# Patient Record
Sex: Male | Born: 1960
Health system: Southern US, Community
[De-identification: ages and names within clinical notes are randomized; demographics above are authoritative.]

## PROBLEM LIST (undated history)

## (undated) DIAGNOSIS — I10 Essential (primary) hypertension: Secondary | ICD-10-CM

## (undated) DIAGNOSIS — M7500 Adhesive capsulitis of unspecified shoulder: Secondary | ICD-10-CM

## (undated) DIAGNOSIS — E7800 Pure hypercholesterolemia, unspecified: Secondary | ICD-10-CM

## (undated) HISTORY — DX: Essential (primary) hypertension: I10

## (undated) HISTORY — PX: KNEE SURGERY: SHX244

## (undated) HISTORY — DX: Adhesive capsulitis of unspecified shoulder: M75.00

## (undated) HISTORY — DX: Pure hypercholesterolemia, unspecified: E78.00

## (undated) HISTORY — PX: APPENDECTOMY: SHX54

---

## 2004-12-27 ENCOUNTER — Ambulatory Visit: Payer: Self-pay | Admitting: Orthopedic Surgery

## 2005-07-08 ENCOUNTER — Ambulatory Visit: Payer: Self-pay | Admitting: Orthopedic Surgery

## 2010-10-09 ENCOUNTER — Encounter (INDEPENDENT_AMBULATORY_CARE_PROVIDER_SITE_OTHER): Payer: BC Managed Care – PPO | Admitting: Vascular Surgery

## 2010-10-09 ENCOUNTER — Encounter (INDEPENDENT_AMBULATORY_CARE_PROVIDER_SITE_OTHER): Payer: BC Managed Care – PPO

## 2010-10-09 DIAGNOSIS — R609 Edema, unspecified: Secondary | ICD-10-CM

## 2010-10-09 DIAGNOSIS — I70219 Atherosclerosis of native arteries of extremities with intermittent claudication, unspecified extremity: Secondary | ICD-10-CM

## 2010-10-10 NOTE — Consult Note (Signed)
NEW PATIENT CONSULTATION  Derk, Doubek Donovyn DOB:  1961-08-04                                       10/09/2010 YNWGN#:[id number]  Patient is a 50 year old male referred for evaluation of possible venous disease in both lower extremities, left worse than right.  This patient states that his left leg began swelling more about a year ago with no particular episode of trauma or other circumstances preceding this.  He has no history of DVT, superficial thrombophlebitis, varicose veins, but did have an ulceration develop in the lateral aspect of his leg about 10 months ago, treated by topical treatment.  I am unclear about exactly what the treatment consisted of, but shortly after that, the patient began developing thickening of the skin and a progressive edema, which has continued over the last several months.  He has not had previous vascular evaluation, has no history of DVT, superficial thrombophlebitis, pulmonary emboli, or other medical problems.  CHRONIC MEDICAL PROBLEMS: 1. Obesity. 2. Hypertension. 3. Hyperlipidemia. 4. Type 1 diabetes mellitus. 5. Arthritis, left knee. 6. Negative for coronary artery disease or stroke.  SOCIAL HISTORY:  He is married and has 1 child.  Does not use tobacco or alcohol.  FAMILY HISTORY:  Positive for diabetes in his mother.  Negative for coronary artery disease and stroke.  REVIEW OF SYSTEMS:  Positive for arthritis in his right knee.  Negative for chest pain, dyspnea on exertion, PND, orthopnea.  No chronic bronchitis, productive cough, wheezing.  All other systems in his complete review of systems are negative.  PHYSICAL EXAMINATION:  Blood pressure 164/94, heart rate 82, respirations 24.  General:  He is an obese, middle-aged male who is no apparent distress, alert and oriented x3.  HEENT:  Normal for age.  EOMs intact.  Lungs:  Clear to auscultation.  No rhonchi or wheezing. Cardiovascular:  Regular rhythm.  No murmurs.   Carotid pulses are 3+. No audible bruits.  Abdomen:  Soft, nontender with no masses. Musculoskeletal:  Free of major deformities.  Neurologic:  Normal.  Skin exam reveals a very unusual, thickened, hypertrophic rash in the left leg beginning in the distal thigh laterally and extending circumferentially down to just proximal to the ankle.  No stasis ulcers are noted.  The right leg has edema and thickening but to a much less degree than the left.  The hypertrophic changes in the skin are not present on the right side but are severely present on the left, and the skin is very firm.  He has intact femoral, popliteal, and dorsalis pedis pulses are 3+ bilaterally.  Circumference of the left leg is 6 cm greater than the circumference of the right leg below the knee.  Today I ordered a venous duplex exam which reveals no evidence of DVT bilaterally and no evidence of any incompetency of the great saphenous veins bilaterally.  I do not know the etiology of this skin change, which is severe on the left side, but he has no evidence of venous insufficiency or arterial insufficiency.  I think he should be evaluated very thoroughly by a dermatologist, as this appears that it could be some type of contact dermatitis which has severe skin changes, although I have never seen skin changes quite like this.  I have recommended dermatologic evaluation.    Quita Skye Hart Rochester, M.D. Electronically Signed  JDL/MEDQ  D:  10/09/2010  T:  10/10/2010  Job:  [id number]  cc:   Ernestina Penna, M.D. Paulene Floor, NP

## 2010-10-16 NOTE — Procedures (Unsigned)
DUPLEX DEEP VENOUS EXAM - LOWER EXTREMITY  INDICATION:  Vascular changes to lower extremities with edema.  HISTORY:  Edema:  Yes. Trauma/Surgery:  No. Pain:  No. PE:  No. Previous DVT:  No. Anticoagulants:  No. Other:  DUPLEX EXAM:               CFV   SFV   PopV  PTV    GSV               R  L  R  L  R  L  R   L  R  L Thrombosis    o  o  o  o  o  o         o  o Spontaneous   +  +  +  +  +  +         +  + Phasic        +  +  +  +  +  +         +  + Augmentation  +  +  +  +  +  +         +  + Compressible  +  +  +  +  +  +         +  + Competent     +  +  +  +  +  +         +  +  Legend:  + - yes  o - no  p - partial  D - decreased   IMPRESSION: 1. No evidence of lower extremity deep venous thrombosis bilaterally. 2. Unable to image bilateral distal thighs and calves due to edema,     lichenification and patient body habitus. 3. No evidence of greater saphenous vein incompetency bilaterally.        _____________________________ Quita Skye Hart [place], M.D.  EM/MEDQ  D:  10/09/2010  T:  10/09/2010  Job:  [id number]

## 2011-05-20 ENCOUNTER — Telehealth: Payer: Self-pay

## 2011-05-20 NOTE — Telephone Encounter (Signed)
LMOM for pt to call. (Needs OV first )

## 2011-05-21 NOTE — Telephone Encounter (Signed)
LMOM to call. Mailing a letter for pt to call and get appt for OV first.

## 2011-05-28 ENCOUNTER — Ambulatory Visit: Payer: BC Managed Care – PPO | Admitting: Urgent Care

## 2011-05-30 ENCOUNTER — Encounter: Payer: Self-pay | Admitting: Gastroenterology

## 2011-05-30 ENCOUNTER — Ambulatory Visit (INDEPENDENT_AMBULATORY_CARE_PROVIDER_SITE_OTHER): Payer: BC Managed Care – PPO | Admitting: Gastroenterology

## 2011-05-30 VITALS — BP 147/87 | HR 85 | Temp 97.9°F | Ht 73.0 in | Wt >= 6400 oz

## 2011-05-30 DIAGNOSIS — Z1211 Encounter for screening for malignant neoplasm of colon: Secondary | ICD-10-CM

## 2011-05-30 NOTE — Progress Notes (Signed)
Cc to PCP 

## 2011-05-30 NOTE — Patient Instructions (Addendum)
We have set you up for a colonoscopy with Dr. Jena Gauss.  Further recommendations to follow when this is completed.   Take a 1/2 dose of Lantus the evening before the procedure. Do not take Metformin the morning of procedure.

## 2011-05-30 NOTE — Progress Notes (Signed)
Referring Provider: Monica Becton, MD Primary Care Physician:  Monica Becton, MD, MD Primary Gastroenterologist:  Dr. Jena Gauss  Chief Complaint  Patient presents with  . Colonoscopy    HPI:   Jon Moore presents today for an initial screening colonoscopy. He is quite nervous about this procedure, but he has had several surgical procedures in the past. His BMI is approximately 55. He denies abdominal pain. No change in bowel habits. No melena or hematochezia. No nausea or vomiting. No prior FH of colon cancer. No upper GI symptoms.    Past Medical History  Diagnosis Date  . Hypertension   . Diabetes mellitus   . Hypercholesterolemia     Past Surgical History  Procedure Date  . Appendectomy   . Knee surgery     right    Current Outpatient Prescriptions  Medication Sig Dispense Refill  . amLODipine-olmesartan (AZOR) 10-40 MG per tablet Take 1 tablet by mouth daily.        . carvedilol (COREG) 25 MG tablet Take 25 mg by mouth 2 (two) times daily with a meal.        . cloNIDine (CATAPRES) 0.1 MG tablet Take 0.1 mg by mouth 2 (two) times daily.        . furosemide (LASIX) 80 MG tablet Take 80 mg by mouth 2 (two) times daily.        . insulin glargine (LANTUS) 100 UNIT/ML injection Inject 80 Units into the skin at bedtime.        . metFORMIN (GLUCOPHAGE) 1000 MG tablet Take 1,000 mg by mouth 2 (two) times daily with a meal.        . rosuvastatin (CRESTOR) 5 MG tablet Take 5 mg by mouth daily.          Allergies as of 05/30/2011  . (No Known Allergies)    Family History  Problem Relation Age of Onset  . Colon cancer Neg Hx     History   Social History  . Marital Status: Married    Spouse Name: N/A    Number of Children: 1  . Years of Education: N/A   Occupational History  . [employer]    Social History Main Topics  . Smoking status: Never Smoker   . Smokeless tobacco: Not on file  . Alcohol Use: No  . Drug Use: No  .  Sexually Active: Not on file   Other Topics Concern  . Not on file   Social History Narrative  . No narrative on file    Review of Systems: Gen: Denies any fever, chills, loss of appetite, fatigue, weight loss. CV: Denies chest pain, heart palpitations, syncope, peripheral edema. Resp: Denies shortness of breath with rest, cough, wheezing GI: Denies dysphagia or odynophagia. Denies hematemesis, fecal incontinence, or jaundice.  GU : Denies urinary burning, urinary frequency, urinary incontinence.  MS: Denies joint pain, muscle weakness, cramps, limited movement Derm: Denies rash, itching, dry skin Psych: Denies depression, anxiety, confusion or memory loss  Heme: Denies bruising, bleeding, and enlarged lymph nodes.  Physical Exam: BP 147/87  Pulse 85  Temp(Src) 97.9 F (36.6 C) (Temporal)  Ht 6\' 1"  (1.854 m)  Wt 414 lb 3.2 oz (187.88 kg)  BMI 54.65 kg/m2 General:   Alert and oriented. Well-developed, well-nourished, pleasant and cooperative. Obese.  Head:  Normocephalic and atraumatic. Eyes:  Conjunctiva pink, sclera clear, no icterus.   Ears:  Normal auditory acuity. Nose:  No deformity,  discharge,  or lesions. Mouth:  No deformity or lesions, mucosa pink and moist.  Neck:  Supple, without mass or thyromegaly. Lungs:  Clear to auscultation bilaterally, without wheezing, rales, or rhonchi.  Heart:  S1, S2 present without murmurs noted.  Abdomen:  Largely obese, large AP diameter. Difficult to appreciate HSM due to body habitus. +BS, soft, non-tender and non-distended. No palpable hernias noted. Rectal:  Deferred  Msk:  Symmetrical without gross deformities. Normal posture. Extremities:  Without clubbing or edema. Neurologic:  Alert and  oriented x4;  grossly normal neurologically. Skin:  Intact, warm and dry without significant lesions or rashes Cervical Nodes:  No significant cervical adenopathy. Psych:  Alert and cooperative. Normal mood and affect.

## 2011-05-30 NOTE — Assessment & Plan Note (Signed)
50 year old pleasant male with need for initial screening colonoscopy. He is nervous about the procedure, specifically anesthesia. He has no lower GI symptoms at all. No FH of colon cancer. Due to his large body habitus and a BMI of nearly 55, I feel it is best he is done in the OR with the assistance of Propofol. It is likely he would need vigilant airway monitoring due to body habitus, and I discussed this with him. He stated understanding.  Proceed with TCS with Dr. Jena Gauss with the assistance of Propofol (BMI 55)  in near future: the risks, benefits, and alternatives have been discussed with the patient in detail. The patient states understanding and desires to proceed. 1/2 dose Lantus evening prior No Glucophage morning of procedure

## 2011-08-20 DIAGNOSIS — M7500 Adhesive capsulitis of unspecified shoulder: Secondary | ICD-10-CM

## 2011-08-20 HISTORY — DX: Adhesive capsulitis of unspecified shoulder: M75.00

## 2011-11-20 DIAGNOSIS — J9600 Acute respiratory failure, unspecified whether with hypoxia or hypercapnia: Secondary | ICD-10-CM | POA: Insufficient documentation

## 2011-11-20 DIAGNOSIS — Z794 Long term (current) use of insulin: Secondary | ICD-10-CM | POA: Insufficient documentation

## 2011-11-20 DIAGNOSIS — I878 Other specified disorders of veins: Secondary | ICD-10-CM | POA: Insufficient documentation

## 2011-11-20 DIAGNOSIS — E119 Type 2 diabetes mellitus without complications: Secondary | ICD-10-CM | POA: Insufficient documentation

## 2011-11-20 DIAGNOSIS — I10 Essential (primary) hypertension: Secondary | ICD-10-CM | POA: Insufficient documentation

## 2011-11-20 DIAGNOSIS — N179 Acute kidney failure, unspecified: Secondary | ICD-10-CM | POA: Insufficient documentation

## 2011-11-20 DIAGNOSIS — E1169 Type 2 diabetes mellitus with other specified complication: Secondary | ICD-10-CM | POA: Insufficient documentation

## 2011-11-20 DIAGNOSIS — L02419 Cutaneous abscess of limb, unspecified: Secondary | ICD-10-CM | POA: Insufficient documentation

## 2011-11-20 DIAGNOSIS — I152 Hypertension secondary to endocrine disorders: Secondary | ICD-10-CM | POA: Insufficient documentation

## 2011-11-20 DIAGNOSIS — E1122 Type 2 diabetes mellitus with diabetic chronic kidney disease: Secondary | ICD-10-CM | POA: Insufficient documentation

## 2011-11-21 DIAGNOSIS — E46 Unspecified protein-calorie malnutrition: Secondary | ICD-10-CM | POA: Insufficient documentation

## 2011-11-24 DIAGNOSIS — R609 Edema, unspecified: Secondary | ICD-10-CM | POA: Insufficient documentation

## 2011-11-28 DIAGNOSIS — A419 Sepsis, unspecified organism: Secondary | ICD-10-CM | POA: Insufficient documentation

## 2012-07-21 ENCOUNTER — Ambulatory Visit: Payer: BC Managed Care – PPO | Attending: Orthopedic Surgery | Admitting: Physical Therapy

## 2012-07-21 DIAGNOSIS — IMO0001 Reserved for inherently not codable concepts without codable children: Secondary | ICD-10-CM | POA: Insufficient documentation

## 2012-07-21 DIAGNOSIS — M25519 Pain in unspecified shoulder: Secondary | ICD-10-CM | POA: Insufficient documentation

## 2012-07-21 DIAGNOSIS — M25619 Stiffness of unspecified shoulder, not elsewhere classified: Secondary | ICD-10-CM | POA: Insufficient documentation

## 2012-07-21 DIAGNOSIS — R5381 Other malaise: Secondary | ICD-10-CM | POA: Insufficient documentation

## 2012-07-23 ENCOUNTER — Ambulatory Visit: Payer: BC Managed Care – PPO | Admitting: Physical Therapy

## 2012-07-27 ENCOUNTER — Ambulatory Visit: Payer: BC Managed Care – PPO | Admitting: *Deleted

## 2012-07-28 ENCOUNTER — Ambulatory Visit: Payer: BC Managed Care – PPO | Admitting: Physical Therapy

## 2012-07-30 ENCOUNTER — Ambulatory Visit: Payer: BC Managed Care – PPO | Admitting: Physical Therapy

## 2012-08-04 ENCOUNTER — Ambulatory Visit: Payer: BC Managed Care – PPO | Admitting: Physical Therapy

## 2012-08-06 ENCOUNTER — Ambulatory Visit: Payer: BC Managed Care – PPO | Admitting: Physical Therapy

## 2012-08-10 ENCOUNTER — Ambulatory Visit: Payer: BC Managed Care – PPO | Admitting: *Deleted

## 2012-08-13 ENCOUNTER — Ambulatory Visit: Payer: BC Managed Care – PPO | Admitting: Physical Therapy

## 2012-08-25 ENCOUNTER — Ambulatory Visit: Payer: BC Managed Care – PPO | Attending: Orthopedic Surgery | Admitting: Physical Therapy

## 2012-08-25 DIAGNOSIS — R5381 Other malaise: Secondary | ICD-10-CM | POA: Insufficient documentation

## 2012-08-25 DIAGNOSIS — M25519 Pain in unspecified shoulder: Secondary | ICD-10-CM | POA: Insufficient documentation

## 2012-08-25 DIAGNOSIS — M25619 Stiffness of unspecified shoulder, not elsewhere classified: Secondary | ICD-10-CM | POA: Insufficient documentation

## 2012-08-25 DIAGNOSIS — IMO0001 Reserved for inherently not codable concepts without codable children: Secondary | ICD-10-CM | POA: Insufficient documentation

## 2012-08-27 ENCOUNTER — Ambulatory Visit: Payer: BC Managed Care – PPO | Admitting: Physical Therapy

## 2012-09-01 ENCOUNTER — Ambulatory Visit: Payer: BC Managed Care – PPO | Admitting: Physical Therapy

## 2012-09-03 ENCOUNTER — Ambulatory Visit: Payer: BC Managed Care – PPO | Admitting: Physical Therapy

## 2012-09-08 ENCOUNTER — Ambulatory Visit: Payer: BC Managed Care – PPO | Admitting: Physical Therapy

## 2012-09-10 ENCOUNTER — Ambulatory Visit: Payer: BC Managed Care – PPO | Admitting: Physical Therapy

## 2012-09-15 ENCOUNTER — Ambulatory Visit: Payer: BC Managed Care – PPO | Admitting: Physical Therapy

## 2012-09-17 ENCOUNTER — Ambulatory Visit: Payer: BC Managed Care – PPO | Admitting: Physical Therapy

## 2013-01-02 ENCOUNTER — Other Ambulatory Visit: Payer: Self-pay | Admitting: Physician Assistant

## 2013-01-13 ENCOUNTER — Ambulatory Visit (INDEPENDENT_AMBULATORY_CARE_PROVIDER_SITE_OTHER): Payer: BC Managed Care – PPO | Admitting: Physician Assistant

## 2013-01-13 ENCOUNTER — Encounter: Payer: Self-pay | Admitting: Physician Assistant

## 2013-01-13 VITALS — BP 158/84 | HR 74 | Temp 99.2°F | Ht 74.0 in | Wt >= 6400 oz

## 2013-01-13 DIAGNOSIS — E119 Type 2 diabetes mellitus without complications: Secondary | ICD-10-CM

## 2013-01-13 DIAGNOSIS — R600 Localized edema: Secondary | ICD-10-CM

## 2013-01-13 DIAGNOSIS — I10 Essential (primary) hypertension: Secondary | ICD-10-CM

## 2013-01-13 DIAGNOSIS — M25519 Pain in unspecified shoulder: Secondary | ICD-10-CM

## 2013-01-13 DIAGNOSIS — R609 Edema, unspecified: Secondary | ICD-10-CM

## 2013-01-13 DIAGNOSIS — M25511 Pain in right shoulder: Secondary | ICD-10-CM

## 2013-01-13 NOTE — Progress Notes (Signed)
Subjective:     Patient ID: Jon Moore, male   DOB: 14-Jun-1961, 52 y.o.   MRN: [id number]  HPI Pt here for review of frozen R shoulder Pt had extensive lower ext cellulitis and almost had BKA to the R leg During a prolonged hosp stay pt had frozen R shoulder Since d/c he has seen PT and completed course with them He has cont with ROM and strengthening exercises at home and is doing well. States he almost has full function back Pt has returned to work He continues with lower ext  edema but is wearing his TED hose as instructed  Review of Systems  All other systems reviewed and are negative.      Objective:   Physical Exam    Pt with almost FROM of the R shoulder No TTP of the entire joint FROM elbow/wrist Good grip strength Pulses/sensory good R lower ext with cont edema but pt wearing TED hose  Assessment:     1. Pain in joint, shoulder region, right   2. Lower extremity edema        Plan:     Cont with home therapy Cont with TED hose Keep regular f/u regarding medical conditions

## 2013-01-13 NOTE — Patient Instructions (Signed)

## 2013-02-08 ENCOUNTER — Other Ambulatory Visit: Payer: Self-pay | Admitting: *Deleted

## 2013-02-08 MED ORDER — ROSUVASTATIN CALCIUM 5 MG PO TABS: 5.0000 mg | ORAL_TABLET | Freq: Every day | ORAL | Status: DC

## 2013-03-30 ENCOUNTER — Ambulatory Visit: Payer: BC Managed Care – PPO | Admitting: Family Medicine

## 2013-04-13 ENCOUNTER — Telehealth: Payer: Self-pay | Admitting: Family Medicine

## 2013-04-13 ENCOUNTER — Ambulatory Visit (INDEPENDENT_AMBULATORY_CARE_PROVIDER_SITE_OTHER): Payer: BC Managed Care – PPO | Admitting: Family Medicine

## 2013-04-13 ENCOUNTER — Other Ambulatory Visit: Payer: Self-pay | Admitting: Family Medicine

## 2013-04-13 ENCOUNTER — Encounter: Payer: Self-pay | Admitting: Family Medicine

## 2013-04-13 VITALS — BP 179/99 | HR 76 | Temp 98.1°F | Ht 74.0 in | Wt >= 6400 oz

## 2013-04-13 DIAGNOSIS — E785 Hyperlipidemia, unspecified: Secondary | ICD-10-CM

## 2013-04-13 DIAGNOSIS — E119 Type 2 diabetes mellitus without complications: Secondary | ICD-10-CM

## 2013-04-13 DIAGNOSIS — I10 Essential (primary) hypertension: Secondary | ICD-10-CM

## 2013-04-13 DIAGNOSIS — R609 Edema, unspecified: Secondary | ICD-10-CM

## 2013-04-13 LAB — POCT CBC
Granulocyte percent: 61.2 %G (ref 37–80)
HCT, POC: 41.4 % — AB (ref 43.5–53.7)
Hemoglobin: 14 g/dL — AB (ref 14.1–18.1)
Lymph, poc: 2.5 (ref 0.6–3.4)
MCH, POC: 28.5 pg (ref 27–31.2)
MCHC: 33.7 g/dL (ref 31.8–35.4)
MCV: 78.5 fL — AB (ref 80–97)
MPV: 9 fL (ref 0–99.8)
POC Granulocyte: 4.7 (ref 2–6.9)
POC LYMPH PERCENT: 33.4 %L (ref 10–50)
Platelet Count, POC: 220 10*3/uL (ref 142–424)
RBC: 5.3 M/uL (ref 4.69–6.13)
RDW, POC: 14.9 %
WBC: 7.6 10*3/uL (ref 4.6–10.2)

## 2013-04-13 LAB — POCT UA - MICROALBUMIN: Microalbumin Ur, POC: NEGATIVE mg/L

## 2013-04-13 MED ORDER — CLONIDINE HCL 0.1 MG PO TABS: 0.1000 mg | ORAL_TABLET | Freq: Two times a day (BID) | ORAL | Status: DC

## 2013-04-13 MED ORDER — ROSUVASTATIN CALCIUM 5 MG PO TABS: 5.0000 mg | ORAL_TABLET | Freq: Every day | ORAL | Status: DC

## 2013-04-13 MED ORDER — INSULIN GLARGINE 100 UNITS/ML SOLOSTAR PEN: 70.0000 [IU] | PEN_INJECTOR | Freq: Every day | SUBCUTANEOUS | Status: DC

## 2013-04-13 MED ORDER — AMLODIPINE-OLMESARTAN 10-40 MG PO TABS: 1.0000 | ORAL_TABLET | Freq: Every day | ORAL | Status: DC

## 2013-04-13 MED ORDER — FUROSEMIDE 80 MG PO TABS: 80.0000 mg | ORAL_TABLET | Freq: Two times a day (BID) | ORAL | Status: DC

## 2013-04-13 MED ORDER — INSULIN GLARGINE 100 UNIT/ML ~~LOC~~ SOLN: 70.0000 [IU] | Freq: Every day | SUBCUTANEOUS | Status: DC

## 2013-04-13 MED ORDER — CARVEDILOL 25 MG PO TABS: 25.0000 mg | ORAL_TABLET | Freq: Two times a day (BID) | ORAL | Status: DC

## 2013-04-13 MED ORDER — METFORMIN HCL 1000 MG PO TABS: 1000.0000 mg | ORAL_TABLET | Freq: Two times a day (BID) | ORAL | Status: DC

## 2013-04-13 NOTE — Telephone Encounter (Signed)
Can we please fix he needs the pens called in

## 2013-04-13 NOTE — Patient Instructions (Signed)

## 2013-04-13 NOTE — Telephone Encounter (Signed)
solostar pens sent to [facility]

## 2013-04-13 NOTE — Progress Notes (Signed)
  Subjective:    Patient ID: Carrick Rijos, male    DOB: 1960-10-06, 52 y.o.   MRN: [id number]  HPI This 52 y.o. male presents for evaluation of diabetes, hypertension, edema, and hyperlipidemia. He has problems with obesity.  He was hospitalized last year due to cellulitis of the left lower Extremity, aspiration pneumonia, and sepsis.  He had respiratory failure and was on the  Ventilator 5 days.  He has been doing a lot better and his skin grafts on the right lower extremity Have healed.  He has been out of bp medicine for 2 days.  He states he has gained 45 pounds this year. He is speaking to a nutritionist through his insurance company with [employer].  He Denies any acute medical problems.   Review of Systems C/o edema bilateral lower extremities.   No chest pain, SOB, HA, dizziness, vision change, N/V, diarrhea, constipation, dysuria, urinary urgency or frequency, myalgias, arthralgias or rash.  Objective:   Physical Exam Vital signs noted  Well developed well nourished obese AA male.  HEENT - Head atraumatic Normocephalic                Eyes - PERRLA, Conjuctiva - clear Sclera- Clear EOMI                Ears - EAC's Wnl TM's Wnl Gross Hearing WNL                Nose - Nares patent                 Throat - oropharanx wnl Respiratory - Lungs CTA bilateral Cardiac - RRR S1 and S2 without murmur GI - Abdomen obese, soft, Nontender and bowel sounds active x 4 Extremities - Right lower extremity with healed skin grafts, anasarca edema bilateral lower  Extremities. Neuro - Grossly intact.        Assessment & Plan:  Diabetes - Plan: metFORMIN (GLUCOPHAGE) 1000 MG tablet, insulin glargine (LANTUS) 100 UNIT/ML injection, POCT CBC, CMP14+EGFR, Lipid panel, Thyroid Panel With TSH, POCT UA - Microalbumin  Essential hypertension, benign - Plan: cloNIDine (CATAPRES) 0.1 MG tablet, carvedilol (COREG) 25 MG tablet, amLODipine-olmesartan (AZOR) 10-40 MG per  tablet  Edema - Plan: furosemide (LASIX) 80 MG tablet, 40mg  bid, continue compression stockings.  Other and unspecified hyperlipidemia - Plan: rosuvastatin (CRESTOR) 5 MG tablet  Follow up in 4 months.

## 2013-04-14 LAB — CMP14+EGFR
ALT: 20 IU/L (ref 0–44)
AST: 18 IU/L (ref 0–40)
Albumin/Globulin Ratio: 1.3 (ref 1.1–2.5)
Albumin: 4.4 g/dL (ref 3.5–5.5)
Alkaline Phosphatase: 126 IU/L — ABNORMAL HIGH (ref 39–117)
BUN/Creatinine Ratio: 12 (ref 9–20)
BUN: 16 mg/dL (ref 6–24)
CO2: 31 mmol/L — ABNORMAL HIGH (ref 18–29)
Calcium: 9.8 mg/dL (ref 8.7–10.2)
Chloride: 100 mmol/L (ref 97–108)
Creatinine, Ser: 1.38 mg/dL — ABNORMAL HIGH (ref 0.76–1.27)
GFR calc Af Amer: 67 mL/min/{1.73_m2} (ref >59)
GFR calc non Af Amer: 58 mL/min/{1.73_m2} — ABNORMAL LOW (ref >59)
Globulin, Total: 3.5 g/dL (ref 1.5–4.5)
Glucose: 97 mg/dL (ref 65–99)
Potassium: 4.9 mmol/L (ref 3.5–5.2)
Sodium: 144 mmol/L (ref 134–144)
Total Bilirubin: 0.4 mg/dL (ref 0.0–1.2)
Total Protein: 7.9 g/dL (ref 6.0–8.5)

## 2013-04-14 LAB — THYROID PANEL WITH TSH
Free Thyroxine Index: 2.1 (ref 1.2–4.9)
T3 Uptake Ratio: 37 % (ref 24–39)
T4, Total: 5.8 ug/dL (ref 4.5–12.0)
TSH: 1.65 u[IU]/mL (ref 0.450–4.500)

## 2013-04-14 LAB — LIPID PANEL
Chol/HDL Ratio: 4 ratio units (ref 0.0–5.0)
Cholesterol, Total: 135 mg/dL (ref 100–199)
HDL: 34 mg/dL — ABNORMAL LOW (ref >39)
LDL Calculated: 70 mg/dL (ref 0–99)
Triglycerides: 153 mg/dL — ABNORMAL HIGH (ref 0–149)
VLDL Cholesterol Cal: 31 mg/dL (ref 5–40)

## 2013-04-15 ENCOUNTER — Telehealth: Payer: Self-pay | Admitting: Family Medicine

## 2013-04-16 NOTE — Telephone Encounter (Signed)
Taken care of in another encounter 

## 2013-04-20 ENCOUNTER — Telehealth: Payer: Self-pay | Admitting: Family Medicine

## 2013-04-20 ENCOUNTER — Encounter: Payer: Self-pay | Admitting: Family Medicine

## 2013-04-20 NOTE — Telephone Encounter (Signed)
Did you order an a1c on him?

## 2013-04-22 ENCOUNTER — Telehealth: Payer: Self-pay | Admitting: Family Medicine

## 2013-04-22 LAB — POCT GLYCOSYLATED HEMOGLOBIN (HGB A1C): Hemoglobin A1C: 7.7

## 2013-04-22 NOTE — Telephone Encounter (Signed)
hgbaic results are 7.7 and need to get down to 7.0 so would recommend he follow up with Tammy Eckerd  Pharm D to do diabetes visit.

## 2013-04-22 NOTE — Addendum Note (Signed)
Addended by: Prescott Gum on: 04/22/2013 09:00 AM   Modules accepted: Orders

## 2013-04-22 NOTE — Telephone Encounter (Signed)
Patient aware.

## 2013-04-29 NOTE — Telephone Encounter (Signed)
Pt needs rx for stockings.

## 2013-05-04 ENCOUNTER — Telehealth: Payer: Self-pay | Admitting: *Deleted

## 2013-05-04 NOTE — Telephone Encounter (Signed)
Jon Moore says you told him to take Carvedilol bid but only called in 30 for his monthly supply.  If he needs bid, can you send correct script to pharmacy for #60?

## 2013-05-05 ENCOUNTER — Other Ambulatory Visit: Payer: Self-pay | Admitting: Family Medicine

## 2013-05-05 DIAGNOSIS — I10 Essential (primary) hypertension: Secondary | ICD-10-CM

## 2013-05-05 MED ORDER — CARVEDILOL 25 MG PO TABS: 25.0000 mg | ORAL_TABLET | Freq: Two times a day (BID) | ORAL | Status: DC

## 2013-05-05 NOTE — Telephone Encounter (Signed)
rx fixed and sent to pharmacy

## 2013-05-26 ENCOUNTER — Telehealth: Payer: Self-pay | Admitting: Family Medicine

## 2013-05-26 NOTE — Telephone Encounter (Signed)
Pt has already seen ortho for this Informed pt that he could schedule since he's already established

## 2013-06-24 ENCOUNTER — Other Ambulatory Visit: Payer: Self-pay

## 2013-07-20 ENCOUNTER — Encounter: Payer: Self-pay | Admitting: Family Medicine

## 2013-07-20 ENCOUNTER — Ambulatory Visit (INDEPENDENT_AMBULATORY_CARE_PROVIDER_SITE_OTHER): Payer: BC Managed Care – PPO | Admitting: Family Medicine

## 2013-07-20 VITALS — BP 140/82 | HR 72 | Temp 96.8°F | Ht 74.0 in | Wt >= 6400 oz

## 2013-07-20 DIAGNOSIS — E119 Type 2 diabetes mellitus without complications: Secondary | ICD-10-CM

## 2013-07-20 DIAGNOSIS — I10 Essential (primary) hypertension: Secondary | ICD-10-CM

## 2013-07-20 LAB — POCT GLYCOSYLATED HEMOGLOBIN (HGB A1C): Hemoglobin A1C: 7.2

## 2013-07-20 NOTE — Patient Instructions (Signed)

## 2013-07-20 NOTE — Progress Notes (Signed)
   Subjective:    Patient ID: Jon Moore, male    DOB: [birth year], 52 y.o.   MRN: [id number]  HPI  This 52 y.o. male presents for evaluation of diabetes, hypertension, and hyperlipidemia. He has lost 30 pounds since last visit.  He has been controlling his diet.  He has been Seeing orthopedics for DJD of the knees.  His left is worse than his right and he Has had to cut back on work.  He is trying to avoid getting knee replacement as long as He can.  Review of Systems C/o left knee arthralgias.   No chest pain, SOB, HA, dizziness, vision change, N/V, diarrhea, constipation, dysuria, urinary urgency or frequency or rash.  Objective:   Physical Exam  Vital signs noted  Well developed well nourished obese male.  HEENT - Head atraumatic Normocephalic                Eyes - PERRLA, Conjuctiva - clear Sclera- Clear EOMI                Ears - EAC's Wnl TM's Wnl Gross Hearing WNL                Nose - Nares patent                 Throat - oropharanx wnl Respiratory - Lungs CTA bilateral Cardiac - RRR S1 and S2 without murmur GI - Abdomen soft Nontender and bowel sounds active x 4 Extremities - No edema. Neuro - Grossly intact.  Results for orders placed in visit on 07/20/13  POCT GLYCOSYLATED HEMOGLOBIN (HGB A1C)      Result Value Range   Hemoglobin A1C 7.2        Assessment & Plan:  HTN (hypertension) - Plan: CMP14+EGFR, Lipid panel  Diabetes - Plan: POCT glycosylated hemoglobin (Hb A1C), continue to follow diabetic diet And congrats on losing weight.  Hgbaic is slightly lower and this is an improvement.  Deatra Canter FNP

## 2013-07-21 ENCOUNTER — Telehealth: Payer: Self-pay | Admitting: *Deleted

## 2013-07-21 LAB — CMP14+EGFR
ALT: 20 IU/L (ref 0–44)
AST: 20 IU/L (ref 0–40)
Albumin/Globulin Ratio: 1.2 (ref 1.1–2.5)
Albumin: 4.2 g/dL (ref 3.5–5.5)
Alkaline Phosphatase: 120 IU/L — ABNORMAL HIGH (ref 39–117)
BUN/Creatinine Ratio: 15 (ref 9–20)
BUN: 18 mg/dL (ref 6–24)
CO2: 29 mmol/L (ref 18–29)
Calcium: 9.7 mg/dL (ref 8.7–10.2)
Chloride: 100 mmol/L (ref 97–108)
Creatinine, Ser: 1.24 mg/dL (ref 0.76–1.27)
GFR calc Af Amer: 76 mL/min/{1.73_m2} (ref >59)
GFR calc non Af Amer: 66 mL/min/{1.73_m2} (ref >59)
Globulin, Total: 3.6 g/dL (ref 1.5–4.5)
Glucose: 88 mg/dL (ref 65–99)
Potassium: 4.4 mmol/L (ref 3.5–5.2)
Sodium: 144 mmol/L (ref 134–144)
Total Bilirubin: 0.5 mg/dL (ref 0.0–1.2)
Total Protein: 7.8 g/dL (ref 6.0–8.5)

## 2013-07-21 LAB — LIPID PANEL
Chol/HDL Ratio: 4.4 ratio units (ref 0.0–5.0)
Cholesterol, Total: 127 mg/dL (ref 100–199)
HDL: 29 mg/dL — ABNORMAL LOW (ref >39)
LDL Calculated: 66 mg/dL (ref 0–99)
Triglycerides: 159 mg/dL — ABNORMAL HIGH (ref 0–149)
VLDL Cholesterol Cal: 32 mg/dL (ref 5–40)

## 2013-07-21 NOTE — Telephone Encounter (Signed)
Message copied by Baltazar Apo on Wed Jul 21, 2013 11:01 AM ------      Message from: Deatra Canter      Created: Wed Jul 21, 2013 10:13 AM       Lipids look good, hgbaic is better at 7.2% but no below 7 so continue diet and exercise and weight lossp ------

## 2013-07-21 NOTE — Telephone Encounter (Signed)
Lm to call back

## 2013-08-09 ENCOUNTER — Telehealth: Payer: Self-pay | Admitting: *Deleted

## 2013-08-09 NOTE — Telephone Encounter (Signed)
Pt notified and verbalized understanding.

## 2013-08-09 NOTE — Telephone Encounter (Signed)
Message copied by Baltazar Apo on Mon Aug 09, 2013  2:00 PM ------      Message from: Deatra Canter      Created: Wed Jul 21, 2013 10:13 AM       Lipids look good, hgbaic is better at 7.2% but no below 7 so continue diet and exercise and weight lossp ------

## 2013-08-20 ENCOUNTER — Encounter: Payer: Self-pay | Admitting: Family Medicine

## 2013-08-24 ENCOUNTER — Encounter: Payer: Self-pay | Admitting: Family Medicine

## 2013-09-22 ENCOUNTER — Ambulatory Visit: Payer: BC Managed Care – PPO | Admitting: Family Medicine

## 2013-09-28 ENCOUNTER — Ambulatory Visit: Payer: BC Managed Care – PPO | Admitting: Family Medicine

## 2013-10-19 ENCOUNTER — Ambulatory Visit (INDEPENDENT_AMBULATORY_CARE_PROVIDER_SITE_OTHER): Payer: BC Managed Care – PPO | Admitting: Family Medicine

## 2013-10-19 ENCOUNTER — Ambulatory Visit: Payer: BC Managed Care – PPO | Admitting: Family Medicine

## 2013-10-19 ENCOUNTER — Encounter: Payer: Self-pay | Admitting: Family Medicine

## 2013-10-19 VITALS — BP 152/80 | HR 85 | Temp 97.7°F | Ht 74.0 in | Wt >= 6400 oz

## 2013-10-19 DIAGNOSIS — E785 Hyperlipidemia, unspecified: Secondary | ICD-10-CM

## 2013-10-19 DIAGNOSIS — E119 Type 2 diabetes mellitus without complications: Secondary | ICD-10-CM

## 2013-10-19 DIAGNOSIS — M129 Arthropathy, unspecified: Secondary | ICD-10-CM

## 2013-10-19 DIAGNOSIS — Z0000 Encounter for general adult medical examination without abnormal findings: Secondary | ICD-10-CM

## 2013-10-19 DIAGNOSIS — M1990 Unspecified osteoarthritis, unspecified site: Secondary | ICD-10-CM

## 2013-10-19 LAB — POCT CBC
Granulocyte percent: 68.7 %G (ref 37–80)
HCT, POC: 44.7 % (ref 43.5–53.7)
Hemoglobin: 14.5 g/dL (ref 14.1–18.1)
Lymph, poc: 2.2 (ref 0.6–3.4)
MCH, POC: 25.9 pg — AB (ref 27–31.2)
MCHC: 32.5 g/dL (ref 31.8–35.4)
MCV: 79.8 fL — AB (ref 80–97)
MPV: 9.6 fL (ref 0–99.8)
POC Granulocyte: 5.3 (ref 2–6.9)
POC LYMPH PERCENT: 29.2 %L (ref 10–50)
Platelet Count, POC: 212 10*3/uL (ref 142–424)
RBC: 5.6 M/uL (ref 4.69–6.13)
RDW, POC: 15.5 %
WBC: 7.7 10*3/uL (ref 4.6–10.2)

## 2013-10-19 LAB — POCT GLYCOSYLATED HEMOGLOBIN (HGB A1C): Hemoglobin A1C: 8.1

## 2013-10-19 LAB — POCT UA - MICROALBUMIN: Microalbumin Ur, POC: NEGATIVE mg/L

## 2013-10-19 MED ORDER — MELOXICAM 15 MG PO TABS: 15.0000 mg | ORAL_TABLET | Freq: Every day | ORAL | Status: DC

## 2013-10-19 NOTE — Progress Notes (Signed)
   Subjective:    Patient ID: Diante Barley, male    DOB: 10-17-1960, 53 y.o.   MRN: [id number]  HPI This 53 y.o. male presents for evaluation of follow up on hypertension, diabetes, and obesity. He has been having some arthritis sx's.   Review of Systems C/o arthritis No chest pain, SOB, HA, dizziness, vision change, N/V, diarrhea, constipation, dysuria, urinary urgency or frequency, myalgias or rash.     Objective:   Physical Exam   Vital signs noted  Well developed well nourished male.  HEENT - Head atraumatic Normocephalic                Eyes - PERRLA, Conjuctiva - clear Sclera- Clear EOMI                Ears - EAC's Wnl TM's Wnl Gross Hearing WNL                Nose - Nares patent                 Throat - oropharanx wnl Respiratory - Lungs CTA bilateral Cardiac - RRR S1 and S2 without murmur GI - Abdomen soft Nontender and bowel sounds active x 4 Extremities - No edema. Neuro - Grossly intact.     Assessment & Plan:  Routine general medical examination at a health care facility - Plan: POCT CBC, POCT glycosylated hemoglobin (Hb A1C), CMP14+EGFR, PSA, total and free, Thyroid Panel With TSH, Vit D  25 hydroxy (rtn osteoporosis monitoring), POCT UA - Microalbumin, Lipid panel  Diabetes - Plan: POCT CBC, POCT glycosylated hemoglobin (Hb A1C), CMP14+EGFR  Other and unspecified hyperlipidemia - Plan: Lipid panel  Arthritis - Plan: meloxicam (MOBIC) 15 MG tablet  Lysbeth Penner FNP

## 2013-10-20 ENCOUNTER — Other Ambulatory Visit: Payer: Self-pay | Admitting: Family Medicine

## 2013-10-20 LAB — CMP14+EGFR
ALT: 20 IU/L (ref 0–44)
AST: 18 IU/L (ref 0–40)
Albumin/Globulin Ratio: 1.2 (ref 1.1–2.5)
Albumin: 4.1 g/dL (ref 3.5–5.5)
Alkaline Phosphatase: 144 IU/L — ABNORMAL HIGH (ref 39–117)
BUN/Creatinine Ratio: 18 (ref 9–20)
BUN: 24 mg/dL (ref 6–24)
CO2: 28 mmol/L (ref 18–29)
Calcium: 9.6 mg/dL (ref 8.7–10.2)
Chloride: 96 mmol/L — ABNORMAL LOW (ref 97–108)
Creatinine, Ser: 1.34 mg/dL — ABNORMAL HIGH (ref 0.76–1.27)
GFR calc Af Amer: 69 mL/min/{1.73_m2} (ref >59)
GFR calc non Af Amer: 60 mL/min/{1.73_m2} (ref >59)
Globulin, Total: 3.5 g/dL (ref 1.5–4.5)
Glucose: 187 mg/dL — ABNORMAL HIGH (ref 65–99)
Potassium: 4.5 mmol/L (ref 3.5–5.2)
Sodium: 139 mmol/L (ref 134–144)
Total Bilirubin: 0.4 mg/dL (ref 0.0–1.2)
Total Protein: 7.6 g/dL (ref 6.0–8.5)

## 2013-10-20 LAB — LIPID PANEL
Chol/HDL Ratio: 4.3 ratio units (ref 0.0–5.0)
Cholesterol, Total: 136 mg/dL (ref 100–199)
HDL: 32 mg/dL — ABNORMAL LOW (ref >39)
LDL Calculated: 70 mg/dL (ref 0–99)
Triglycerides: 171 mg/dL — ABNORMAL HIGH (ref 0–149)
VLDL Cholesterol Cal: 34 mg/dL (ref 5–40)

## 2013-10-20 LAB — THYROID PANEL WITH TSH
Free Thyroxine Index: 1.9 (ref 1.2–4.9)
T3 Uptake Ratio: 36 % (ref 24–39)
T4, Total: 5.3 ug/dL (ref 4.5–12.0)
TSH: 1.78 u[IU]/mL (ref 0.450–4.500)

## 2013-10-20 LAB — PSA, TOTAL AND FREE
PSA, Free Pct: 45 %
PSA, Free: 0.09 ng/mL
PSA: 0.2 ng/mL (ref 0.0–4.0)

## 2013-10-20 LAB — VITAMIN D 25 HYDROXY (VIT D DEFICIENCY, FRACTURES): Vit D, 25-Hydroxy: 17.1 ng/mL — ABNORMAL LOW (ref 30.0–100.0)

## 2014-01-06 ENCOUNTER — Other Ambulatory Visit: Payer: Self-pay | Admitting: Family Medicine

## 2014-01-06 ENCOUNTER — Telehealth: Payer: Self-pay | Admitting: Family Medicine

## 2014-01-06 NOTE — Telephone Encounter (Signed)
I don't rx him tylenol #3 he probably gets this from ortho. He will need to follow up for narcotic pain meds

## 2014-01-07 NOTE — Telephone Encounter (Signed)
Pt will call ortho. Dr. For tylenol #3 refill request.  rs

## 2014-01-11 ENCOUNTER — Telehealth: Payer: Self-pay | Admitting: Family Medicine

## 2014-01-12 NOTE — Telephone Encounter (Signed)
appt scheduled

## 2014-01-13 ENCOUNTER — Ambulatory Visit (INDEPENDENT_AMBULATORY_CARE_PROVIDER_SITE_OTHER): Payer: BC Managed Care – PPO | Admitting: Family Medicine

## 2014-01-13 ENCOUNTER — Encounter: Payer: Self-pay | Admitting: Family Medicine

## 2014-01-13 VITALS — BP 115/61 | HR 78 | Temp 98.5°F | Ht 74.0 in | Wt >= 6400 oz

## 2014-01-13 DIAGNOSIS — M549 Dorsalgia, unspecified: Secondary | ICD-10-CM

## 2014-01-13 MED ORDER — AMOXICILLIN 875 MG PO TABS: 875.0000 mg | ORAL_TABLET | Freq: Two times a day (BID) | ORAL | Status: DC

## 2014-01-13 MED ORDER — ACETAMINOPHEN-CODEINE #4 300-60 MG PO TABS: 1.0000 | ORAL_TABLET | ORAL | Status: DC | PRN

## 2014-01-13 NOTE — Progress Notes (Signed)
   Subjective:    Patient ID: Jon Moore, male    DOB: 06/13/1961, 53 y.o.   MRN: [id number]  HPI  C/o back pain in Lumbar region and severe pain.  This is from old injury he states and he uses Tylenol #3 in past which has helped.  He gets occasional pain going down his leg.  Review of Systems    No chest pain, SOB, HA, dizziness, vision change, N/V, diarrhea, constipation, dysuria, urinary urgency or frequency, myalgias, arthralgias or rash.  Objective:   Physical Exam Vital signs noted  Well developed well nourished obese male.  HEENT - Head atraumatic Normocephalic                Eyes - PERRLA, Conjuctiva - clear Sclera- Clear EOMI                Ears - EAC's Wnl TM's Wnl Gross Hearing WNL                Throat - oropharanx wnl Respiratory - Lungs CTA bilateral Cardiac - RRR S1 and S2 without murmur MS - TTP lumbar spine      Assessment & Plan:  Back pain - Plan: acetaminophen-codeine (TYLENOL #4) 300-60 MG per tablet Follow up prn  Deatra Canter FNP

## 2014-01-17 NOTE — Telephone Encounter (Signed)
Spoke with pt to inform Jon Moore handed the Rx for Tylenol #3 to pt at visit Pt didn't realize this was his RX He now verbalizes understanding

## 2014-02-01 ENCOUNTER — Ambulatory Visit: Payer: BC Managed Care – PPO | Admitting: Family Medicine

## 2014-02-05 ENCOUNTER — Encounter: Payer: Self-pay | Admitting: Family Medicine

## 2014-02-10 ENCOUNTER — Ambulatory Visit (INDEPENDENT_AMBULATORY_CARE_PROVIDER_SITE_OTHER): Payer: BC Managed Care – PPO | Admitting: Family Medicine

## 2014-02-10 ENCOUNTER — Encounter: Payer: Self-pay | Admitting: Family Medicine

## 2014-02-10 VITALS — BP 143/81 | HR 72 | Temp 97.6°F | Ht 74.0 in | Wt >= 6400 oz

## 2014-02-10 DIAGNOSIS — E119 Type 2 diabetes mellitus without complications: Secondary | ICD-10-CM

## 2014-02-10 DIAGNOSIS — J3089 Other allergic rhinitis: Secondary | ICD-10-CM

## 2014-02-10 DIAGNOSIS — E559 Vitamin D deficiency, unspecified: Secondary | ICD-10-CM

## 2014-02-10 DIAGNOSIS — E1165 Type 2 diabetes mellitus with hyperglycemia: Secondary | ICD-10-CM

## 2014-02-10 DIAGNOSIS — J302 Other seasonal allergic rhinitis: Secondary | ICD-10-CM

## 2014-02-10 DIAGNOSIS — R739 Hyperglycemia, unspecified: Secondary | ICD-10-CM

## 2014-02-10 DIAGNOSIS — J069 Acute upper respiratory infection, unspecified: Secondary | ICD-10-CM

## 2014-02-10 LAB — POCT UA - MICROALBUMIN: Microalbumin Ur, POC: 20 mg/L

## 2014-02-10 LAB — POCT CBC
Granulocyte percent: 64.7 %G (ref 37–80)
HCT, POC: 43 % — AB (ref 43.5–53.7)
Hemoglobin: 13.7 g/dL — AB (ref 14.1–18.1)
Lymph, poc: 2.5 (ref 0.6–3.4)
MCH, POC: 25.9 pg — AB (ref 27–31.2)
MCHC: 32 g/dL (ref 31.8–35.4)
MCV: 81 fL (ref 80–97)
MPV: 8.1 fL (ref 0–99.8)
POC Granulocyte: 5.6 (ref 2–6.9)
POC LYMPH PERCENT: 29.1 %L (ref 10–50)
Platelet Count, POC: 255 10*3/uL (ref 142–424)
RBC: 5.3 M/uL (ref 4.69–6.13)
RDW, POC: 13.8 %
WBC: 8.7 10*3/uL (ref 4.6–10.2)

## 2014-02-10 LAB — POCT GLYCOSYLATED HEMOGLOBIN (HGB A1C): Hemoglobin A1C: 7.3

## 2014-02-10 MED ORDER — CANAGLIFLOZIN 300 MG PO TABS: 300.0000 mg | ORAL_TABLET | ORAL | Status: DC

## 2014-02-10 MED ORDER — MOMETASONE FUROATE 50 MCG/ACT NA SUSP: 2.0000 | Freq: Every day | NASAL | Status: DC

## 2014-02-10 MED ORDER — AZITHROMYCIN 250 MG PO TABS: ORAL_TABLET | ORAL | Status: DC

## 2014-02-10 NOTE — Progress Notes (Signed)
   Subjective:    Patient ID: Jon Moore, male    DOB: 05/24/1961, 52 y.o.   MRN: [id number]  HPI This 52 y.o. male presents for evaluation of diabetes, hypertension, and c/o uri sx's. He has been having some congestion for over a week.  His hgbaic was 8.1% last visit.  He has joined the [employer] and has a personal trainer.  He wears oxygen at night due to desaturations from [facility].  He was rx'd this from [facility] after being hospitalized for cellulitis of the right Lower extremity.  He could not tolerate cpap.   He states he sleeps better and is not snoring as  Bad.  He denies sleepiness during driving.   Review of Systems C/o uri sx's   No chest pain, SOB, HA, dizziness, vision change, N/V, diarrhea, constipation, dysuria, urinary urgency or frequency, myalgias, arthralgias or rash.  Objective:   Physical Exam  Vital signs noted  Well developed well nourished male.  HEENT - Head atraumatic Normocephalic                Eyes - PERRLA, Conjuctiva - clear Sclera- Clear EOMI                Ears - EAC's Wnl TM's Wnl Gross Hearing WNL                Nose - Nares patent                 Throat - oropharanx wnl Respiratory - Lungs CTA bilateral Cardiac - RRR S1 and S2 without murmur GI - Abdomen soft Nontender and bowel sounds active x 4 Extremities - No edema. Neuro - Grossly intact.      Assessment & Plan:  Type 2 diabetes mellitus with hyperglycemia - Plan: POCT CBC, CMP14+EGFR, POCT glycosylated hemoglobin (Hb A1C), Canagliflozin (INVOKANA) 300 MG TABS, POCT UA - Microalbumin, TSH, Microalbumin, urine  Other seasonal allergic rhinitis - Plan: mometasone (NASONEX) 50 MCG/ACT nasal spray  URI (upper respiratory infection) - Plan: azithromycin (ZITHROMAX) 250 MG tablet  Unspecified vitamin D deficiency - Plan: Vit D  25 hydroxy (rtn osteoporosis monitoring)  Hyperglycemia - Plan: Lipid panel  Follow up in 3 months  William J Oxford FNP 

## 2014-02-11 ENCOUNTER — Other Ambulatory Visit: Payer: Self-pay | Admitting: Family Medicine

## 2014-02-11 LAB — LIPID PANEL
Chol/HDL Ratio: 4.2 ratio units (ref 0.0–5.0)
Cholesterol, Total: 117 mg/dL (ref 100–199)
HDL: 28 mg/dL — ABNORMAL LOW (ref >39)
LDL Calculated: 61 mg/dL (ref 0–99)
Triglycerides: 138 mg/dL (ref 0–149)
VLDL Cholesterol Cal: 28 mg/dL (ref 5–40)

## 2014-02-11 LAB — CMP14+EGFR
ALT: 24 IU/L (ref 0–44)
AST: 22 IU/L (ref 0–40)
Albumin/Globulin Ratio: 1 — ABNORMAL LOW (ref 1.1–2.5)
Albumin: 4 g/dL (ref 3.5–5.5)
Alkaline Phosphatase: 114 IU/L (ref 39–117)
BUN/Creatinine Ratio: 11 (ref 9–20)
BUN: 16 mg/dL (ref 6–24)
CO2: 30 mmol/L — ABNORMAL HIGH (ref 18–29)
Calcium: 9.5 mg/dL (ref 8.7–10.2)
Chloride: 97 mmol/L (ref 97–108)
Creatinine, Ser: 1.42 mg/dL — ABNORMAL HIGH (ref 0.76–1.27)
GFR calc Af Amer: 65 mL/min/{1.73_m2} (ref >59)
GFR calc non Af Amer: 56 mL/min/{1.73_m2} — ABNORMAL LOW (ref >59)
Globulin, Total: 3.9 g/dL (ref 1.5–4.5)
Glucose: 69 mg/dL (ref 65–99)
Potassium: 4.2 mmol/L (ref 3.5–5.2)
Sodium: 140 mmol/L (ref 134–144)
Total Bilirubin: 0.4 mg/dL (ref 0.0–1.2)
Total Protein: 7.9 g/dL (ref 6.0–8.5)

## 2014-02-11 LAB — MICROALBUMIN, URINE: Microalbumin, Urine: 57.3 ug/mL — ABNORMAL HIGH (ref 0.0–17.0)

## 2014-02-11 LAB — VITAMIN D 25 HYDROXY (VIT D DEFICIENCY, FRACTURES): Vit D, 25-Hydroxy: 18.1 ng/mL — ABNORMAL LOW (ref 30.0–100.0)

## 2014-02-11 LAB — TSH: TSH: 1.53 u[IU]/mL (ref 0.450–4.500)

## 2014-02-11 MED ORDER — VITAMIN D (ERGOCALCIFEROL) 1.25 MG (50000 UNIT) PO CAPS: 50000.0000 [IU] | ORAL_CAPSULE | ORAL | Status: DC

## 2014-02-11 MED ORDER — LISINOPRIL 5 MG PO TABS: 5.0000 mg | ORAL_TABLET | Freq: Every day | ORAL | Status: DC

## 2014-02-14 ENCOUNTER — Telehealth: Payer: Self-pay | Admitting: Family Medicine

## 2014-02-14 NOTE — Telephone Encounter (Signed)
Pt aware of lab results 

## 2014-02-14 NOTE — Telephone Encounter (Signed)
Message copied by Azalee CourseFULP, ASHLEY on Mon Feb 14, 2014 12:26 PM ------      Message from: Deatra CanterXFORD, WILLIAM J      Created: Fri Feb 11, 2014  2:05 PM       UA for microalbumin is elevated and recommend improving diabetic control and check again at next visit. ------

## 2014-02-23 ENCOUNTER — Telehealth: Payer: Self-pay | Admitting: Family Medicine

## 2014-02-23 NOTE — Telephone Encounter (Signed)
Patient aware.

## 2014-04-08 ENCOUNTER — Other Ambulatory Visit: Payer: Self-pay | Admitting: Family Medicine

## 2014-04-17 ENCOUNTER — Other Ambulatory Visit: Payer: Self-pay | Admitting: Family Medicine

## 2014-04-19 ENCOUNTER — Ambulatory Visit (INDEPENDENT_AMBULATORY_CARE_PROVIDER_SITE_OTHER): Payer: BC Managed Care – PPO | Admitting: Family Medicine

## 2014-04-19 ENCOUNTER — Encounter: Payer: Self-pay | Admitting: Family Medicine

## 2014-04-19 VITALS — BP 121/69 | HR 65 | Temp 97.7°F | Ht 74.0 in | Wt 382.0 lb

## 2014-04-19 DIAGNOSIS — J302 Other seasonal allergic rhinitis: Secondary | ICD-10-CM

## 2014-04-19 DIAGNOSIS — M129 Arthropathy, unspecified: Secondary | ICD-10-CM

## 2014-04-19 DIAGNOSIS — E119 Type 2 diabetes mellitus without complications: Secondary | ICD-10-CM

## 2014-04-19 DIAGNOSIS — J3089 Other allergic rhinitis: Secondary | ICD-10-CM

## 2014-04-19 DIAGNOSIS — E785 Hyperlipidemia, unspecified: Secondary | ICD-10-CM

## 2014-04-19 DIAGNOSIS — E118 Type 2 diabetes mellitus with unspecified complications: Principal | ICD-10-CM

## 2014-04-19 DIAGNOSIS — E1165 Type 2 diabetes mellitus with hyperglycemia: Secondary | ICD-10-CM

## 2014-04-19 DIAGNOSIS — M1990 Unspecified osteoarthritis, unspecified site: Secondary | ICD-10-CM

## 2014-04-19 DIAGNOSIS — I10 Essential (primary) hypertension: Secondary | ICD-10-CM

## 2014-04-19 DIAGNOSIS — IMO0002 Reserved for concepts with insufficient information to code with codable children: Secondary | ICD-10-CM

## 2014-04-19 DIAGNOSIS — Z23 Encounter for immunization: Secondary | ICD-10-CM

## 2014-04-19 LAB — POCT CBC
Granulocyte percent: 52.9 %G (ref 37–80)
HCT, POC: 44 % (ref 43.5–53.7)
Hemoglobin: 14.5 g/dL (ref 14.1–18.1)
Lymph, poc: 2.9 (ref 0.6–3.4)
MCH, POC: 26.7 pg — AB (ref 27–31.2)
MCHC: 33.1 g/dL (ref 31.8–35.4)
MCV: 80.6 fL (ref 80–97)
MPV: 8.7 fL (ref 0–99.8)
POC Granulocyte: 3.6 (ref 2–6.9)
POC LYMPH PERCENT: 42.9 %L (ref 10–50)
Platelet Count, POC: 229 10*3/uL (ref 142–424)
RBC: 5.5 M/uL (ref 4.69–6.13)
RDW, POC: 14.7 %
WBC: 6.8 10*3/uL (ref 4.6–10.2)

## 2014-04-19 LAB — POCT GLYCOSYLATED HEMOGLOBIN (HGB A1C): Hemoglobin A1C: 6.3

## 2014-04-19 MED ORDER — ROSUVASTATIN CALCIUM 5 MG PO TABS: 5.0000 mg | ORAL_TABLET | Freq: Every day | ORAL | Status: DC

## 2014-04-19 MED ORDER — MELOXICAM 15 MG PO TABS: 15.0000 mg | ORAL_TABLET | Freq: Every day | ORAL | Status: DC

## 2014-04-19 MED ORDER — CLONIDINE HCL 0.1 MG PO TABS: 0.1000 mg | ORAL_TABLET | Freq: Two times a day (BID) | ORAL | Status: DC

## 2014-04-19 MED ORDER — METFORMIN HCL 1000 MG PO TABS: 1000.0000 mg | ORAL_TABLET | Freq: Two times a day (BID) | ORAL | Status: DC

## 2014-04-19 MED ORDER — AMLODIPINE-OLMESARTAN 10-40 MG PO TABS: 1.0000 | ORAL_TABLET | Freq: Every day | ORAL | Status: DC

## 2014-04-19 MED ORDER — INSULIN GLARGINE 100 UNITS/ML SOLOSTAR PEN: 70.0000 [IU] | PEN_INJECTOR | Freq: Every day | SUBCUTANEOUS | Status: DC

## 2014-04-19 MED ORDER — FUROSEMIDE 80 MG PO TABS: 80.0000 mg | ORAL_TABLET | Freq: Every day | ORAL | Status: DC

## 2014-04-19 MED ORDER — CANAGLIFLOZIN 300 MG PO TABS: 300.0000 mg | ORAL_TABLET | ORAL | Status: DC

## 2014-04-19 MED ORDER — CARVEDILOL 25 MG PO TABS: 25.0000 mg | ORAL_TABLET | Freq: Two times a day (BID) | ORAL | Status: DC

## 2014-04-19 MED ORDER — MOMETASONE FUROATE 50 MCG/ACT NA SUSP: 2.0000 | Freq: Every day | NASAL | Status: DC

## 2014-04-19 NOTE — Progress Notes (Signed)
   Subjective:    Patient ID: Jon Moore, male    DOB: 02-16-1961, 53 y.o.   MRN: [id number]  HPI This 53 y.o. male presents for evaluation of routine visit.  He has hx of diabetes, hypertension, and  hyperlipidemia.   Review of Systems    No chest pain, SOB, HA, dizziness, vision change, N/V, diarrhea, constipation, dysuria, urinary urgency or frequency, myalgias, arthralgias or rash.  Objective:   Physical Exam  Vital signs noted  Well developed well nourished male.  HEENT - Head atraumatic Normocephalic                Eyes - PERRLA, Conjuctiva - clear Sclera- Clear EOMI                Ears - EAC's Wnl TM's Wnl Gross Hearing WNL                Nose - Nares patent                 Throat - oropharanx wnl Respiratory - Lungs CTA bilateral Cardiac - RRR S1 and S2 without murmur GI - Abdomen soft Nontender and bowel sounds active x 4 Extremities - No edema. Neuro - Grossly intact.      Assessment & Plan:  Type II or unspecified type diabetes mellitus with unspecified complication, uncontrolled - Plan: POCT CBC, CMP14+EGFR, POCT glycosylated hemoglobin (Hb A1C), Lipid panel  Type 2 diabetes mellitus with hyperglycemia - Plan: Canagliflozin (INVOKANA) 300 MG TABS  Essential hypertension, benign - Plan: carvedilol (COREG) 25 MG tablet, cloNIDine (CATAPRES) 0.1 MG tablet  Arthritis - Plan: meloxicam (MOBIC) 15 MG tablet  Type 2 diabetes mellitus without complication - Plan: metFORMIN (GLUCOPHAGE) 1000 MG tablet  Other seasonal allergic rhinitis - Plan: mometasone (NASONEX) 50 MCG/ACT nasal spray  Other and unspecified hyperlipidemia - Plan: rosuvastatin (CRESTOR) 5 MG tablet  Lysbeth Penner FNP

## 2014-04-20 LAB — CMP14+EGFR
ALT: 14 IU/L (ref 0–44)
AST: 19 IU/L (ref 0–40)
Albumin/Globulin Ratio: 1.2 (ref 1.1–2.5)
Albumin: 4.1 g/dL (ref 3.5–5.5)
Alkaline Phosphatase: 96 IU/L (ref 39–117)
BUN/Creatinine Ratio: 14 (ref 9–20)
BUN: 20 mg/dL (ref 6–24)
CO2: 27 mmol/L (ref 18–29)
Calcium: 9.5 mg/dL (ref 8.7–10.2)
Chloride: 100 mmol/L (ref 97–108)
Creatinine, Ser: 1.41 mg/dL — ABNORMAL HIGH (ref 0.76–1.27)
GFR calc Af Amer: 65 mL/min/{1.73_m2} (ref >59)
GFR calc non Af Amer: 56 mL/min/{1.73_m2} — ABNORMAL LOW (ref >59)
Globulin, Total: 3.4 g/dL (ref 1.5–4.5)
Glucose: 55 mg/dL — ABNORMAL LOW (ref 65–99)
Potassium: 4.1 mmol/L (ref 3.5–5.2)
Sodium: 141 mmol/L (ref 134–144)
Total Bilirubin: 0.5 mg/dL (ref 0.0–1.2)
Total Protein: 7.5 g/dL (ref 6.0–8.5)

## 2014-04-20 LAB — LIPID PANEL
Chol/HDL Ratio: 3.8 ratio units (ref 0.0–5.0)
Cholesterol, Total: 114 mg/dL (ref 100–199)
HDL: 30 mg/dL — ABNORMAL LOW (ref >39)
LDL Calculated: 61 mg/dL (ref 0–99)
Triglycerides: 115 mg/dL (ref 0–149)
VLDL Cholesterol Cal: 23 mg/dL (ref 5–40)

## 2014-04-21 ENCOUNTER — Telehealth: Payer: Self-pay | Admitting: Family Medicine

## 2014-04-22 ENCOUNTER — Telehealth: Payer: Self-pay | Admitting: Family Medicine

## 2014-04-22 NOTE — Telephone Encounter (Signed)
Results have not been reviewed yet. Will call once they are.

## 2014-04-24 NOTE — Telephone Encounter (Signed)
Duplicate message. 

## 2014-04-24 NOTE — Telephone Encounter (Signed)
Left detailed message with lab results

## 2014-05-16 ENCOUNTER — Telehealth: Payer: Self-pay | Admitting: Family Medicine

## 2014-05-18 ENCOUNTER — Other Ambulatory Visit: Payer: Self-pay | Admitting: Family Medicine

## 2014-06-21 ENCOUNTER — Ambulatory Visit: Payer: BC Managed Care – PPO | Admitting: Family Medicine

## 2014-07-19 ENCOUNTER — Ambulatory Visit (INDEPENDENT_AMBULATORY_CARE_PROVIDER_SITE_OTHER): Payer: BC Managed Care – PPO | Admitting: Family Medicine

## 2014-07-19 VITALS — BP 139/70 | HR 64 | Temp 97.9°F | Ht 74.0 in | Wt 364.0 lb

## 2014-07-19 DIAGNOSIS — I10 Essential (primary) hypertension: Secondary | ICD-10-CM

## 2014-07-19 DIAGNOSIS — E785 Hyperlipidemia, unspecified: Secondary | ICD-10-CM

## 2014-07-19 DIAGNOSIS — Z23 Encounter for immunization: Secondary | ICD-10-CM

## 2014-07-19 DIAGNOSIS — E119 Type 2 diabetes mellitus without complications: Secondary | ICD-10-CM

## 2014-07-19 LAB — POCT CBC
Granulocyte percent: 60.8 %G (ref 37–80)
HCT, POC: 46.2 % (ref 43.5–53.7)
Hemoglobin: 14.5 g/dL (ref 14.1–18.1)
Lymph, poc: 3 (ref 0.6–3.4)
MCH, POC: 25.5 pg — AB (ref 27–31.2)
MCHC: 31.5 g/dL — AB (ref 31.8–35.4)
MCV: 81.2 fL (ref 80–97)
MPV: 8.3 fL (ref 0–99.8)
POC Granulocyte: 5 (ref 2–6.9)
POC LYMPH PERCENT: 36.3 %L (ref 10–50)
Platelet Count, POC: 297 10*3/uL (ref 142–424)
RBC: 5.7 M/uL (ref 4.69–6.13)
RDW, POC: 14.7 %
WBC: 8.3 10*3/uL (ref 4.6–10.2)

## 2014-07-19 LAB — POCT GLYCOSYLATED HEMOGLOBIN (HGB A1C): Hemoglobin A1C: 6

## 2014-07-19 NOTE — Progress Notes (Signed)
   Subjective:    Patient ID: Jon Moore, male    DOB: [birth year], 53 y.o.   MRN: [id number]  HPI Patient is here for routine diabetes appointment. He has no acute complaints.    Review of Systems  Constitutional: Negative for fever.  HENT: Negative for ear pain.   Eyes: Negative for discharge.  Respiratory: Negative for cough.   Cardiovascular: Negative for chest pain.  Gastrointestinal: Negative for abdominal distention.  Endocrine: Negative for polyuria.  Genitourinary: Negative for difficulty urinating.  Musculoskeletal: Negative for gait problem and neck pain.  Skin: Negative for color change and rash.  Neurological: Negative for speech difficulty and headaches.  Psychiatric/Behavioral: Negative for agitation.       Objective:    BP 139/70 mmHg  Pulse 64  Temp(Src) 97.9 F (36.6 C) (Oral)  Ht $R'6\' 2"'EY$  (1.88 m)  Wt 364 lb (165.109 kg)  BMI 46.71 kg/m2 Physical Exam  Constitutional: He is oriented to person, place, and time. He appears well-developed and well-nourished.  HENT:  Head: Normocephalic and atraumatic.  Mouth/Throat: Oropharynx is clear and moist.  Eyes: Pupils are equal, round, and reactive to light.  Neck: Normal range of motion. Neck supple.  Cardiovascular: Normal rate and regular rhythm.   No murmur heard. Pulmonary/Chest: Effort normal and breath sounds normal.  Abdominal: Soft. Bowel sounds are normal. There is no tenderness.  Neurological: He is alert and oriented to person, place, and time.  Skin: Skin is warm and dry.  Psychiatric: He has a normal mood and affect.          Assessment & Plan:     ICD-9-CM ICD-10-CM   1. Diabetes mellitus type 2, controlled, without complications 624.46 X50.7 POCT glycosylated hemoglobin (Hb A1C)     CMP14+EGFR  2. Essential hypertension, benign 401.1 I10 POCT CBC     CMP14+EGFR  3. Hyperlipemia 272.4 E78.5 Lipid panel     Return in about 3 months (around 10/18/2014).  Lysbeth Penner FNP

## 2014-07-20 LAB — CMP14+EGFR
ALT: 19 IU/L (ref 0–44)
AST: 20 IU/L (ref 0–40)
Albumin/Globulin Ratio: 1.2 (ref 1.1–2.5)
Albumin: 4.2 g/dL (ref 3.5–5.5)
Alkaline Phosphatase: 105 IU/L (ref 39–117)
BUN/Creatinine Ratio: 14 (ref 9–20)
BUN: 24 mg/dL (ref 6–24)
CO2: 27 mmol/L (ref 18–29)
Calcium: 9.6 mg/dL (ref 8.7–10.2)
Chloride: 98 mmol/L (ref 97–108)
Creatinine, Ser: 1.74 mg/dL — ABNORMAL HIGH (ref 0.76–1.27)
GFR calc Af Amer: 50 mL/min/{1.73_m2} — ABNORMAL LOW (ref >59)
GFR calc non Af Amer: 43 mL/min/{1.73_m2} — ABNORMAL LOW (ref >59)
Globulin, Total: 3.4 g/dL (ref 1.5–4.5)
Glucose: 65 mg/dL (ref 65–99)
Potassium: 4.3 mmol/L (ref 3.5–5.2)
Sodium: 140 mmol/L (ref 134–144)
Total Bilirubin: 0.5 mg/dL (ref 0.0–1.2)
Total Protein: 7.6 g/dL (ref 6.0–8.5)

## 2014-07-20 LAB — LIPID PANEL
Chol/HDL Ratio: 4.3 ratio units (ref 0.0–5.0)
Cholesterol, Total: 125 mg/dL (ref 100–199)
HDL: 29 mg/dL — ABNORMAL LOW (ref >39)
LDL Calculated: 65 mg/dL (ref 0–99)
Triglycerides: 153 mg/dL — ABNORMAL HIGH (ref 0–149)
VLDL Cholesterol Cal: 31 mg/dL (ref 5–40)

## 2014-07-21 ENCOUNTER — Ambulatory Visit: Payer: BC Managed Care – PPO | Admitting: Family Medicine

## 2014-07-25 ENCOUNTER — Encounter: Payer: Self-pay | Admitting: *Deleted

## 2014-07-25 NOTE — Progress Notes (Signed)
Telephone call to patient to discuss his lab results from 07/19/2014. Patient was concerned because his Creatinine was elevated. I discussed in great detail the reasons his Creatinine could be elevated and informed patient that we would want to continue to monitor his creatinine because of his diagnosis of diabetes. Patient verbalizes understanding and had no further questions concerning his lab results.

## 2014-07-26 ENCOUNTER — Ambulatory Visit (INDEPENDENT_AMBULATORY_CARE_PROVIDER_SITE_OTHER): Payer: BC Managed Care – PPO | Admitting: *Deleted

## 2014-07-26 DIAGNOSIS — Z23 Encounter for immunization: Secondary | ICD-10-CM

## 2014-08-05 ENCOUNTER — Encounter: Payer: Self-pay | Admitting: Family Medicine

## 2014-08-16 ENCOUNTER — Other Ambulatory Visit: Payer: Self-pay | Admitting: Family Medicine

## 2014-08-16 NOTE — Telephone Encounter (Signed)
Aware, script was corrected on 07-20-14.

## 2014-09-26 ENCOUNTER — Telehealth: Payer: Self-pay | Admitting: Family Medicine

## 2014-09-27 MED ORDER — INSULIN PEN NEEDLE 29G X 12.7MM MISC: Status: DC

## 2014-09-27 NOTE — Telephone Encounter (Signed)
done

## 2014-10-25 NOTE — Telephone Encounter (Signed)
Issue was addressed with patient.

## 2014-11-10 ENCOUNTER — Encounter: Payer: Self-pay | Admitting: Family Medicine

## 2014-11-10 ENCOUNTER — Ambulatory Visit (INDEPENDENT_AMBULATORY_CARE_PROVIDER_SITE_OTHER): Payer: BLUE CROSS/BLUE SHIELD | Admitting: Family Medicine

## 2014-11-10 VITALS — BP 136/81 | HR 66 | Temp 97.2°F | Ht 74.0 in | Wt 383.0 lb

## 2014-11-10 DIAGNOSIS — I10 Essential (primary) hypertension: Secondary | ICD-10-CM | POA: Diagnosis not present

## 2014-11-10 DIAGNOSIS — E119 Type 2 diabetes mellitus without complications: Secondary | ICD-10-CM

## 2014-11-10 LAB — POCT GLYCOSYLATED HEMOGLOBIN (HGB A1C): Hemoglobin A1C: 6.5

## 2014-11-10 NOTE — Progress Notes (Signed)
   Subjective:    Patient ID: Jon Moore, male    DOB: [birth year], 54 y.o.   MRN: [id number]  HPI 54 year old gentleman here to follow-up diabetes and hypertension. His last A1c about 4 months ago was 6.0 but he has since stopped his Invokana because he read about some unfavorable side effects. Affordability may also be an issue. He has also gained about 20 pounds since his last visit over the past 4 months. We talked about importance of maintenance of weight and avoidance of carbohydrates.  Patient Active Problem List   Diagnosis Date Noted  . HTN (hypertension) 01/13/2013  . Diabetes 01/13/2013  . Screening for colon cancer 05/30/2011   Outpatient Encounter Prescriptions as of 11/10/2014  Medication Sig  . amLODipine-olmesartan (AZOR) 10-40 MG per tablet Take 1 tablet by mouth daily.  . Canagliflozin (INVOKANA) 300 MG TABS Take 1 tablet (300 mg total) by mouth 1 day or 1 dose.  . carvedilol (COREG) 25 MG tablet Take 1 tablet (25 mg total) by mouth 2 (two) times daily with a meal.  . cloNIDine (CATAPRES) 0.1 MG tablet Take 1 tablet (0.1 mg total) by mouth 2 (two) times daily.  . furosemide (LASIX) 80 MG tablet Take 1 tablet (80 mg total) by mouth daily.  . insulin glargine (LANTUS) 100 unit/mL SOPN Inject 0.7 mLs (70 Units total) into the skin daily at 10 pm.  . Insulin Pen Needle 29G X 12.7MM MISC Use to inject Lantus once qd  . meloxicam (MOBIC) 15 MG tablet Take 1 tablet (15 mg total) by mouth daily.  . metFORMIN (GLUCOPHAGE) 1000 MG tablet Take 1 tablet (1,000 mg total) by mouth 2 (two) times daily with a meal.  . mometasone (NASONEX) 50 MCG/ACT nasal spray Place 2 sprays into the nose daily.  . rosuvastatin (CRESTOR) 5 MG tablet Take 1 tablet (5 mg total) by mouth daily.      Review of Systems  Constitutional: Positive for unexpected weight change.  HENT: Negative.   Respiratory: Negative.   Cardiovascular: Negative.   Genitourinary: Negative.   Neurological: Negative.     Psychiatric/Behavioral: Negative.        Objective:   Physical Exam  Constitutional: He is oriented to person, place, and time.  Patient is obese  Cardiovascular: Normal rate and regular rhythm.   Pulmonary/Chest: Effort normal and breath sounds normal.  Neurological: He is alert and oriented to person, place, and time. He has normal reflexes.    BP 136/81 mmHg  Pulse 66  Temp(Src) 97.2 F (36.2 C) (Oral)  Ht $R'6\' 2"'si$  (1.88 m)  Wt 383 lb (173.728 kg)  BMI 49.15 kg/m2        Assessment & Plan:  1. Type 2 diabetes mellitus without complication Since he stopped Invokana will monitor to see if diabetes can be controlled. I note that he is on 80 mg of Lasix monitor renal function and especially potassium closely - POCT glycosylated hemoglobin (Hb A1C) - BMP8+EGFR  2. Essential hypertension   Blood pressure is good today continue with amlodipine and olmesartan as well as clonidine and carvedilol  Wardell Honour MD

## 2014-11-11 LAB — BMP8+EGFR
BUN/Creatinine Ratio: 18 (ref 9–20)
BUN: 26 mg/dL — ABNORMAL HIGH (ref 6–24)
CO2: 29 mmol/L (ref 18–29)
Calcium: 9.3 mg/dL (ref 8.7–10.2)
Chloride: 100 mmol/L (ref 97–108)
Creatinine, Ser: 1.42 mg/dL — ABNORMAL HIGH (ref 0.76–1.27)
GFR calc non Af Amer: 56 mL/min/{1.73_m2} — ABNORMAL LOW (ref >59)
GFR, EST AFRICAN AMERICAN: 64 mL/min/{1.73_m2} (ref >59)
Glucose: 73 mg/dL (ref 65–99)
Potassium: 4.4 mmol/L (ref 3.5–5.2)
SODIUM: 141 mmol/L (ref 134–144)

## 2015-02-22 ENCOUNTER — Encounter: Payer: Self-pay | Admitting: *Deleted

## 2015-03-14 ENCOUNTER — Other Ambulatory Visit: Payer: Self-pay | Admitting: *Deleted

## 2015-03-14 MED ORDER — FUROSEMIDE 80 MG PO TABS: 80.0000 mg | ORAL_TABLET | Freq: Every day | ORAL | Status: DC

## 2015-03-21 ENCOUNTER — Telehealth: Payer: Self-pay | Admitting: Family Medicine

## 2015-03-21 ENCOUNTER — Telehealth: Payer: Self-pay

## 2015-03-21 NOTE — Telephone Encounter (Signed)
Insurance prior authorized Azor [id number]

## 2015-03-21 NOTE — Telephone Encounter (Signed)
Pt notified of insurance authorization

## 2015-03-22 ENCOUNTER — Ambulatory Visit: Payer: BLUE CROSS/BLUE SHIELD | Admitting: Family Medicine

## 2015-03-29 ENCOUNTER — Ambulatory Visit: Payer: BLUE CROSS/BLUE SHIELD | Admitting: Family Medicine

## 2015-04-12 ENCOUNTER — Encounter: Payer: Self-pay | Admitting: Family Medicine

## 2015-04-12 ENCOUNTER — Ambulatory Visit (INDEPENDENT_AMBULATORY_CARE_PROVIDER_SITE_OTHER): Payer: BLUE CROSS/BLUE SHIELD | Admitting: Family Medicine

## 2015-04-12 ENCOUNTER — Telehealth: Payer: Self-pay | Admitting: Family Medicine

## 2015-04-12 VITALS — BP 133/78 | HR 71 | Temp 97.7°F | Ht 74.0 in | Wt 388.0 lb

## 2015-04-12 DIAGNOSIS — I10 Essential (primary) hypertension: Secondary | ICD-10-CM

## 2015-04-12 DIAGNOSIS — E119 Type 2 diabetes mellitus without complications: Secondary | ICD-10-CM

## 2015-04-12 LAB — POCT GLYCOSYLATED HEMOGLOBIN (HGB A1C): Hemoglobin A1C: 7.1

## 2015-04-12 MED ORDER — FUROSEMIDE 80 MG PO TABS: 80.0000 mg | ORAL_TABLET | Freq: Two times a day (BID) | ORAL | Status: DC

## 2015-04-12 NOTE — Progress Notes (Signed)
   Subjective:    Patient ID: Jon Moore, male    DOB: 1961/02/16, 54 y.o.   MRN: [id number]  HPI 54 year old gentleman here to follow-up diabetes and hypertension. We had stopped Invokana at his last visit due to money but now he feels like he is able to get it if A1c has a increased from last check. He knows he needs to watch his diet and lose weight but it's hard for him to do. He is no longer exercising at the gym either. He denies chest pain. He denies any foot or leg symptoms. He does have some dependent edema may also be related to amlodipine use.  Patient Active Problem List   Diagnosis Date Noted  . HTN (hypertension) 01/13/2013  . Diabetes 01/13/2013  . Screening for colon cancer 05/30/2011   Outpatient Encounter Prescriptions as of 04/12/2015  Medication Sig  . amLODipine-olmesartan (AZOR) 10-40 MG per tablet Take 1 tablet by mouth daily.  . Canagliflozin (INVOKANA) 300 MG TABS Take 1 tablet (300 mg total) by mouth 1 day or 1 dose.  . carvedilol (COREG) 25 MG tablet Take 1 tablet (25 mg total) by mouth 2 (two) times daily with a meal.  . cloNIDine (CATAPRES) 0.1 MG tablet Take 1 tablet (0.1 mg total) by mouth 2 (two) times daily.  . furosemide (LASIX) 80 MG tablet Take 1 tablet (80 mg total) by mouth daily.  . insulin glargine (LANTUS) 100 unit/mL SOPN Inject 0.7 mLs (70 Units total) into the skin daily at 10 pm.  . Insulin Pen Needle 29G X 12.7MM MISC Use to inject Lantus once qd  . meloxicam (MOBIC) 15 MG tablet Take 1 tablet (15 mg total) by mouth daily.  . metFORMIN (GLUCOPHAGE) 1000 MG tablet Take 1 tablet (1,000 mg total) by mouth 2 (two) times daily with a meal.  . rosuvastatin (CRESTOR) 5 MG tablet Take 1 tablet (5 mg total) by mouth daily.  . [DISCONTINUED] mometasone (NASONEX) 50 MCG/ACT nasal spray Place 2 sprays into the nose daily.   No facility-administered encounter medications on file as of 04/12/2015.      Review of Systems  Constitutional: Negative.     Respiratory: Positive for wheezing.   Endocrine: Negative.   Genitourinary: Negative.   Neurological: Negative.   Psychiatric/Behavioral: Negative.        Objective:   Physical Exam  Constitutional: He is oriented to person, place, and time.  Obese  Cardiovascular: Normal rate and regular rhythm.   Pulmonary/Chest: Effort normal and breath sounds normal.  Neurological: He is alert and oriented to person, place, and time.          Assessment & Plan:  1. Diabetes mellitus type 2, controlled, without complications Continues with Lantus and metformin. He reduce his Lantus recently is fasting sugars were a little low. If A1c is still below 7 we'll continue with Lantus and metformin area and if it is up above 7 or will add Invega, back  2. Essential hypertension, benign Blood pressure well controlled. There is been some pushback from insurance on the combination drug Azor but he has now acquired  Frederica Kuster MD

## 2015-04-12 NOTE — Telephone Encounter (Signed)
done

## 2015-04-25 ENCOUNTER — Other Ambulatory Visit: Payer: Self-pay | Admitting: Family Medicine

## 2015-04-25 NOTE — Telephone Encounter (Signed)
Last seen 04/12/15 Dr Hyacinth Meeker  Last lipid 07/19/14

## 2015-04-27 ENCOUNTER — Telehealth: Payer: Self-pay | Admitting: Family Medicine

## 2015-04-28 MED ORDER — ROSUVASTATIN CALCIUM 5 MG PO TABS: 5.0000 mg | ORAL_TABLET | Freq: Every day | ORAL | Status: DC

## 2015-04-28 NOTE — Telephone Encounter (Signed)
Please okay generic Crestor or give him a card so he can get 3 months for $3

## 2015-04-28 NOTE — Telephone Encounter (Signed)
Please address

## 2015-05-06 ENCOUNTER — Other Ambulatory Visit: Payer: Self-pay | Admitting: Family Medicine

## 2015-05-14 ENCOUNTER — Other Ambulatory Visit: Payer: Self-pay | Admitting: Family Medicine

## 2015-05-22 ENCOUNTER — Other Ambulatory Visit: Payer: Self-pay | Admitting: Family Medicine

## 2015-06-29 ENCOUNTER — Other Ambulatory Visit: Payer: Self-pay | Admitting: Family Medicine

## 2015-07-08 ENCOUNTER — Ambulatory Visit (INDEPENDENT_AMBULATORY_CARE_PROVIDER_SITE_OTHER): Payer: BLUE CROSS/BLUE SHIELD | Admitting: Family Medicine

## 2015-07-08 VITALS — BP 154/87 | HR 97 | Temp 98.4°F | Ht 74.0 in | Wt 398.4 lb

## 2015-07-08 DIAGNOSIS — M10022 Idiopathic gout, left elbow: Secondary | ICD-10-CM | POA: Diagnosis not present

## 2015-07-08 DIAGNOSIS — M25522 Pain in left elbow: Secondary | ICD-10-CM

## 2015-07-08 DIAGNOSIS — M109 Gout, unspecified: Secondary | ICD-10-CM

## 2015-07-08 DIAGNOSIS — M25529 Pain in unspecified elbow: Secondary | ICD-10-CM | POA: Insufficient documentation

## 2015-07-08 MED ORDER — INDOMETHACIN 50 MG PO CAPS: 50.0000 mg | ORAL_CAPSULE | Freq: Three times a day (TID) | ORAL | Status: DC | PRN

## 2015-07-08 NOTE — Progress Notes (Signed)
   HPI  Patient presents today here for left elbow pain evaluation.  She explains that for the last 2-3 days she's had slowly developing and worsening left elbow stiffness and pain. Difficulty sleeping last night. Patient states he's had this pain once before in the same elbow, he also has a history of gout which manifested in his toe. He denies any injury to elbow. He has been working out lately but has not been able to work This week due to dizziness at work. He denies fever, chills, sweats, nausea, vomiting, or feeling ill.  PMH: Smoking status noted ROS: Per HPI  Objective: BP 154/87 mmHg  Pulse 97  Temp(Src) 98.4 F (36.9 C) (Oral)  Ht 6\' 2"  (1.88 m)  Wt 398 lb 6.4 oz (180.713 kg)  BMI 51.13 kg/m2 Gen: NAD, alert, cooperative with exam HEENT: NCAT CV: RRR, good S1/S2, no murmur - distant heart sounds Resp: CTABL, no wheezes, non-labored Abd: SNTND, BS present, no guarding or organomegaly Ext:  Left elbow warm and swollen, no erythema, limited range of motion with only 20-30 of flexion and extension Neuro: Alert and oriented, No gross deficits  Assessment and plan:  # Left elbow pain, gout Recurrent left elbow pain with a history of gout, previously treated with NSAIDs effectively. Will prescribe indomethacin scheduled 4-5 days then when necessary. Recommended very low threshold for emergency medical care due to the seriousness of the alternative which is septic arthritis, the patient denies any systemic symptoms of illness and agrees to go to the emergency room should he develop fever, nausea, or malaise. This would be for evaluation of septic arthritis. Return for failure to improve or worsening symptoms.    Meds ordered this encounter  Medications  . indomethacin (INDOCIN) 50 MG capsule    Sig: Take 1 capsule (50 mg total) by mouth 3 (three) times daily with meals as needed for moderate pain.    Dispense:  30 capsule    Refill:  2    Murtis SinkSam Shalie Schremp, MD [facility] 07/08/2015, 9:12 AM

## 2015-07-08 NOTE — Patient Instructions (Signed)
Great to meet you!  Gout Gout is when your joints become red, sore, and swell (inflamed). This is caused by the buildup of uric acid crystals in the joints. Uric acid is a chemical that is normally in the blood. If the level of uric acid gets too high in the blood, these crystals form in your joints and tissues. Over time, these crystals can form into masses near the joints and tissues. These masses can destroy bone and cause the bone to look misshapen (deformed). HOME CARE   Do not take aspirin for pain.  Only take medicine as told by your doctor.  Rest the joint as much as you can. When in bed, keep sheets and blankets off painful areas.  Keep the sore joints raised (elevated).  Put warm or cold packs on painful joints. Use of warm or cold packs depends on which works best for you.  Use crutches if the painful joint is in your leg.  Drink enough fluids to keep your pee (urine) clear or pale yellow. Limit alcohol, sugary drinks, and drinks with fructose in them.  Follow your diet instructions. Pay careful attention to how much protein you eat. Include fruits, vegetables, whole grains, and fat-free or low-fat milk products in your daily diet. Talk to your doctor or dietitian about the use of coffee, vitamin C, and cherries. These may help lower uric acid levels.  Keep a healthy body weight. GET HELP RIGHT AWAY IF:   You have watery poop (diarrhea), throw up (vomit), or have any side effects from medicines.  You do not feel better in 24 hours, or you are getting worse.  Your joint becomes suddenly more tender, and you have chills or a fever. MAKE SURE YOU:   Understand these instructions.  Will watch your condition.  Will get help right away if you are not doing well or get worse.   This information is not intended to replace advice given to you by your health care provider. Make sure you discuss any questions you have with your health care provider.   Document Released:  05/14/2008 Document Revised: 08/26/2014 Document Reviewed: 03/18/2012 Elsevier Interactive Patient Education [employer]2016 Elsevier Inc.

## 2015-07-10 ENCOUNTER — Other Ambulatory Visit: Payer: Self-pay | Admitting: Family Medicine

## 2015-07-18 ENCOUNTER — Telehealth: Payer: Self-pay | Admitting: Family Medicine

## 2015-07-19 MED ORDER — FUROSEMIDE 80 MG PO TABS: 80.0000 mg | ORAL_TABLET | Freq: Two times a day (BID) | ORAL | Status: DC

## 2015-07-19 NOTE — Telephone Encounter (Signed)
done

## 2015-08-14 LAB — HM DIABETES EYE EXAM

## 2015-08-17 ENCOUNTER — Other Ambulatory Visit: Payer: Self-pay | Admitting: Family Medicine

## 2015-08-18 ENCOUNTER — Encounter: Payer: Self-pay | Admitting: *Deleted

## 2015-09-11 ENCOUNTER — Ambulatory Visit (INDEPENDENT_AMBULATORY_CARE_PROVIDER_SITE_OTHER): Payer: BLUE CROSS/BLUE SHIELD | Admitting: Family Medicine

## 2015-09-11 ENCOUNTER — Encounter: Payer: Self-pay | Admitting: Family Medicine

## 2015-09-11 VITALS — BP 155/74 | HR 77 | Temp 98.0°F | Ht 74.0 in | Wt 399.8 lb

## 2015-09-11 DIAGNOSIS — I10 Essential (primary) hypertension: Secondary | ICD-10-CM | POA: Diagnosis not present

## 2015-09-11 DIAGNOSIS — E119 Type 2 diabetes mellitus without complications: Secondary | ICD-10-CM | POA: Diagnosis not present

## 2015-09-11 DIAGNOSIS — M25522 Pain in left elbow: Secondary | ICD-10-CM

## 2015-09-11 DIAGNOSIS — Z0000 Encounter for general adult medical examination without abnormal findings: Secondary | ICD-10-CM | POA: Insufficient documentation

## 2015-09-11 LAB — POCT GLYCOSYLATED HEMOGLOBIN (HGB A1C): HEMOGLOBIN A1C: 8.3

## 2015-09-11 NOTE — Patient Instructions (Signed)
Great to met you!  You're control has slipped slightly, but you are doing all of th eright things!   Keep the log like we talked about   Try to add another mile to your biking, or another day to your routine  Plan to see Tammy in 1 month, and me in 3 months     Diet Recommendations for Diabetes   Starchy (carb) foods include: Bread, rice, pasta, potatoes, corn, crackers, bagels, muffins, all baked goods.   Protein foods include: Meat, fish, poultry, eggs, dairy foods, and beans such as pinto and kidney beans (beans also provide carbohydrate).   1. Eat at least 3 meals and 1-2 snacks per day. Never go more than 4-5 hours while awake without eating.  2. Limit starchy foods to TWO per meal and ONE per snack. ONE portion of a starchy  food is equal to the following:   - ONE slice of bread (or its equivalent, such as half of a hamburger bun).   - 1/2 cup of a "scoopable" starchy food such as potatoes or rice.   - 1 OUNCE (28 grams) of starchy snack foods such as crackers or pretzels (look on label).   - 15 grams of carbohydrate as shown on food label.  3. Both lunch and dinner should include a protein food, a carb food, and vegetables.   - Obtain twice as many veg's as protein or carbohydrate foods for both lunch and dinner.   - Try to keep frozen veg's on hand for a quick vegetable serving.     - Fresh or frozen veg's are best.  4. Breakfast should always include protein.

## 2015-09-11 NOTE — Progress Notes (Signed)
   HPI  Patient presents today here today for follow-up of diabetes, hypertension, renal vein 3.  Diabetes Good medication compliance, 62 units of Lantus daily Also on Invokana, and metformin. No hypoglycemia Average fasting blood sugar 100-120. Average postprandial at night is 150-180.  Exercising 3 days weekly, about 30 minutes of cardio and weightlifting. Watching diet as well.  Hypertension No chest pain, dyspnea, palpitations, leg edema that is new. Has had right greater than left lower extremity leg edema since he had severe cellulitis in 2013. Wears compression stockings Good medication compliance  Elbow pain Resolved, he took meloxicam for a short time but has quit.   PMH: Smoking status noted ROS: Per HPI  Objective: BP 155/74 mmHg  Pulse 77  Temp(Src) 98 F (36.7 C) (Oral)  Ht '6\' 2"'$  (1.88 m)  Wt 399 lb 12.8 oz (181.348 kg)  BMI 51.31 kg/m2 Gen: NAD, alert, cooperative with exam HEENT: NCAT, EOMI, PERRL CV: RRR, good S1/S2, no murmur Resp: CTABL, no wheezes, non-labored Abd: SNTND, BS present, no guarding or organomegaly Ext: Right greater than left pitting edema, 2+ on the right, 1+ in the left, compression stockings in place Neuro: Alert and oriented, No gross deficits  Assessment and plan:  # IVs A1c with loss of control with A1c changing from 7.1-8.3 Continue current medications as he has started aggressive lifestyle changes. Recheck in 3 months. One month check with clinical pharmacist. Consider weekly G LP  # Hypertension Slightly elevated today Good medical compliance No red flags With lifestyle changes will hold off on more aggressive treatment for 3 more months. Consider clonidine patch rather than clonidine by mouth  # Elbow pain Result Discussed limiting NSAIDs  HCM HCV added Optho up todate Foot exam up to date   Orders Placed This Encounter  Procedures  . Microalbumin / creatinine urine ratio  . CMP14+EGFR  . Lipid Panel   . CBC  . Hepatitis C Antibody  . POCT glycosylated hemoglobin (Hb A1C)     Laroy Apple, MD [facility] 09/11/2015, 8:33 AM

## 2015-09-12 LAB — CBC
Hematocrit: 41.8 % (ref 37.5–51.0)
Hemoglobin: 14.4 g/dL (ref 12.6–17.7)
MCH: 27.1 pg (ref 26.6–33.0)
MCHC: 34.4 g/dL (ref 31.5–35.7)
MCV: 79 fL (ref 79–97)
PLATELETS: 238 10*3/uL (ref 150–379)
RBC: 5.32 x10E6/uL (ref 4.14–5.80)
RDW: 14.8 % (ref 12.3–15.4)
WBC: 7.2 10*3/uL (ref 3.4–10.8)

## 2015-09-12 LAB — LIPID PANEL
CHOL/HDL RATIO: 4 ratio (ref 0.0–5.0)
Cholesterol, Total: 108 mg/dL (ref 100–199)
HDL: 27 mg/dL — ABNORMAL LOW (ref >39)
LDL CALC: 55 mg/dL (ref 0–99)
Triglycerides: 130 mg/dL (ref 0–149)
VLDL CHOLESTEROL CAL: 26 mg/dL (ref 5–40)

## 2015-09-12 LAB — CMP14+EGFR
ALBUMIN: 3.9 g/dL (ref 3.5–5.5)
ALT: 19 IU/L (ref 0–44)
AST: 19 IU/L (ref 0–40)
Albumin/Globulin Ratio: 1 — ABNORMAL LOW (ref 1.1–2.5)
Alkaline Phosphatase: 129 IU/L — ABNORMAL HIGH (ref 39–117)
BILIRUBIN TOTAL: 0.4 mg/dL (ref 0.0–1.2)
BUN / CREAT RATIO: 14 (ref 9–20)
BUN: 24 mg/dL (ref 6–24)
CO2: 28 mmol/L (ref 18–29)
CREATININE: 1.76 mg/dL — AB (ref 0.76–1.27)
Calcium: 9.4 mg/dL (ref 8.7–10.2)
Chloride: 101 mmol/L (ref 96–106)
GFR calc non Af Amer: 43 mL/min/{1.73_m2} — ABNORMAL LOW (ref >59)
GFR, EST AFRICAN AMERICAN: 49 mL/min/{1.73_m2} — AB (ref >59)
GLOBULIN, TOTAL: 4.1 g/dL (ref 1.5–4.5)
GLUCOSE: 129 mg/dL — AB (ref 65–99)
Potassium: 4.4 mmol/L (ref 3.5–5.2)
Sodium: 143 mmol/L (ref 134–144)
TOTAL PROTEIN: 8 g/dL (ref 6.0–8.5)

## 2015-09-12 LAB — MICROALBUMIN / CREATININE URINE RATIO
Creatinine, Urine: 140.8 mg/dL
MICROALB/CREAT RATIO: 18.8 mg/g{creat} (ref 0.0–30.0)
Microalbumin, Urine: 26.4 ug/mL

## 2015-09-12 LAB — HEPATITIS C ANTIBODY: HEP C VIRUS AB: 0.1 {s_co_ratio} (ref 0.0–0.9)

## 2015-10-13 ENCOUNTER — Ambulatory Visit (INDEPENDENT_AMBULATORY_CARE_PROVIDER_SITE_OTHER): Payer: BLUE CROSS/BLUE SHIELD | Admitting: Pharmacist

## 2015-10-13 ENCOUNTER — Encounter: Payer: Self-pay | Admitting: Pharmacist

## 2015-10-13 VITALS — BP 140/86 | HR 80 | Ht 74.0 in | Wt >= 6400 oz

## 2015-10-13 DIAGNOSIS — E669 Obesity, unspecified: Secondary | ICD-10-CM | POA: Diagnosis not present

## 2015-10-13 DIAGNOSIS — I10 Essential (primary) hypertension: Secondary | ICD-10-CM

## 2015-10-13 DIAGNOSIS — E119 Type 2 diabetes mellitus without complications: Secondary | ICD-10-CM

## 2015-10-13 NOTE — Patient Instructions (Signed)
Diabetes and Standards of Medical Care   Diabetes is complicated. You may find that your diabetes team includes a dietitian, nurse, diabetes educator, eye doctor, and more. To help everyone know what is going on and to help you get the care you deserve, the following schedule of care was developed to help keep you on track. Below are the tests, exams, vaccines, medicines, education, and plans you will need.  Blood Glucose Goals Prior to meals = 80 - 130 Within 2 hours of the start of a meal = less than 180  HbA1c test (goal is less than 7.0% - your last value was 8.3%) This test shows how well you have controlled your glucose over the past 2 to 3 months. It is used to see if your diabetes management plan needs to be adjusted.   It is performed at least 2 times a year if you are meeting treatment goals.  It is performed 4 times a year if therapy has changed or if you are not meeting treatment goals.  Blood pressure test  This test is performed at every routine medical visit. The goal is less than 140/90 mmHg for most people, but 130/80 mmHg in some cases. Ask your health care provider about your goal.  Dental exam  Follow up with the dentist regularly.  Eye exam  If you are diagnosed with type 1 diabetes as a child, get an exam upon reaching the age of 10 years or older and have had diabetes for 3 to 5 years. Yearly eye exams are recommended after that initial eye exam.  If you are diagnosed with type 1 diabetes as an adult, get an exam within 5 years of diagnosis and then yearly.  If you are diagnosed with type 2 diabetes, get an exam as soon as possible after the diagnosis and then yearly.  Foot care exam  Visual foot exams are performed at every routine medical visit. The exams check for cuts, injuries, or other problems with the feet.  A comprehensive foot exam should be done yearly. This includes visual inspection as well as assessing foot pulses and testing for loss of  sensation.  Check your feet nightly for cuts, injuries, or other problems with your feet. Tell your health care provider if anything is not healing.  Kidney function test (urine microalbumin)  This test is performed once a year.  Type 1 diabetes: The first test is performed 5 years after diagnosis.  Type 2 diabetes: The first test is performed at the time of diagnosis.  A serum creatinine and estimated glomerular filtration rate (eGFR) test is done once a year to assess the level of chronic kidney disease (CKD), if present.  Lipid profile (cholesterol, HDL, LDL, triglycerides)  Performed every 5 years for most people.  The goal for LDL is less than 100 mg/dL. If you are at high risk, the goal is less than 70 mg/dL.  The goal for HDL is 40 mg/dL to 50 mg/dL for men and 50 mg/dL to 60 mg/dL for women. An HDL cholesterol of 60 mg/dL or higher gives some protection against heart disease.  The goal for triglycerides is less than 150 mg/dL.  Influenza vaccine, pneumococcal vaccine, and hepatitis B vaccine  The influenza vaccine is recommended yearly.  The pneumococcal vaccine is generally given once in a lifetime. However, there are some instances when another vaccination is recommended. Check with your health care provider.  The hepatitis B vaccine is also recommended for adults with diabetes.    Diabetes self-management education  Education is recommended at diagnosis and ongoing as needed.  Treatment plan  Your treatment plan is reviewed at every medical visit.  Document Released: 06/02/2009 Document Revised: 04/07/2013 Document Reviewed: 01/05/2013 [employer] Patient Information 2014 [employer].   

## 2015-10-13 NOTE — Progress Notes (Signed)
Subjective:    Jon Moore is a 55 y.o. male who presents for an initial evaluation of Type 2 diabetes mellitus.  Current symptoms/problems include hyperglycemia and have been improving since his visit with Dr Ermalinda Memos 1 month ago when insulin dose was increased. Symptoms have been present for 4 months.   Known diabetic complications: none Cardiovascular risk factors: advanced age (older than 48 for men, 56 for women), diabetes mellitus, dyslipidemia, hypertension, male gender and obesity (BMI >= 30 kg/m2) Current diabetic medications include Invokana  1 tablet daily; metformin  bid and Lantus 70 units once daily.   Eye exam current (within one year): yes Weight trend: increasing steadily Prior visit with CDE: yes - I have seen patient in past but has been over 2 years ago Current diet: in general, an "unhealthy" diet Current exercise: recumbant bike for 3 miles about 2-3 days per week  Current monitoring regimen: checked BG daily Home blood sugar records: did not bring in glucometer today but reports am BG is 80 to 110 Any episodes of hypoglycemia? no  Is He on ACE inhibitor or angiotensin II receptor blocker?  Yes    Azor 10/40mg  which containt ARB - Benicar + amlodipine  The following portions of the patient's history were reviewed and updated as appropriate: allergies, current medications, past family history, past medical history, past social history, past surgical history and problem list.    Objective:    BP 140/86 mmHg  Pulse 80  Ht  (1.88 m)  Wt 403 lb 6.4 oz (182.981 kg)  BMI 51.77 kg/m2   A1c = 8.3% (09/11/2015) Urine microalbumin was WNL 09/11/2015  Lab Review GLUCOSE (mg/dL)  Date Value  40/98/1191 129*  11/10/2014 73  07/19/2014 65   CO2 (mmol/L)  Date Value  09/11/2015 28  11/10/2014 29  07/19/2014 27   BUN (mg/dL)  Date Value  47/82/9562 24  11/10/2014 26*  07/19/2014 24   CREATININE, SER (mg/dL)  Date Value  13/03/6577 1.76*   11/10/2014 1.42*  07/19/2014 1.74*    Assessment:    Diabetes Mellitus type II, under inadequate control.   Obesity with increased weight over last month.  HTN - BP was 140/86 today - lower than last visit but goal is less than 140/90  Plan:    1.  Rx changes: continue with current diabetes medications.  We did discuss GLP1 medications and if A1c is not at goal at next visit then consider GLP1/basal insulin combo  or adding weekly GLP1 2.  Education: Reviewed 'ABCs' of diabetes management (respective goals in parentheses):  A1C (<7), blood pressure (<130/80), and cholesterol (LDL <100). 3.  Identified several areas to change diet that will help with weight, BP and BG.  Discussed limiting sweets and other high CHO foods.  Patient is to decrease meal / portion sizes. We also discussed low calories / low CHO snacking options that he can have between meals when hungry. 4.  Patient is agreeable to increase exercise to 4 to 5 days  Per week as his physical health / knee pain allows. 5.  RTC in 2 months to see Dr Ermalinda Memos and 4 months to see me.   Henrene Pastor, PharmD, CPP, CDE

## 2015-10-19 ENCOUNTER — Other Ambulatory Visit: Payer: Self-pay | Admitting: Family Medicine

## 2015-11-10 ENCOUNTER — Ambulatory Visit: Payer: BLUE CROSS/BLUE SHIELD | Admitting: Family Medicine

## 2015-11-20 ENCOUNTER — Other Ambulatory Visit: Payer: Self-pay | Admitting: Family Medicine

## 2015-11-22 ENCOUNTER — Other Ambulatory Visit: Payer: Self-pay | Admitting: Family Medicine

## 2015-11-22 ENCOUNTER — Telehealth: Payer: Self-pay | Admitting: Family Medicine

## 2015-11-22 MED ORDER — INSULIN GLARGINE 100 UNIT/ML SOLOSTAR PEN: PEN_INJECTOR | SUBCUTANEOUS | Status: DC

## 2015-11-22 NOTE — Telephone Encounter (Signed)
Patient called stating that he was having to pay $70 for Lantus because rx was written for to much and insurance would not cover.  Rx changed and resent to pharmacy

## 2015-12-12 ENCOUNTER — Ambulatory Visit (INDEPENDENT_AMBULATORY_CARE_PROVIDER_SITE_OTHER): Payer: BLUE CROSS/BLUE SHIELD | Admitting: Family Medicine

## 2015-12-12 ENCOUNTER — Encounter: Payer: Self-pay | Admitting: Family Medicine

## 2015-12-12 VITALS — BP 140/79 | HR 78 | Temp 97.2°F | Ht 74.0 in | Wt >= 6400 oz

## 2015-12-12 DIAGNOSIS — E119 Type 2 diabetes mellitus without complications: Secondary | ICD-10-CM | POA: Diagnosis not present

## 2015-12-12 DIAGNOSIS — I10 Essential (primary) hypertension: Secondary | ICD-10-CM

## 2015-12-12 LAB — BAYER DCA HB A1C WAIVED: HB A1C (BAYER DCA - WAIVED): 7.7 % — ABNORMAL HIGH (ref <7.0)

## 2015-12-12 NOTE — Patient Instructions (Addendum)
Great to see you!  Lets plan on checking your A1C every 3 months, we will hold off on new meds as long as we make progress.    Keep up the good work!

## 2015-12-12 NOTE — Progress Notes (Signed)
   HPI  Patient presents today for follow-up diabetes, hypertension, obesity.  Diabetes Taking 70 units of Lantus daily, he has gone back to his old dose Has modified diet and exercise quite a bit. No hypoglycemia Average fasting is 80-110. Postprandials are less than formal checksl but 150s  Hypertension Good medication compliance No chest pain, dyspnea, palpitations, leg edema.  Obesity He enjoyed his visit with our clinical pharmacist, he states that the exercise and diet actually her work quite a bit, and last visit he's lost 15 pounds and regained some whenever he could not continue exercising.    PMH: Smoking status noted ROS: Per HPI  Objective: BP 140/79 mmHg  Pulse 78  Temp(Src) 97.2 F (36.2 C) (Oral)  Ht 6\' 2"  (1.88 m)  Wt 401 lb (181.892 kg)  BMI 51.46 kg/m2 Gen: NAD, alert, cooperative with exam HEENT: NCAT CV: RRR, good S1/S2, no murmur Resp: CTABL, no wheezes, non-labored  Ext: No edema, warm Neuro: Alert and oriented, No gross deficits  Diabetic Foot Exam - Simple   Simple Foot Form  Visual Inspection  No deformities, no ulcerations, no other skin breakdown bilaterally:  Yes  Sensation Testing  Intact to touch and monofilament testing bilaterally:  Yes  Pulse Check  Posterior Tibialis and Dorsalis pulse intact bilaterally:  Yes  Comments       Assessment and plan:  # Type 2 diabetes A1c pending No changes for now, if steadily improving will continue diet and exercise Consider weekly GLP as next step  # Hypertension Reasonably well-controlled No changes, continue amlodipine, olmesartan, carvedilol, clonidine, Lasix   # Obesity Discussed diet and exercise, he feels like he has the tools to make good progress, encouraged     Murtis SinkSam Elsie Sakuma, MD [facility] 12/12/2015, 8:14 AM

## 2015-12-21 ENCOUNTER — Encounter: Payer: Self-pay | Admitting: *Deleted

## 2015-12-29 ENCOUNTER — Telehealth: Payer: Self-pay | Admitting: Family Medicine

## 2015-12-29 ENCOUNTER — Other Ambulatory Visit: Payer: Self-pay

## 2015-12-29 DIAGNOSIS — I10 Essential (primary) hypertension: Secondary | ICD-10-CM

## 2015-12-29 DIAGNOSIS — E119 Type 2 diabetes mellitus without complications: Secondary | ICD-10-CM

## 2015-12-29 MED ORDER — FUROSEMIDE 80 MG PO TABS: 80.0000 mg | ORAL_TABLET | Freq: Two times a day (BID) | ORAL | Status: DC

## 2015-12-29 NOTE — Addendum Note (Signed)
Addended by: Caryl BisBOWMAN, Javeon Macmurray M on: 12/29/2015 10:06 AM   Modules accepted: Orders

## 2015-12-29 NOTE — Telephone Encounter (Signed)
Patient needs refill on med. Patient told pharmacy that directions were wrong. Looked back at note and didn't see any changes. Please advise

## 2015-12-29 NOTE — Telephone Encounter (Signed)
Patient aware that medication ha been sent to pharmacy.  Also informed patient that Dr. Ermalinda MemosBradshaw recommends that he comes in to have labs drawn to check renal function.   Orders placed. Patient verbalized understanding.

## 2015-12-29 NOTE — Telephone Encounter (Signed)
Patient informed that Lasix is prescribed as 80 mg twice daily, patient voices understanding

## 2016-01-01 ENCOUNTER — Telehealth: Payer: Self-pay | Admitting: Family Medicine

## 2016-01-02 NOTE — Telephone Encounter (Signed)
Patient aware to take lasix twice dail

## 2016-01-22 ENCOUNTER — Other Ambulatory Visit: Payer: Self-pay | Admitting: Family Medicine

## 2016-02-08 ENCOUNTER — Ambulatory Visit: Payer: Self-pay | Admitting: Pharmacist

## 2016-02-09 ENCOUNTER — Ambulatory Visit: Payer: Self-pay | Admitting: Pharmacist

## 2016-02-22 ENCOUNTER — Other Ambulatory Visit: Payer: Self-pay | Admitting: *Deleted

## 2016-02-22 MED ORDER — INSULIN PEN NEEDLE 29G X 12.7MM MISC: Status: DC

## 2016-02-29 ENCOUNTER — Encounter: Payer: Self-pay | Admitting: Family Medicine

## 2016-02-29 ENCOUNTER — Ambulatory Visit (INDEPENDENT_AMBULATORY_CARE_PROVIDER_SITE_OTHER): Payer: BLUE CROSS/BLUE SHIELD | Admitting: Family Medicine

## 2016-02-29 VITALS — BP 146/85 | HR 91 | Temp 97.2°F | Ht 74.0 in | Wt >= 6400 oz

## 2016-02-29 DIAGNOSIS — I878 Other specified disorders of veins: Secondary | ICD-10-CM

## 2016-02-29 DIAGNOSIS — J00 Acute nasopharyngitis [common cold]: Secondary | ICD-10-CM | POA: Diagnosis not present

## 2016-02-29 NOTE — Progress Notes (Signed)
   HPI  Patient presents today here with weakening of the right lower extremity.  Patient's explains that he went to the gym last night and did not wear his compression stockings afterward. This morning he started having some weeping from his right lower extremity. He wrapped it up with an ABD pad and gauze and were his compression stockings all day.  Denies any fever, chills, sweats, or specific lesions. He responded very well to an [employer] boot, however he would like to avoid this if possible.  He's had congestion for about 2 weeks, it's improving. He does not like using nasal sprays  No shortness of breath, facial pain, or cough.  PMH: Smoking status noted ROS: Per HPI  Objective: BP 146/85 mmHg  Pulse 91  Temp(Src) 97.2 F (36.2 C) (Oral)  Ht 6\' 2"  (1.88 m)  Wt 416 lb (188.696 kg)  BMI 53.39 kg/m2 Gen: NAD, alert, cooperative with exam HEENT: NCAT, nares with swelling of the turbinates bilaterally, TMs normal bilaterally, no tenderness to palpation of bilateral Sinuses CV: RRR, good S1/S2, no murmur Resp: CTABL, no wheezes, non-labored Abd: SNTND, BS present, no guarding or organomegaly Ext: Severe edema bilaterally, tense with very little pitting 1-2+ Neuro: Alert and oriented, No gross deficits  Right lower extremity dressing taken down, the area where the dressing was was more flaccid, he has clear fluid oozing from several sites with no discrete injuries or lesions.   Assessment and plan:  # Venous stasis of bilateral lower extremities, weeping on the right Considering how flaccid the area is not alleviated his teething good compression with his compression stockings plus gauze He would like to avoid getting an Unna boot if he can, he will return in 3-4 days if his symptoms do not completely improve. Continue wrapping with gauze and an absorbent pad and continue wearing compression stockings. No signs of infection.  # Common cold Some nasal congestion with swollen  turbinates Recommended daily Allegra to help congestion, also consider Nasacort or Flonase No Signs of bacterial infection   Murtis SinkSam Bradshaw, MD [facility] 02/29/2016, 5:51 PM

## 2016-03-12 ENCOUNTER — Ambulatory Visit (INDEPENDENT_AMBULATORY_CARE_PROVIDER_SITE_OTHER): Payer: BLUE CROSS/BLUE SHIELD | Admitting: Family Medicine

## 2016-03-12 ENCOUNTER — Encounter: Payer: Self-pay | Admitting: Family Medicine

## 2016-03-12 VITALS — BP 145/82 | HR 79 | Temp 97.3°F | Ht 74.0 in | Wt >= 6400 oz

## 2016-03-12 DIAGNOSIS — I10 Essential (primary) hypertension: Secondary | ICD-10-CM | POA: Diagnosis not present

## 2016-03-12 DIAGNOSIS — E119 Type 2 diabetes mellitus without complications: Secondary | ICD-10-CM

## 2016-03-12 LAB — BAYER DCA HB A1C WAIVED: HB A1C: 7.6 % — AB (ref <7.0)

## 2016-03-12 MED ORDER — FUROSEMIDE 80 MG PO TABS: 80.0000 mg | ORAL_TABLET | Freq: Two times a day (BID) | ORAL | 5 refills | Status: DC

## 2016-03-12 MED ORDER — CANAGLIFLOZIN 300 MG PO TABS: 300.0000 mg | ORAL_TABLET | Freq: Every day | ORAL | 5 refills | Status: DC

## 2016-03-12 MED ORDER — LIRAGLUTIDE 18 MG/3ML ~~LOC~~ SOPN: 1.2000 mg | PEN_INJECTOR | Freq: Every day | SUBCUTANEOUS | 5 refills | Status: DC

## 2016-03-12 NOTE — Patient Instructions (Signed)
Great to see you!  I have started you on Victoza which has ben shown to help people lose weight as well as control diabetes  Lets have you come back to see the clinical pharmacist in 1 month, come back for regular follow up in 3 months

## 2016-03-12 NOTE — Progress Notes (Signed)
   HPI  Patient presents today here to follow-up for diabetes, hypertension, obesity.  Diabetes Average fasting blood sugars 85-110, postprandials 180-200. Hypoglycemia 2-3 times with symptoms, measuring 60-70. He tolerates medications very well has good medication compliance, 70 units of Lantus daily, Invega, daily as well.  Hypertension Good medication compliance No chest pain, dyspnea, palpitations His leg edema is stable, chronically right greater than left.  Obesity Patient's frustrated, he is exercising and was losing weight until he had a flare of his left knee pain. He would like to try additional medications if they can help him lose weight.  PMH: Smoking status noted ROS: Per HPI  Objective: BP (!) 145/82   Pulse 79   Temp 97.3 F (36.3 C) (Oral)   Ht '6\' 2"'$  (1.88 m)   Wt (!) 418 lb 3.2 oz (189.7 kg)   BMI 53.69 kg/m  Gen: NAD, alert, cooperative with exam HEENT: NCAT CV: RRR, good S1/S2, no murmur Resp: CTABL, no wheezes, non-labored Ext: Right greater than left pitting edema, compression stockings in place, 1-2+ pitting edema bilaterally Neuro: Alert and oriented, No gross deficits  Assessment and plan:  # Type 2 diabetes A1c pending Adding Victoza today, hopefully this will help him lose some weight and will at least reduce his insulin dosing. Discussed watching average sugars, decrease Lantus to 65 units daily if he develops any hypoglycemia, otherwise consider admission for better control and maintain current Lantus dose.  # Hypertension Borderline/elevated today Multiple medications including amlodipine, losartan, Coreg, clonidine, and Lasix. Appears to have chronic kidney disease stage III, rechecking labs  # Morbid obesity Adding GLP today, discussed reducing calories and goal of 150 minutes of aerobic exercise every week.   Orders Placed This Encounter  Procedures  . Bayer DCA Hb A1c Waived  . BMP8+EGFR  . CBC with Differential  . Lipid  panel    Meds ordered this encounter  Medications  . furosemide (LASIX) 80 MG tablet    Sig: Take 1 tablet (80 mg total) by mouth 2 (two) times daily.    Dispense:  60 tablet    Refill:  5  . canagliflozin (INVOKANA) 300 MG TABS tablet    Sig: Take 1 tablet (300 mg total) by mouth daily.    Dispense:  30 tablet    Refill:  5  . Liraglutide (VICTOZA) 18 MG/3ML SOPN    Sig: Inject 0.2 mLs (1.2 mg total) into the skin daily.    Dispense:  6 mL    Refill:  [place], MD [facility] 03/12/2016, 8:39 AM

## 2016-03-13 LAB — CBC WITH DIFFERENTIAL/PLATELET
BASOS: 1 %
Basophils Absolute: 0 10*3/uL (ref 0.0–0.2)
EOS (ABSOLUTE): 0.3 10*3/uL (ref 0.0–0.4)
EOS: 4 %
HEMATOCRIT: 41.3 % (ref 37.5–51.0)
HEMOGLOBIN: 13.6 g/dL (ref 12.6–17.7)
Immature Grans (Abs): 0 10*3/uL (ref 0.0–0.1)
Immature Granulocytes: 0 %
LYMPHS ABS: 2.2 10*3/uL (ref 0.7–3.1)
Lymphs: 33 %
MCH: 25.9 pg — ABNORMAL LOW (ref 26.6–33.0)
MCHC: 32.9 g/dL (ref 31.5–35.7)
MCV: 79 fL (ref 79–97)
MONOCYTES: 14 %
Monocytes Absolute: 0.9 10*3/uL (ref 0.1–0.9)
NEUTROS ABS: 3.1 10*3/uL (ref 1.4–7.0)
Neutrophils: 48 %
Platelets: 279 10*3/uL (ref 150–379)
RBC: 5.26 x10E6/uL (ref 4.14–5.80)
RDW: 15.5 % — ABNORMAL HIGH (ref 12.3–15.4)
WBC: 6.6 10*3/uL (ref 3.4–10.8)

## 2016-03-13 LAB — BMP8+EGFR
BUN / CREAT RATIO: 17 (ref 9–20)
BUN: 26 mg/dL — AB (ref 6–24)
CHLORIDE: 99 mmol/L (ref 96–106)
CO2: 29 mmol/L (ref 18–29)
CREATININE: 1.57 mg/dL — AB (ref 0.76–1.27)
Calcium: 9.4 mg/dL (ref 8.7–10.2)
GFR calc non Af Amer: 49 mL/min/{1.73_m2} — ABNORMAL LOW (ref >59)
GFR, EST AFRICAN AMERICAN: 57 mL/min/{1.73_m2} — AB (ref >59)
Glucose: 85 mg/dL (ref 65–99)
Potassium: 4.6 mmol/L (ref 3.5–5.2)
Sodium: 142 mmol/L (ref 134–144)

## 2016-03-13 LAB — LIPID PANEL
CHOL/HDL RATIO: 3.5 ratio (ref 0.0–5.0)
Cholesterol, Total: 114 mg/dL (ref 100–199)
HDL: 33 mg/dL — AB (ref >39)
LDL CALC: 58 mg/dL (ref 0–99)
TRIGLYCERIDES: 113 mg/dL (ref 0–149)
VLDL Cholesterol Cal: 23 mg/dL (ref 5–40)

## 2016-03-21 ENCOUNTER — Other Ambulatory Visit: Payer: Self-pay | Admitting: Family Medicine

## 2016-04-04 ENCOUNTER — Other Ambulatory Visit: Payer: Self-pay

## 2016-04-04 MED ORDER — ROSUVASTATIN CALCIUM 5 MG PO TABS: 5.0000 mg | ORAL_TABLET | Freq: Every day | ORAL | 1 refills | Status: DC

## 2016-04-06 ENCOUNTER — Other Ambulatory Visit: Payer: Self-pay | Admitting: Family Medicine

## 2016-04-11 ENCOUNTER — Ambulatory Visit (INDEPENDENT_AMBULATORY_CARE_PROVIDER_SITE_OTHER): Payer: BLUE CROSS/BLUE SHIELD | Admitting: Pharmacist

## 2016-04-11 VITALS — BP 136/90 | HR 78

## 2016-04-11 DIAGNOSIS — E119 Type 2 diabetes mellitus without complications: Secondary | ICD-10-CM

## 2016-04-11 DIAGNOSIS — I10 Essential (primary) hypertension: Secondary | ICD-10-CM | POA: Diagnosis not present

## 2016-04-11 MED ORDER — INSULIN GLARGINE-LIXISENATIDE 100-33 UNT-MCG/ML ~~LOC~~ SOPN: 30.0000 [IU] | PEN_INJECTOR | Freq: Every day | SUBCUTANEOUS | 1 refills | Status: DC

## 2016-04-11 NOTE — Patient Instructions (Addendum)
Start Victoza that you have at home - inject 0.6mg  daily for 1 week, then increase to 1.2mg  daily.   Decrease Lantus to 65 units daily  Once finished with Victoza / Lantus then switch to [facility] - start with 30 units daily and increase by 4 units weekly until blood glucose in the morning is 120 or less.   Call end of August to schedule appointment for influenza vaccine

## 2016-04-11 NOTE — Progress Notes (Signed)
Patient ID: Jon Moore, male   DOB: 1961/05/02, 55 y.o.   MRN: [id number] Subjective:    Jon Moore is a 55 y.o. male who presents for re-evaluation of Type 2 diabetes mellitus.  Current symptoms/problems include hyperglycemia and have been improving over the last 6 months.    Known diabetic complications: none Cardiovascular risk factors: advanced age (older than 6855 for men, 5965 for women), diabetes mellitus, dyslipidemia, hypertension, male gender and obesity (BMI >= 30 kg/m2) Current diabetic medications include Invokana 300mg  1 tablet daily; metformin 1000mg  bid and Lantus 70 units once daily.  He was prescribed Victoza but has not started yet because he was concerned about side effects and also where to inject since he is also injecting Lantus daily  Eye exam current (within one year): yes Weight trend: increasing steadily (has increased about 18 lbs since January 2017 Prior visit with CDE: yes  Current diet: improving regarding CHO choices but continues to struggle with limiting serving sizes and meal size Current exercise: none and he was exercising regularly unti he hurt his wrist - feeling better so planning to restart but at slower pace.  Current monitoring regimen: checked BG daily Home blood sugar records: did not bring in glucometer today but reports am BG is 80 to 110 Any episodes of hypoglycemia? no  Is He on ACE inhibitor or angiotensin II receptor blocker?  Yes    Azor 10/40mg  which containt ARB - Benicar + amlodipine  The following portions of the patient's history were reviewed and updated as appropriate: allergies, current medications, past family history, past medical history, past social history, past surgical history and problem list.    Objective:    BP 136/90 (Cuff Size: Large)   Pulse 78    A1c = 7.6% (03/12/2016) A1c = 8.3% (09/11/2015) Urine microalbumin was WNL 09/11/2015  Lab Review Glucose (mg/dL)  Date Value  78/29/562107/25/2017 85  09/11/2015 129 (H)   11/10/2014 73   CO2 (mmol/L)  Date Value  03/12/2016 29  09/11/2015 28  11/10/2014 29   BUN (mg/dL)  Date Value  30/86/578407/25/2017 26 (H)  09/11/2015 24  11/10/2014 26 (H)   Creatinine, Ser (mg/dL)  Date Value  69/62/952807/25/2017 1.57 (H)  09/11/2015 1.76 (H)  11/10/2014 1.42 (H)    Assessment:    Diabetes Mellitus type II, under inadequate control but has improved over the last 6 months  Obesity with increased weight over last month.  HTN  - stable  Plan:    1.  Rx changes:   Since has Victoza at home recommend start Victoaz 0.6mg  qd for 1 week, then increase to 1.2mg  daily.  After finished with Victoza will switch to [facility] - start with 30 units qd and increase by 4 units per week until FBG is less than 120.  Patient is advised to stop both Lantus and Victoza when starts NigerSoliqua.  2.  Education: Reviewed 'ABCs' of diabetes management (respective goals in parentheses):  A1C (<7), blood pressure (<130/80), and cholesterol (LDL <100). 3.  Reviewed CHO counting and recommended serving sizes -  In particular discussed limiting sweets and decrease meal / portion sizes. GLP-1 agonists should help with this also.  4.  Restart exercise but go slow - recommend start with walking 20 - 30 minutes daily and increase as able.  5.  RTC in 2 months to see Dr Ermalinda MemosBradshaw - will follow up by phone with patient in 2 weeks.   Henrene Pastorammy Trinette Vera, PharmD, CPP, CDE

## 2016-04-22 ENCOUNTER — Other Ambulatory Visit: Payer: Self-pay | Admitting: Family Medicine

## 2016-05-06 ENCOUNTER — Telehealth: Payer: Self-pay | Admitting: Pharmacist

## 2016-05-06 NOTE — Telephone Encounter (Signed)
Patient has restarted victoza and if tolerating well.  His BG average is in the 150's.  He will start soliqua in 2 days and I reminded him that this replaces both Lantus and Victoza.  Follow up is planned with Dr Ermalinda MemosBradshaw in 2 months.

## 2016-05-09 ENCOUNTER — Telehealth: Payer: Self-pay | Admitting: Family Medicine

## 2016-05-09 NOTE — Telephone Encounter (Signed)
Recommend change back to Victoza and Lantus.  We were trying to combine the two by using Soliqua.

## 2016-05-23 NOTE — Telephone Encounter (Signed)
Detailed message left for patient with recommendations. 

## 2016-05-24 ENCOUNTER — Telehealth: Payer: Self-pay | Admitting: Family Medicine

## 2016-05-27 NOTE — Telephone Encounter (Signed)
LM for patient.  Excited that Jon Moore was approved so he now only has one injection per day.  Recommended he start with 30 units daily and increase every 5 to 7 days by 4 units until FBG was 120 or less.  Patient to call office with questions.

## 2016-06-14 ENCOUNTER — Ambulatory Visit (INDEPENDENT_AMBULATORY_CARE_PROVIDER_SITE_OTHER): Payer: BLUE CROSS/BLUE SHIELD | Admitting: Family Medicine

## 2016-06-14 ENCOUNTER — Encounter: Payer: Self-pay | Admitting: Family Medicine

## 2016-06-14 VITALS — BP 139/77 | HR 88 | Temp 97.1°F | Ht 74.0 in | Wt 387.2 lb

## 2016-06-14 DIAGNOSIS — I10 Essential (primary) hypertension: Secondary | ICD-10-CM | POA: Diagnosis not present

## 2016-06-14 DIAGNOSIS — Z23 Encounter for immunization: Secondary | ICD-10-CM

## 2016-06-14 DIAGNOSIS — E118 Type 2 diabetes mellitus with unspecified complications: Secondary | ICD-10-CM

## 2016-06-14 LAB — BAYER DCA HB A1C WAIVED: HB A1C (BAYER DCA - WAIVED): 6.5 % (ref <7.0)

## 2016-06-14 NOTE — Patient Instructions (Signed)
Great job! Keep up the good work! 

## 2016-06-14 NOTE — Progress Notes (Signed)
   HPI  Patient presents today follow-up for diabetes, hypertension, obesity.  Patient is very happy with his success so far, he is down from 418 pounds to 387 pounds this visit.  He is watching his diet, he is exercising once a week and occupationally active.  Diabetes Average fasting is 90-110 Not checking postprandials No hypoglycemia or foot numbness.  Hypertension Average blood pressure check at home is 4:45 to 848 systolic over 35Y diastolic Chest pain, dyspnea, palpitations Leg edema is stable   PMH: Smoking status noted ROS: Per HPI  Objective: BP 139/77   Pulse 88   Temp 97.1 F (36.2 C) (Oral)   Ht '6\' 2"'$  (1.88 m)   Wt (!) 387 lb 3.2 oz (175.6 kg)   BMI 49.71 kg/m  Gen: NAD, alert, cooperative with exam HEENT: NCAT CV: RRR, good S1/S2, no murmur Resp: CTABL, no wheezes, non-labored Ext: Aggression stockings in place, right lower extremity with 1+ pitting edema, left lower extremity large but no pitting edema Neuro: Alert and oriented, No gross deficits  Assessment and plan:  # Type 2 diabetes Controlled, improving A1c 6.5 Continue basal insulin plus GLP combination, metformin Checking labs with CK D stage III on metformin   # Hypertension Controlled Continue Lasix, amlodipine, ARB Rechecking labs  # Morbid obesity Improving, congratulated on his success Continue watching diet and exercising     Orders Placed This Encounter  Procedures  . Flu Vaccine QUAD 36+ mos IM  . Bayer DCA Hb A1c Waived  . [place], MD [facility] 06/14/2016, 8:38 AM

## 2016-06-15 LAB — BMP8+EGFR
BUN/Creatinine Ratio: 14 (ref 9–20)
BUN: 27 mg/dL — ABNORMAL HIGH (ref 6–24)
CALCIUM: 9.6 mg/dL (ref 8.7–10.2)
CO2: 26 mmol/L (ref 18–29)
Chloride: 99 mmol/L (ref 96–106)
Creatinine, Ser: 1.93 mg/dL — ABNORMAL HIGH (ref 0.76–1.27)
GFR, EST AFRICAN AMERICAN: 44 mL/min/{1.73_m2} — AB (ref >59)
GFR, EST NON AFRICAN AMERICAN: 38 mL/min/{1.73_m2} — AB (ref >59)
Glucose: 86 mg/dL (ref 65–99)
POTASSIUM: 4.2 mmol/L (ref 3.5–5.2)
Sodium: 141 mmol/L (ref 134–144)

## 2016-06-20 ENCOUNTER — Other Ambulatory Visit: Payer: Self-pay | Admitting: Family Medicine

## 2016-08-09 ENCOUNTER — Other Ambulatory Visit: Payer: Self-pay | Admitting: Pharmacist

## 2016-08-16 LAB — HM DIABETES EYE EXAM

## 2016-09-16 ENCOUNTER — Other Ambulatory Visit: Payer: Self-pay | Admitting: Family Medicine

## 2016-09-18 ENCOUNTER — Encounter: Payer: Self-pay | Admitting: Family Medicine

## 2016-09-18 ENCOUNTER — Ambulatory Visit (INDEPENDENT_AMBULATORY_CARE_PROVIDER_SITE_OTHER): Payer: BLUE CROSS/BLUE SHIELD | Admitting: Family Medicine

## 2016-09-18 VITALS — BP 139/82 | HR 70 | Temp 97.1°F | Ht 74.0 in | Wt 383.6 lb

## 2016-09-18 DIAGNOSIS — R430 Anosmia: Secondary | ICD-10-CM

## 2016-09-18 DIAGNOSIS — E119 Type 2 diabetes mellitus without complications: Secondary | ICD-10-CM | POA: Diagnosis not present

## 2016-09-18 LAB — BAYER DCA HB A1C WAIVED: HB A1C: 7.1 % — AB (ref <7.0)

## 2016-09-18 MED ORDER — MOMETASONE FUROATE 50 MCG/ACT NA SUSP: 2.0000 | Freq: Every day | NASAL | 12 refills | Status: DC

## 2016-09-18 NOTE — Patient Instructions (Addendum)
Great to see you!  Keep up the good work watching your diet.   Try nasonex for the sense of smell.   Come back in 3 months

## 2016-09-18 NOTE — Progress Notes (Signed)
   HPI  Patient presents today here for follow-up of chronic medical conditions and loss of sense of smell.  Loss of sense of smell started about 4-5 years ago after hospitalization. He states for a few years he couldn't get the smell hospital out of his nose. Now he has intermittent improvement in smell, he has normal taste. Sometimes he has nasal congestion which was held by Nasonex previously.  Type 2 diabetes Patient admits he's had some dietary indiscretion over Christmas. He is less active than he was previously due to ankle and knee pain.  He is excited that he's lost some weight.  He still watching his diet very closely  PMH: Smoking status noted ROS: Per HPI  Objective: BP 139/82   Pulse 70   Temp 97.1 F (36.2 C) (Oral)   Ht _0  (1.88 m)   Wt (!) 383 lb 9.6 oz (174 kg)   BMI 49.25 kg/m  Gen: NAD, alert, cooperative with exam HEENT: NCAT, turbinates swollen bilaterally CV: RRR, good S1/S2, no murmur Resp: CTABL, no wheezes, non-labored Neuro: Alert and oriented, No gross deficits  Diabetic Foot Exam - Simple   Simple Foot Form Diabetic Foot exam was performed with the following findings:  Yes 09/18/2016  8:59 AM  Visual Inspection No deformities, no ulcerations, no other skin breakdown bilaterally:  Yes Sensation Testing Intact to touch and monofilament testing bilaterally:  Yes Pulse Check Posterior Tibialis and Dorsalis pulse intact bilaterally:  Yes Comments      Assessment and plan:  # Type 2 diabetes Control slipping slightly, however dietary indiscretions likely the source, A1c 7.1, previously 6.5. Continue SGL T2, GLP and basal insulin combo, and metformin  # anosmia Chronic, recommended Nasonex to help with turbinate swelling   # Morbid obesity Improved, down 4 pounds from last visit with GLP and diet intervention Discussed exercise as much as tolerated    Orders Placed This Encounter  Procedures  . Bayer DCA Hb A1c Waived  .  CMP14+EGFR  . Lipid Panel  . CBC with Differential/Platelet    Meds ordered this encounter  Medications  . mometasone (NASONEX) 50 MCG/ACT nasal spray    Sig: Place 2 sprays into the nose daily.    Dispense:  17 g    Refill:  [place], MD [facility] 09/18/2016, 8:56 AM

## 2016-09-19 LAB — CBC WITH DIFFERENTIAL/PLATELET
BASOS ABS: 0.1 10*3/uL (ref 0.0–0.2)
Basos: 1 %
EOS (ABSOLUTE): 0.3 10*3/uL (ref 0.0–0.4)
Eos: 5 %
HEMOGLOBIN: 15.1 g/dL (ref 13.0–17.7)
Hematocrit: 43 % (ref 37.5–51.0)
Immature Grans (Abs): 0 10*3/uL (ref 0.0–0.1)
Immature Granulocytes: 0 %
LYMPHS ABS: 2.4 10*3/uL (ref 0.7–3.1)
Lymphs: 37 %
MCH: 27.8 pg (ref 26.6–33.0)
MCHC: 35.1 g/dL (ref 31.5–35.7)
MCV: 79 fL (ref 79–97)
Monocytes Absolute: 0.9 10*3/uL (ref 0.1–0.9)
Monocytes: 14 %
NEUTROS ABS: 2.8 10*3/uL (ref 1.4–7.0)
Neutrophils: 43 %
Platelets: 271 10*3/uL (ref 150–379)
RBC: 5.43 x10E6/uL (ref 4.14–5.80)
RDW: 15.5 % — ABNORMAL HIGH (ref 12.3–15.4)
WBC: 6.4 10*3/uL (ref 3.4–10.8)

## 2016-09-19 LAB — LIPID PANEL
CHOL/HDL RATIO: 4.6 ratio (ref 0.0–5.0)
Cholesterol, Total: 130 mg/dL (ref 100–199)
HDL: 28 mg/dL — AB (ref >39)
LDL Calculated: 60 mg/dL (ref 0–99)
Triglycerides: 210 mg/dL — ABNORMAL HIGH (ref 0–149)
VLDL CHOLESTEROL CAL: 42 mg/dL — AB (ref 5–40)

## 2016-09-19 LAB — CMP14+EGFR
A/G RATIO: 1.1 — AB (ref 1.2–2.2)
ALT: 18 IU/L (ref 0–44)
AST: 18 IU/L (ref 0–40)
Albumin: 3.9 g/dL (ref 3.5–5.5)
Alkaline Phosphatase: 121 IU/L — ABNORMAL HIGH (ref 39–117)
BUN/Creatinine Ratio: 16 (ref 9–20)
BUN: 25 mg/dL — AB (ref 6–24)
Bilirubin Total: 0.5 mg/dL (ref 0.0–1.2)
CO2: 27 mmol/L (ref 18–29)
CREATININE: 1.52 mg/dL — AB (ref 0.76–1.27)
Calcium: 9.6 mg/dL (ref 8.7–10.2)
Chloride: 100 mmol/L (ref 96–106)
GFR, EST AFRICAN AMERICAN: 58 mL/min/{1.73_m2} — AB (ref >59)
GFR, EST NON AFRICAN AMERICAN: 50 mL/min/{1.73_m2} — AB (ref >59)
GLUCOSE: 120 mg/dL — AB (ref 65–99)
Globulin, Total: 3.7 g/dL (ref 1.5–4.5)
Potassium: 4.5 mmol/L (ref 3.5–5.2)
Sodium: 141 mmol/L (ref 134–144)
TOTAL PROTEIN: 7.6 g/dL (ref 6.0–8.5)

## 2016-09-23 ENCOUNTER — Telehealth: Payer: Self-pay | Admitting: Family Medicine

## 2016-09-24 NOTE — Telephone Encounter (Signed)
Will work on this  I have received nothing from the pharmacy

## 2016-09-25 ENCOUNTER — Other Ambulatory Visit: Payer: Self-pay | Admitting: *Deleted

## 2016-09-25 ENCOUNTER — Telehealth: Payer: Self-pay | Admitting: Family Medicine

## 2016-09-26 MED ORDER — INSULIN GLARGINE-LIXISENATIDE 100-33 UNT-MCG/ML ~~LOC~~ SOPN: 30.0000 [IU] | PEN_INJECTOR | Freq: Every day | SUBCUTANEOUS | 1 refills | Status: DC

## 2016-09-26 NOTE — Telephone Encounter (Signed)
Please advise if patient should have another script of insulin to use until insurance  approval for soliqua.

## 2016-09-26 NOTE — Telephone Encounter (Signed)
Lantus given - 1 pen. Pt will take in the interim.   Murtis SinkSam Kendrew Paci, MD [facility] 09/26/2016, 5:31 PM

## 2016-09-26 NOTE — Telephone Encounter (Signed)
Had #1 sample of Soliqua.until PA approved.

## 2016-10-02 ENCOUNTER — Telehealth: Payer: Self-pay | Admitting: Family Medicine

## 2016-10-02 NOTE — Telephone Encounter (Signed)
Pt is having trouble getting his Lawana ChambersSoliqua He has always gotten it with 0 copay  - covered by insurance. This is the first time for 2018 that he has tried to fill it and now [facility] states it needs prior approval. The pharm also states there is no coupon on file for this med there and the pt states otherwise. [facility] is faxing us a prior approval and the pt will take another coupon card to [facility] He is aware to call Eunice BlaseDebbie if he doesn't have meds in a day or two. She hopefully can get this approved  / or the coupon may help.

## 2016-10-14 ENCOUNTER — Other Ambulatory Visit: Payer: Self-pay | Admitting: Family Medicine

## 2016-11-07 ENCOUNTER — Other Ambulatory Visit: Payer: Self-pay | Admitting: Family Medicine

## 2016-11-15 ENCOUNTER — Other Ambulatory Visit: Payer: Self-pay | Admitting: Family Medicine

## 2016-11-19 ENCOUNTER — Other Ambulatory Visit: Payer: Self-pay | Admitting: Family Medicine

## 2016-12-12 ENCOUNTER — Other Ambulatory Visit: Payer: Self-pay | Admitting: Family Medicine

## 2016-12-17 ENCOUNTER — Ambulatory Visit: Payer: BLUE CROSS/BLUE SHIELD | Admitting: Family Medicine

## 2016-12-23 ENCOUNTER — Other Ambulatory Visit: Payer: Self-pay | Admitting: Family Medicine

## 2016-12-24 ENCOUNTER — Other Ambulatory Visit: Payer: Self-pay | Admitting: Family Medicine

## 2017-01-02 ENCOUNTER — Ambulatory Visit (INDEPENDENT_AMBULATORY_CARE_PROVIDER_SITE_OTHER): Payer: BLUE CROSS/BLUE SHIELD | Admitting: Family Medicine

## 2017-01-02 ENCOUNTER — Encounter: Payer: Self-pay | Admitting: Family Medicine

## 2017-01-02 VITALS — BP 123/73 | HR 73 | Temp 97.1°F | Ht 74.0 in

## 2017-01-02 DIAGNOSIS — N529 Male erectile dysfunction, unspecified: Secondary | ICD-10-CM | POA: Diagnosis not present

## 2017-01-02 DIAGNOSIS — M2550 Pain in unspecified joint: Secondary | ICD-10-CM | POA: Diagnosis not present

## 2017-01-02 DIAGNOSIS — E119 Type 2 diabetes mellitus without complications: Secondary | ICD-10-CM | POA: Diagnosis not present

## 2017-01-02 LAB — BAYER DCA HB A1C WAIVED: HB A1C (BAYER DCA - WAIVED): 7.3 % — ABNORMAL HIGH (ref <7.0)

## 2017-01-02 MED ORDER — SILDENAFIL CITRATE 100 MG PO TABS: 50.0000 mg | ORAL_TABLET | Freq: Every day | ORAL | 11 refills | Status: DC | PRN

## 2017-01-02 NOTE — Progress Notes (Signed)
   HPI  Patient presents today for follow-up chronic medical conditions   T2DM Medication compliance is good After chesting is 100-120.  Joint pains. Patient states that he has intermittent joint pains that last for 24-48 hours. He has elbow set times, ankles at times. No swelling or warmth of the joint space times.  No injury.  ED Difficulty with erections for months.  Would like to try medications.   PMH: Smoking status noted ROS: Per HPI  Objective: BP 123/73   Pulse 73   Temp 97.1 F (36.2 C) (Oral)   Ht '6\' 2"'$  (1.88 m)  Gen: NAD, alert, cooperative with exam HEENT: NCAT CV: RRR, good S1/S2, no murmur Resp: CTABL, no wheezes, non-labored Ext: compression stockings in place.  Neuro: Alert and oriented, No gross deficits  Assessment and plan:  # T2DM Stable, not ideal but no need for med changes Patient effort is good.   # ED New problem, trial of viagra  # Arthralgias Moderate, intermittent Check uric acid Short course of NSAIDs  (2-4 days) ok    Orders Placed This Encounter  Procedures  . Bayer DCA Hb A1c Waived  . Uric Acid  . CMP14+EGFR    Meds ordered this encounter  Medications  . sildenafil (VIAGRA) 100 MG tablet    Sig: Take 0.5-1 tablets (50-100 mg total) by mouth daily as needed for erectile dysfunction.    Dispense:  5 tablet    Refill:  [place], MD [place] 01/02/2017, 9:01 AM

## 2017-01-02 NOTE — Patient Instructions (Signed)
Great to see you! Try viagra 1/2 to 1 pill 30 minutes before you need it.

## 2017-01-03 LAB — CMP14+EGFR
A/G RATIO: 1 — AB (ref 1.2–2.2)
ALK PHOS: 124 IU/L — AB (ref 39–117)
ALT: 17 IU/L (ref 0–44)
AST: 15 IU/L (ref 0–40)
Albumin: 3.9 g/dL (ref 3.5–5.5)
BILIRUBIN TOTAL: 0.5 mg/dL (ref 0.0–1.2)
BUN / CREAT RATIO: 20 (ref 9–20)
BUN: 30 mg/dL — AB (ref 6–24)
CHLORIDE: 99 mmol/L (ref 96–106)
CO2: 27 mmol/L (ref 18–29)
Calcium: 9.1 mg/dL (ref 8.7–10.2)
Creatinine, Ser: 1.52 mg/dL — ABNORMAL HIGH (ref 0.76–1.27)
GFR calc Af Amer: 58 mL/min/{1.73_m2} — ABNORMAL LOW (ref >59)
GFR calc non Af Amer: 50 mL/min/{1.73_m2} — ABNORMAL LOW (ref >59)
GLUCOSE: 95 mg/dL (ref 65–99)
Globulin, Total: 3.8 g/dL (ref 1.5–4.5)
POTASSIUM: 4.4 mmol/L (ref 3.5–5.2)
Sodium: 140 mmol/L (ref 134–144)
Total Protein: 7.7 g/dL (ref 6.0–8.5)

## 2017-01-03 LAB — URIC ACID: URIC ACID: 11.5 mg/dL — AB (ref 3.7–8.6)

## 2017-01-19 ENCOUNTER — Other Ambulatory Visit: Payer: Self-pay | Admitting: Family Medicine

## 2017-03-07 ENCOUNTER — Other Ambulatory Visit: Payer: Self-pay | Admitting: Family Medicine

## 2017-03-31 ENCOUNTER — Other Ambulatory Visit: Payer: Self-pay | Admitting: Family Medicine

## 2017-04-06 ENCOUNTER — Other Ambulatory Visit: Payer: Self-pay | Admitting: Family Medicine

## 2017-04-07 ENCOUNTER — Ambulatory Visit: Payer: BLUE CROSS/BLUE SHIELD | Admitting: Family Medicine

## 2017-04-08 ENCOUNTER — Ambulatory Visit (INDEPENDENT_AMBULATORY_CARE_PROVIDER_SITE_OTHER): Payer: BLUE CROSS/BLUE SHIELD | Admitting: Family Medicine

## 2017-04-08 ENCOUNTER — Encounter: Payer: Self-pay | Admitting: Family Medicine

## 2017-04-08 VITALS — BP 158/90 | HR 78 | Temp 97.6°F | Ht 74.0 in | Wt 388.0 lb

## 2017-04-08 DIAGNOSIS — I10 Essential (primary) hypertension: Secondary | ICD-10-CM

## 2017-04-08 DIAGNOSIS — E119 Type 2 diabetes mellitus without complications: Secondary | ICD-10-CM | POA: Diagnosis not present

## 2017-04-08 DIAGNOSIS — M1732 Unilateral post-traumatic osteoarthritis, left knee: Secondary | ICD-10-CM

## 2017-04-08 DIAGNOSIS — M179 Osteoarthritis of knee, unspecified: Secondary | ICD-10-CM | POA: Insufficient documentation

## 2017-04-08 DIAGNOSIS — M1710 Unilateral primary osteoarthritis, unspecified knee: Secondary | ICD-10-CM | POA: Insufficient documentation

## 2017-04-08 LAB — BAYER DCA HB A1C WAIVED: HB A1C: 7.6 % — AB (ref <7.0)

## 2017-04-08 MED ORDER — NAPROXEN 500 MG PO TABS: 500.0000 mg | ORAL_TABLET | Freq: Two times a day (BID) | ORAL | 2 refills | Status: DC

## 2017-04-08 MED ORDER — AMLODIPINE-OLMESARTAN 10-40 MG PO TABS: 1.0000 | ORAL_TABLET | Freq: Every day | ORAL | 3 refills | Status: DC

## 2017-04-08 MED ORDER — CANAGLIFLOZIN 300 MG PO TABS: 300.0000 mg | ORAL_TABLET | Freq: Every day | ORAL | 3 refills | Status: DC

## 2017-04-08 MED ORDER — CLONIDINE HCL 0.1 MG PO TABS: 0.1000 mg | ORAL_TABLET | Freq: Two times a day (BID) | ORAL | 3 refills | Status: DC

## 2017-04-08 MED ORDER — CARVEDILOL 25 MG PO TABS: 25.0000 mg | ORAL_TABLET | Freq: Two times a day (BID) | ORAL | 3 refills | Status: DC

## 2017-04-08 MED ORDER — ROSUVASTATIN CALCIUM 5 MG PO TABS: 5.0000 mg | ORAL_TABLET | Freq: Every day | ORAL | 3 refills | Status: DC

## 2017-04-08 NOTE — Progress Notes (Signed)
   HPI  Patient presents today for follow-up for diabetes.  Patient feels well and states that his blood sugar; well. Average fasting is in the 90s and low 100s. No hypoglycemia Taking 40 units of soliqua daily Cannot tell if invokana is helping   PMH: Smoking status noted ROS: Per HPI  Objective: BP (!) 158/90   Pulse 78   Temp 97.6 F (36.4 C) (Oral)   Ht '6\' 2"'$  (1.88 m)   Wt (!) 388 lb (176 kg)   BMI 49.82 kg/m  Gen: NAD, alert, cooperative with exam HEENT: NCA CV: RRR, good S1/S2, no murmur Resp: CTABL, no wheezes, non-labored Ext: RLE 1+ PItting edema, Compression socks on BL Neuro: Alert and oriented, No gross deficits  Assessment and plan:  # Type 2 diabetes A1c pending, however I believe he's had some improvement overall. Continue current medications  # Hypertension Borderline control today Continue current medications Right lower extremity edema after previous injury. Amlodipine could be contributing, however he is on multiple medications so for now we will not change this  # Left knee osteoarthritis Change meloxicam to naproxen Discussed chronic kidney disease been trying to avoid NSAIDs, he uses NSAIDs only for short courses as needed. Approximately one course every 2 or 3 months.     Orders Placed This Encounter  Procedures  . CMP14+EGFR  . Lipid panel  . Bayer DCA Hb A1c Waived    Meds ordered this encounter  Medications  . amLODipine-olmesartan (AZOR) 10-40 MG tablet    Sig: Take 1 tablet by mouth daily.    Dispense:  90 tablet    Refill:  3    Please consider 90 day supplies to promote better adherence  . carvedilol (COREG) 25 MG tablet    Sig: Take 1 tablet (25 mg total) by mouth 2 (two) times daily with a meal.    Dispense:  180 tablet    Refill:  3  . cloNIDine (CATAPRES) 0.1 MG tablet    Sig: Take 1 tablet (0.1 mg total) by mouth 2 (two) times daily.    Dispense:  180 tablet    Refill:  3  . canagliflozin (INVOKANA) 300 MG TABS  tablet    Sig: Take 1 tablet (300 mg total) by mouth daily.    Dispense:  90 tablet    Refill:  3    Please consider 90 day supplies to promote better adherence  . rosuvastatin (CRESTOR) 5 MG tablet    Sig: Take 1 tablet (5 mg total) by mouth daily.    Dispense:  90 tablet    Refill:  3  . naproxen (NAPROSYN) 500 MG tablet    Sig: Take 1 tablet (500 mg total) by mouth 2 (two) times daily with a meal.    Dispense:  30 tablet    Refill:  [place], MD [facility] 04/08/2017, 8:34 AM

## 2017-04-08 NOTE — Patient Instructions (Signed)
Great to see you!  Come back in 3 months unless you need us sooner.    

## 2017-04-09 LAB — CMP14+EGFR
ALBUMIN: 3.8 g/dL (ref 3.5–5.5)
ALK PHOS: 112 IU/L (ref 39–117)
ALT: 16 IU/L (ref 0–44)
AST: 18 IU/L (ref 0–40)
Albumin/Globulin Ratio: 1 — ABNORMAL LOW (ref 1.2–2.2)
BUN / CREAT RATIO: 14 (ref 9–20)
BUN: 21 mg/dL (ref 6–24)
Bilirubin Total: 0.4 mg/dL (ref 0.0–1.2)
CALCIUM: 9.4 mg/dL (ref 8.7–10.2)
CO2: 27 mmol/L (ref 20–29)
CREATININE: 1.51 mg/dL — AB (ref 0.76–1.27)
Chloride: 102 mmol/L (ref 96–106)
GFR calc Af Amer: 59 mL/min/{1.73_m2} — ABNORMAL LOW (ref >59)
GFR calc non Af Amer: 51 mL/min/{1.73_m2} — ABNORMAL LOW (ref >59)
GLUCOSE: 84 mg/dL (ref 65–99)
Globulin, Total: 4 g/dL (ref 1.5–4.5)
Potassium: 4.4 mmol/L (ref 3.5–5.2)
Sodium: 144 mmol/L (ref 134–144)
TOTAL PROTEIN: 7.8 g/dL (ref 6.0–8.5)

## 2017-04-09 LAB — LIPID PANEL
CHOLESTEROL TOTAL: 103 mg/dL (ref 100–199)
Chol/HDL Ratio: 3.6 ratio (ref 0.0–5.0)
HDL: 29 mg/dL — ABNORMAL LOW (ref >39)
LDL CALC: 51 mg/dL (ref 0–99)
TRIGLYCERIDES: 115 mg/dL (ref 0–149)
VLDL CHOLESTEROL CAL: 23 mg/dL (ref 5–40)

## 2017-04-28 ENCOUNTER — Other Ambulatory Visit: Payer: Self-pay | Admitting: Family Medicine

## 2017-06-16 ENCOUNTER — Encounter: Payer: Self-pay | Admitting: Family Medicine

## 2017-06-16 ENCOUNTER — Ambulatory Visit (INDEPENDENT_AMBULATORY_CARE_PROVIDER_SITE_OTHER): Payer: BLUE CROSS/BLUE SHIELD | Admitting: Family Medicine

## 2017-06-16 VITALS — BP 133/84 | HR 98 | Temp 98.9°F | Ht 74.0 in | Wt >= 6400 oz

## 2017-06-16 DIAGNOSIS — J189 Pneumonia, unspecified organism: Secondary | ICD-10-CM

## 2017-06-16 MED ORDER — CEFDINIR 300 MG PO CAPS: 300.0000 mg | ORAL_CAPSULE | Freq: Two times a day (BID) | ORAL | 0 refills | Status: DC

## 2017-06-16 NOTE — Progress Notes (Signed)
BP 133/84   Pulse 98   Temp 98.9 F (37.2 C) (Oral)   Ht 6\' 2"  (1.88 m)   Wt (!) 403 lb (182.8 kg)   SpO2 92%   BMI 51.74 kg/m    Subjective:    Patient ID: Jon Moore, male    DOB: November 25, 1960, 56 y.o.   MRN: [id number]  HPI: Jon Moore is a 56 y.o. male presenting on 06/16/2017 for Chest and sinus congestion, cough (x 5 days, has taken Mucinex and Theraflu)   HPI Chest congestion and sinus congestion Patient comes in complaining of cough and chest congestion and wheezing.  He says it started about 5 days ago and he had fevers the first couple days.  He took some Advil and Mucinex and TheraFlu which have seemed to help but he is just not getting over the cough and chest congestion.  His oxygen saturation here in the office today is 92%.  He denies any shortness of breath but definitely feels some wheezing especially at nighttime.  Relevant past medical, surgical, family and social history reviewed and updated as indicated. Interim medical history since our last visit reviewed. Allergies and medications reviewed and updated.  Review of Systems  Constitutional: Positive for fever. Negative for chills.  HENT: Positive for congestion, postnasal drip, rhinorrhea, sinus pressure, sneezing and sore throat. Negative for ear discharge, ear pain and voice change.   Eyes: Negative for pain, discharge, redness and visual disturbance.  Respiratory: Positive for cough and wheezing. Negative for shortness of breath.   Cardiovascular: Negative for chest pain and leg swelling.  Musculoskeletal: Negative for gait problem.  Skin: Negative for rash.  All other systems reviewed and are negative.   Per HPI unless specifically indicated above     Objective:    BP 133/84   Pulse 98   Temp 98.9 F (37.2 C) (Oral)   Ht 6\' 2"  (1.88 m)   Wt (!) 403 lb (182.8 kg)   SpO2 92%   BMI 51.74 kg/m   Wt Readings from Last 3 Encounters:  06/16/17 (!) 403 lb (182.8 kg)  04/08/17 (!) 388 lb (176  kg)  09/18/16 (!) 383 lb 9.6 oz (174 kg)    Physical Exam  Constitutional: He is oriented to person, place, and time. He appears well-developed and well-nourished. No distress.  HENT:  Right Ear: Tympanic membrane, external ear and ear canal normal.  Left Ear: Tympanic membrane, external ear and ear canal normal.  Nose: Mucosal edema and rhinorrhea present. No sinus tenderness. No epistaxis. Right sinus exhibits no maxillary sinus tenderness and no frontal sinus tenderness. Left sinus exhibits no maxillary sinus tenderness and no frontal sinus tenderness.  Mouth/Throat: Uvula is midline and mucous membranes are normal. Posterior oropharyngeal edema and posterior oropharyngeal erythema present. No oropharyngeal exudate or tonsillar abscesses.  Eyes: Conjunctivae are normal. No scleral icterus.  Neck: Neck supple. No thyromegaly present.  Cardiovascular: Normal rate, regular rhythm, normal heart sounds and intact distal pulses.   No murmur heard. Pulmonary/Chest: Effort normal. No respiratory distress. He has no decreased breath sounds. He has no wheezes. He has rhonchi. He has no rales.  Musculoskeletal: Normal range of motion. He exhibits no edema.  Lymphadenopathy:    He has no cervical adenopathy.  Neurological: He is alert and oriented to person, place, and time. Coordination normal.  Skin: Skin is warm and dry. No rash noted. He is not diaphoretic.  Psychiatric: He has a normal mood and affect. His behavior is normal.  Nursing note and vitals reviewed.     Assessment & Plan:   Problem List Items Addressed This Visit    None    Visit Diagnoses    Atypical pneumonia    -  Primary   Relevant Medications   cefdinir (OMNICEF) 300 MG capsule       Follow up plan: Return if symptoms worsen or fail to improve.  Counseling provided for all of the vaccine components No orders of the defined types were placed in this encounter.   Arville Care, MD [facility] 06/16/2017, 8:12 AM

## 2017-07-13 ENCOUNTER — Other Ambulatory Visit: Payer: Self-pay | Admitting: Family Medicine

## 2017-07-14 ENCOUNTER — Other Ambulatory Visit: Payer: Self-pay | Admitting: Family Medicine

## 2017-07-15 ENCOUNTER — Encounter: Payer: Self-pay | Admitting: Family Medicine

## 2017-07-15 ENCOUNTER — Ambulatory Visit: Payer: BLUE CROSS/BLUE SHIELD | Admitting: Family Medicine

## 2017-07-15 VITALS — BP 136/79 | HR 70 | Temp 97.0°F | Ht 74.0 in | Wt 397.2 lb

## 2017-07-15 DIAGNOSIS — E119 Type 2 diabetes mellitus without complications: Secondary | ICD-10-CM | POA: Diagnosis not present

## 2017-07-15 DIAGNOSIS — Z23 Encounter for immunization: Secondary | ICD-10-CM

## 2017-07-15 DIAGNOSIS — M25521 Pain in right elbow: Secondary | ICD-10-CM | POA: Diagnosis not present

## 2017-07-15 LAB — BAYER DCA HB A1C WAIVED: HB A1C (BAYER DCA - WAIVED): 7.9 % — ABNORMAL HIGH (ref <7.0)

## 2017-07-15 MED ORDER — COLCHICINE 0.6 MG PO TABS: 0.6000 mg | ORAL_TABLET | Freq: Every day | ORAL | 2 refills | Status: DC

## 2017-07-15 NOTE — Patient Instructions (Addendum)
Great to see you!  For your elbow-I have recommended colchicine to treat for gout, this will also work for other gout flares.  Take 2 pills once followed by 1 pill 1 hour later.  You can then take 1 pill once daily for 5 more days which should treat a flare pretty well.   Come back in 3 months to follow-up for diabetes

## 2017-07-15 NOTE — Progress Notes (Signed)
   HPI  Patient presents today follow-up for diabetes and elbow pain.  Diabetes Good medication compliance, no hypoglycemia. Average fasting blood sugar is 90-110 Average postprandials 170-200 He is watching his diet.  Right elbow pain Patient has been struggling with this pain for about 6 months, we saw him for it in May of this year.  His uric acid was found to be 11.2 at that time.  PMH: Smoking status noted ROS: Per HPI  Objective: BP 136/79   Pulse 70   Temp (!) 97 F (36.1 C) (Oral)   Ht 6\' 2"  (1.88 m)   Wt (!) 397 lb 3.2 oz (180.2 kg)   BMI 51.00 kg/m  Gen: NAD, alert, cooperative with exam HEENT: NCAT, EOMI, PERRL CV: RRR, good S1/S2, no murmur Resp: CTABL, no wheezes, non-labored Ext: No edema, warm Neuro: Alert and oriented, No gross deficits  Diabetic Foot Exam - Simple   Simple Foot Form Diabetic Foot exam was performed with the following findings:  Yes 07/15/2017  8:13 AM  Visual Inspection No deformities, no ulcerations, no other skin breakdown bilaterally:  Yes Sensation Testing Intact to touch and monofilament testing bilaterally:  Yes Pulse Check Posterior Tibialis and Dorsalis pulse intact bilaterally:  Yes Comments      Assessment and plan:  #Type 2 diabetes Expect good control. Continue current medications No hypoglycemia Follow-up 3 months  #Right elbow pain Gout versus tennis elbow, patient with very elevated uric acid so I will proceed with colchicine first. He does have symptoms very consistent with tennis elbow we discussed supportive care for this.  If not improved with colchicine will refer to  sports medicine to consider injection  Counseling provided for all vaccines and vaccine components   Orders Placed This Encounter  Procedures  . Bayer DCA Hb A1c Waived    Meds ordered this encounter  Medications  . colchicine 0.6 MG tablet    Sig: Take 1 tablet (0.6 mg total) by mouth daily. 2 tabs once then 1 tab 1 hour later  for flare followed by 1 tab daily for 5 more days    Dispense:  24 tablet    Refill:  2    Murtis SinkSam Janki Dike, MD [facility] 07/15/2017, 8:17 AM

## 2017-08-16 LAB — HM DIABETES EYE EXAM

## 2017-10-13 ENCOUNTER — Other Ambulatory Visit: Payer: Self-pay | Admitting: Family Medicine

## 2017-10-20 ENCOUNTER — Encounter: Payer: Self-pay | Admitting: Family Medicine

## 2017-10-20 ENCOUNTER — Ambulatory Visit: Payer: BLUE CROSS/BLUE SHIELD | Admitting: Family Medicine

## 2017-10-20 VITALS — BP 142/84 | HR 75 | Temp 97.0°F | Ht 74.0 in | Wt 398.6 lb

## 2017-10-20 DIAGNOSIS — E119 Type 2 diabetes mellitus without complications: Secondary | ICD-10-CM

## 2017-10-20 DIAGNOSIS — I10 Essential (primary) hypertension: Secondary | ICD-10-CM | POA: Diagnosis not present

## 2017-10-20 DIAGNOSIS — M7989 Other specified soft tissue disorders: Secondary | ICD-10-CM

## 2017-10-20 LAB — BAYER DCA HB A1C WAIVED: HB A1C (BAYER DCA - WAIVED): 7.8 % — ABNORMAL HIGH (ref <7.0)

## 2017-10-20 MED ORDER — FUROSEMIDE 80 MG PO TABS: 80.0000 mg | ORAL_TABLET | Freq: Two times a day (BID) | ORAL | 3 refills | Status: DC

## 2017-10-20 NOTE — Progress Notes (Signed)
   HPI  Patient presents today for follow-up chronic medical conditions.  Hypertension Good medication compliance with no side effects.  Diabetes Patient reports average fasting blood sugar ranging 90-120. Good medication compliance.  Swelling Patient with severe venous stasis, wears compression stockings daily, has had weeping legs previously with forgetting compression stockings for only a few hours. Also uses Lasix daily  PMH: Smoking status noted ROS: Per HPI  Objective: BP (!) 142/84   Pulse 75   Temp (!) 97 F (36.1 C) (Oral)   Ht '6\' 2"'$  (1.88 m)   Wt (!) 398 lb 9.6 oz (180.8 kg)   BMI 51.18 kg/m  Gen: NAD, alert, cooperative with exam HEENT: NCAT CV: RRR, good S1/S2, no murmur Resp: CTABL, no wheezes, non-labored Abd: SNTND, BS present, no guarding or organomegaly Ext: compression stocking in place, no pitting Neuro: Alert and oriented, No gross deficits  Assessment and plan:  # T2DM Previously well controlled, expect good control No changes BMP with metformin plus mild CKD 3*   #Hypertension reasonably controlled No changes  #Leg swelling Lasix plus compression stockings, Doing well    Orders Placed This Encounter  Procedures  . Microalbumin / creatinine urine ratio  . Bayer DCA Hb A1c Waived  . BMP8+EGFR    Meds ordered this encounter  Medications  . furosemide (LASIX) 80 MG tablet    Sig: Take 1 tablet (80 mg total) by mouth 2 (two) times daily.    Dispense:  180 tablet    Refill:  [place], MD [place] 10/20/2017, 8:11 AM

## 2017-10-21 LAB — MICROALBUMIN / CREATININE URINE RATIO
Creatinine, Urine: 138.4 mg/dL
MICROALB/CREAT RATIO: 36.2 mg/g{creat} — AB (ref 0.0–30.0)
MICROALBUM., U, RANDOM: 50.1 ug/mL

## 2017-10-21 LAB — BMP8+EGFR
BUN / CREAT RATIO: 14 (ref 9–20)
BUN: 22 mg/dL (ref 6–24)
CHLORIDE: 102 mmol/L (ref 96–106)
CO2: 28 mmol/L (ref 20–29)
Calcium: 9.5 mg/dL (ref 8.7–10.2)
Creatinine, Ser: 1.52 mg/dL — ABNORMAL HIGH (ref 0.76–1.27)
GFR calc non Af Amer: 50 mL/min/{1.73_m2} — ABNORMAL LOW (ref >59)
GFR, EST AFRICAN AMERICAN: 58 mL/min/{1.73_m2} — AB (ref >59)
Glucose: 104 mg/dL — ABNORMAL HIGH (ref 65–99)
Potassium: 4.3 mmol/L (ref 3.5–5.2)
SODIUM: 145 mmol/L — AB (ref 134–144)

## 2017-11-13 ENCOUNTER — Other Ambulatory Visit: Payer: Self-pay | Admitting: Family Medicine

## 2017-11-24 ENCOUNTER — Other Ambulatory Visit: Payer: Self-pay | Admitting: Family Medicine

## 2018-01-14 ENCOUNTER — Other Ambulatory Visit: Payer: Self-pay | Admitting: Family Medicine

## 2018-01-20 ENCOUNTER — Encounter: Payer: Self-pay | Admitting: Family Medicine

## 2018-01-20 ENCOUNTER — Ambulatory Visit: Payer: BLUE CROSS/BLUE SHIELD | Admitting: Family Medicine

## 2018-01-20 VITALS — BP 133/73 | HR 80 | Temp 97.5°F | Ht 74.0 in | Wt 385.8 lb

## 2018-01-20 DIAGNOSIS — I10 Essential (primary) hypertension: Secondary | ICD-10-CM

## 2018-01-20 DIAGNOSIS — M25522 Pain in left elbow: Secondary | ICD-10-CM

## 2018-01-20 DIAGNOSIS — L84 Corns and callosities: Secondary | ICD-10-CM | POA: Diagnosis not present

## 2018-01-20 DIAGNOSIS — E119 Type 2 diabetes mellitus without complications: Secondary | ICD-10-CM | POA: Diagnosis not present

## 2018-01-20 LAB — BAYER DCA HB A1C WAIVED: HB A1C: 7.2 % — AB (ref <7.0)

## 2018-01-20 MED ORDER — INSULIN GLARGINE-LIXISENATIDE 100-33 UNT-MCG/ML ~~LOC~~ SOPN: 30.0000 [IU] | PEN_INJECTOR | Freq: Every day | SUBCUTANEOUS | 11 refills | Status: DC

## 2018-01-20 NOTE — Progress Notes (Signed)
   HPI  Patient presents today here for follow up chronic medical problems.   L elbow gout flaref or 3 days, on NSAIDs, improving slightly Previously did not do well with colchicine- no effect  T2DM 44-48 units soliqua  no hypoglycemia  Blister/callus forming on L heel for about 1 week.    HTN Good med compliance.  No CP or HA  PMH: Smoking status noted ROS: Per HPI  Objective: BP 133/73   Pulse 80   Temp (!) 97.5 F (36.4 C) (Oral)   Ht 6\' 2"  (1.88 m)   Wt (!) 385 lb 12.8 oz (175 kg)   BMI 49.53 kg/m  Gen: NAD, alert, cooperative with exam HEENT: NCAT CV: RRR, good S1/S2, no murmur Resp: CTABL, no wheezes, non-labored Ext: No edema, warm Neuro: Alert and oriented, No gross deficits  Diabetic Foot Exam - Simple   Simple Foot Form Diabetic Foot exam was performed with the following findings:  Yes 01/20/2018 10:11 AM  Visual Inspection See comments:  Yes Sensation Testing See comments:  Yes Pulse Check See comments:  Yes Comments Previous intact sensation BL L heel with pre-ulcerative callus forming      Assessment and plan:  # HTN Well controlled No Chnages  # T2Dm Bprderline control recently, no changes, A1C pending  # pre-ulcerative callus Discussed good foot care, he is very attentive and I belive is doing all he can.  Diabetic shoes Rx given CLose monitoring, low threshold for follow up  L elbow pain Likely gout  Active L elbow pain, continue NSAIDs Discussed colchicine, not sent due to ineffectiveness las episode about 6 months ago.    Orders Placed This Encounter  Procedures  . Bayer DCA Hb A1c Waived    Meds ordered this encounter  Medications  . Insulin Glargine-Lixisenatide (SOLIQUA) 100-33 UNT-MCG/ML SOPN    Sig: Inject 30-60 Units into the skin daily.    Dispense:  18 mL    Refill:  11    Please consider 90 day supplies to promote better adherence    Murtis SinkSam Denelda Akerley, MD [facility] 01/20/2018, 10:22  AM

## 2018-01-22 ENCOUNTER — Telehealth: Payer: Self-pay | Admitting: Family Medicine

## 2018-01-22 MED ORDER — PREDNISONE 20 MG PO TABS: 40.0000 mg | ORAL_TABLET | Freq: Every day | ORAL | 0 refills | Status: DC

## 2018-01-22 NOTE — Telephone Encounter (Signed)
Patient aware and verbalizes understanding. 

## 2018-01-22 NOTE — Telephone Encounter (Signed)
Patient stopped by the office today and told nursing that he is still having left elbow pain which is severe, he feels this is likely due to gout per our discussion a couple of days ago.  We will try a short course of prednisone, will advise very close monitoring of CBGs during this time.  He is already on meloxicam He is not done well with colchicine previously  Murtis SinkSam Bradshaw, MD [facility] 01/22/2018, 4:52 PM

## 2018-02-26 ENCOUNTER — Telehealth: Payer: Self-pay | Admitting: Family Medicine

## 2018-02-26 MED ORDER — INSULIN GLARGINE-LIXISENATIDE 100-33 UNT-MCG/ML ~~LOC~~ SOPN: 30.0000 [IU] | PEN_INJECTOR | Freq: Every day | SUBCUTANEOUS | 11 refills | Status: DC

## 2018-02-26 NOTE — Telephone Encounter (Signed)
What is the name of the medication? Insulin Glargine-Lixisenatide (SOLIQUA) 100-33 UNT-MCG/ML SOPN  Have you contacted your pharmacy to request a refill? Yes  Which pharmacy would you like this sent to? [facility]   Patient notified that their request is being sent to the clinical staff for review and that they should receive a call once it is complete. If they do not receive a call within 24 hours they can check with their pharmacy or our office.

## 2018-02-26 NOTE — Telephone Encounter (Signed)
Rx sent- patient aware.  

## 2018-04-03 ENCOUNTER — Ambulatory Visit (INDEPENDENT_AMBULATORY_CARE_PROVIDER_SITE_OTHER): Payer: BLUE CROSS/BLUE SHIELD

## 2018-04-03 ENCOUNTER — Ambulatory Visit: Payer: BLUE CROSS/BLUE SHIELD | Admitting: Family Medicine

## 2018-04-03 ENCOUNTER — Encounter: Payer: Self-pay | Admitting: Family Medicine

## 2018-04-03 VITALS — BP 140/81 | HR 83 | Temp 98.4°F | Ht 74.0 in | Wt 385.0 lb

## 2018-04-03 DIAGNOSIS — J0100 Acute maxillary sinusitis, unspecified: Secondary | ICD-10-CM | POA: Diagnosis not present

## 2018-04-03 DIAGNOSIS — M25571 Pain in right ankle and joints of right foot: Secondary | ICD-10-CM | POA: Diagnosis not present

## 2018-04-03 MED ORDER — AMOXICILLIN-POT CLAVULANATE 875-125 MG PO TABS: 1.0000 | ORAL_TABLET | Freq: Two times a day (BID) | ORAL | 0 refills | Status: DC

## 2018-04-03 NOTE — Progress Notes (Signed)
Subjective: CC: Ankle pain/swelling PCP: Elenora GammaBradshaw, Samuel L, MD ZOX:WRUEAVWHPI:Jon Moore is a 57 y.o. male presenting to clinic today for:  1.  Ankle pain Patient reports that he has had right sided ankle pain with associated swelling since Sunday.  He notes that he had stepped wrong on Saturday.  Denies any discoloration, numbness tingling.  He has been using ibuprofen alternating with meloxicam, never both in the same day.  This does seem to help.  Elevating the lower extremity also seems to help.  He does have pain with ambulation and cites that he started using crutches on Wednesday because the pain became so severe.  2.  Sinus symptoms Patient reports sinus drainage, scratchy throat and intermittent cough.  He had fever to 100.5 F on Sunday and then again on Wednesday.  He notes that sinus symptoms have been ongoing for several weeks now.  Denies any shortness of breath, hemoptysis.  He has not used anything over-the-counter for his cold symptoms.  He has Nasonex but uses it intermittently.   ROS: Per HPI  No Known Allergies Past Medical History:  Diagnosis Date  . Diabetes mellitus   . Frozen shoulder syndrome 2013   Right shoulder.  Has had PT.  Marland Kitchen. Hypercholesterolemia   . Hypertension     Current Outpatient Medications:  .  amLODipine-olmesartan (AZOR) 10-40 MG tablet, Take 1 tablet by mouth daily., Disp: 90 tablet, Rfl: 3 .  canagliflozin (INVOKANA) 300 MG TABS tablet, Take 1 tablet (300 mg total) by mouth daily., Disp: 90 tablet, Rfl: 3 .  carvedilol (COREG) 25 MG tablet, Take 1 tablet (25 mg total) by mouth 2 (two) times daily with a meal., Disp: 180 tablet, Rfl: 3 .  cloNIDine (CATAPRES) 0.1 MG tablet, Take 1 tablet (0.1 mg total) by mouth 2 (two) times daily., Disp: 180 tablet, Rfl: 3 .  furosemide (LASIX) 80 MG tablet, Take 1 tablet (80 mg total) by mouth 2 (two) times daily., Disp: 180 tablet, Rfl: 3 .  Insulin Glargine-Lixisenatide (SOLIQUA) 100-33 UNT-MCG/ML SOPN, Inject  30-60 Units into the skin daily., Disp: 18 mL, Rfl: 11 .  Insulin Pen Needle 29G X 12.7MM MISC, Use to inject Lantus once qd, Disp: 100 each, Rfl: 1 .  meloxicam (MOBIC) 15 MG tablet, TAKE 1 TABLET BY MOUTH ONCE DAILY, Disp: 90 tablet, Rfl: 0 .  metFORMIN (GLUCOPHAGE) 1000 MG tablet, TAKE 1 TABLET BY MOUTH TWICE DAILY WITH MEALS, Disp: 180 tablet, Rfl: 0 .  mometasone (NASONEX) 50 MCG/ACT nasal spray, Place 2 sprays into the nose daily., Disp: 17 g, Rfl: 12 .  predniSONE (DELTASONE) 20 MG tablet, Take 2 tablets (40 mg total) by mouth daily with breakfast., Disp: 10 tablet, Rfl: 0 .  rosuvastatin (CRESTOR) 5 MG tablet, Take 1 tablet (5 mg total) by mouth daily., Disp: 90 tablet, Rfl: 3 Social History   Socioeconomic History  . Marital status: Married    Spouse name: Not on file  . Number of children: 1  . Years of education: Not on file  . Highest education level: Not on file  Occupational History  . Occupation: [employer]    Comment: Solicitorplant manager   Social Needs  . Financial resource strain: Not on file  . Food insecurity:    Worry: Not on file    Inability: Not on file  . Transportation needs:    Medical: Not on file    Non-medical: Not on file  Tobacco Use  . Smoking status: Never Smoker  .  Smokeless tobacco: Never Used  Substance and Sexual Activity  . Alcohol use: No  . Drug use: No  . Sexual activity: Not on file  Lifestyle  . Physical activity:    Days per week: Not on file    Minutes per session: Not on file  . Stress: Not on file  Relationships  . Social connections:    Talks on phone: Not on file    Gets together: Not on file    Attends religious service: Not on file    Active member of club or organization: Not on file    Attends meetings of clubs or organizations: Not on file    Relationship status: Not on file  . Intimate partner violence:    Fear of current or ex partner: Not on file    Emotionally abused: Not on file    Physically abused:  Not on file    Forced sexual activity: Not on file  Other Topics Concern  . Not on file  Social History Narrative  . Not on file   Family History  Problem Relation Age of Onset  . Diabetes Mother   . Heart failure Mother   . Hypertension Father   . Kidney disease Father   . Cancer Sister   . Hodgkin's lymphoma Sister   . Hypertension Brother   . Hypertension Brother   . Hypertension Brother   . Colon cancer Neg Hx     Objective: Office vital signs reviewed. BP 140/81   Pulse 83   Temp 98.4 F (36.9 C) (Oral)   Ht 6\' 2"  (1.88 m)   Wt (!) 385 lb (174.6 kg)   BMI 49.43 kg/m   Physical Examination:  General: Awake, alert, obese, nontoxic, No acute distress HEENT: Normal    Neck: No masses palpated. No lymphadenopathy    Ears: Tympanic membranes intact, normal light reflex, no erythema, no bulging    Eyes: PERRLA, extraocular membranes intact, sclera white    Nose: nasal turbinates moist, clear nasal discharge    Throat: moist mucus membranes, no erythema, no tonsillar exudate.  Airway is patent Cardio: regular rate and rhythm, S1S2 heard, no murmurs appreciated Pulm: clear to auscultation bilaterally, no wheezes, rhonchi or rales; normal work of breathing on room air Extremities: warm, well perfused, No edema, cyanosis or clubbing; +2 pulses bilaterally MSK: antalgic gait and station  Right ankle: Patient with nonpitting edema of right ankle.  He has edema bilaterally.  He has tenderness to palpation along the distal medial malleolus.  He also has tenderness to palpation along the medial dorsum of the midfoot.  No palpable bony abnormalities.  No effusion or discoloration appreciated.  He has full active range of motion of foot and ankle. Skin: dry; intact; no rashes or lesions Neuro: light touch sensation in tact.  Dg Ankle Complete Right  Result Date: 04/03/2018 CLINICAL DATA:  Right ankle pain due to a twisting injury today. Initial encounter. EXAM: RIGHT ANKLE -  COMPLETE 3+ VIEW COMPARISON:  None. FINDINGS: There is no acute bony or joint abnormality. Moderate midfoot osteoarthritis is seen. Plantar calcaneal spurring is noted. Extensive subcutaneous calcifications are present about the lower leg. IMPRESSION: No acute abnormality. Subcutaneous calcifications of the lower leg are likely due to prior infectious or inflammatory process. Moderate midfoot osteoarthritis. Electronically Signed   By: Drusilla Kanner M.D.   On: 04/03/2018 14:37    Assessment/ Plan: 57 y.o. male   1. Acute right ankle pain Patient with tenderness to palpation  along the distal medial malleolus.  X-ray was obtained which upon personal review demonstrated no acute fractures.  Radiologist review also demonstrated no acute fractures.  He has subcutaneous calcifications throughout the lower leg.  He also has moderate midfoot arthritis.  Okay to continue oral NSAID if he finds this helpful.  Recommended rice.  I offered him a brace but patient declined.  Reasons for return and reevaluation in the emergency department discussed with patient.  He will follow-up as needed. - DG Ankle Complete Right; Future  2. Acute non-recurrent maxillary sinusitis Given duration of symptoms and recent fevers, will empirically treat for acute bacterial sinusitis with Augmentin p.o. twice daily for the next 10 days.  Resume use of Nasonex.  Reasons for return discussed with the patient.  Follow-up PRN. - amoxicillin-clavulanate (AUGMENTIN) 875-125 MG tablet; Take 1 tablet by mouth 2 (two) times daily.  Dispense: 20 tablet; Refill: 0  3. Morbid obesity (HCC) - DG Ankle Complete Right; Future   Orders Placed This Encounter  Procedures  . DG Ankle Complete Right    Standing Status:   Future    Number of Occurrences:   1    Standing Expiration Date:   06/04/2019    Order Specific Question:   Reason for Exam (SYMPTOM  OR DIAGNOSIS REQUIRED)    Answer:   stepped wrong on Saturday. Pain w/ ambulation.  Tenderness to distal medial maleolus and medial midfoot.    Order Specific Question:   Preferred imaging location?    Answer:   Internal    Order Specific Question:   Radiology Contrast Protocol - do NOT remove file path    Answer:   [email or web address]   Meds ordered this encounter  Medications  . amoxicillin-clavulanate (AUGMENTIN) 875-125 MG tablet    Sig: Take 1 tablet by mouth 2 (two) times daily.    Dispense:  20 tablet    Refill:  0     Jon Beeck Hulen SkainsM Darcella Shiffman, DO [facility] [phone]

## 2018-04-03 NOTE — Patient Instructions (Addendum)
Okay to continue either the meloxicam or the Advil.  Continue elevating your foot.  Use ice to the affected area.  You had an x-ray performed today.  This showed no evidence of fracture.  Performed the therapies at home that we discussed.  Follow-up as needed.  For your sinuses, I have sent in Augmentin for you to take twice a day for the next 10 days.  Use her Nasonex daily.  If symptoms persist, I would like you to seek reevaluation.   Ankle Sprain An ankle sprain is a stretch or tear in one of the tough tissues (ligaments) in your ankle. Follow these instructions at home:  Rest your ankle.  Take over-the-counter and prescription medicines only as told by your doctor.  For 2-3 days, keep your ankle higher than the level of your heart (elevated) as much as possible.  If directed, put ice on the area: ? Put ice in a plastic bag. ? Place a towel between your skin and the bag. ? Leave the ice on for 20 minutes, 2-3 times a day.  If you were given a brace: ? Wear it as told. ? Take it off to shower or bathe. ? Try not to move your ankle much, but wiggle your toes from time to time. This helps to prevent swelling.  If you were given an elastic bandage (dressing): ? Take it off when you shower or bathe. ? Try not to move your ankle much, but wiggle your toes from time to time. This helps to prevent swelling. ? Adjust the bandage to make it more comfortable if it feels too tight. ? Loosen the bandage if you lose feeling in your foot, your foot tingles, or your foot gets cold and blue.  If you have crutches, use them as told by your doctor. Continue to use them until you can walk without feeling pain in your ankle. Contact a doctor if:  Your bruises or swelling are quickly getting worse.  Your pain does not get better after you take medicine. Get help right away if:  You cannot feel your toes or foot.  Your toes or your foot looks blue.  You have very bad pain that gets  worse. This information is not intended to replace advice given to you by your health care provider. Make sure you discuss any questions you have with your health care provider. Document Released: 01/22/2008 Document Revised: 01/11/2016 Document Reviewed: 03/07/2015 Elsevier Interactive Patient Education  [employer]2018 [employer] Inc[employer]

## 2018-04-07 ENCOUNTER — Other Ambulatory Visit: Payer: Self-pay | Admitting: *Deleted

## 2018-04-07 MED ORDER — CANAGLIFLOZIN 300 MG PO TABS: 300.0000 mg | ORAL_TABLET | Freq: Every day | ORAL | 0 refills | Status: DC

## 2018-04-07 MED ORDER — MOMETASONE FUROATE 50 MCG/ACT NA SUSP: 2.0000 | Freq: Every day | NASAL | 1 refills | Status: DC

## 2018-04-17 ENCOUNTER — Other Ambulatory Visit: Payer: Self-pay | Admitting: *Deleted

## 2018-04-17 MED ORDER — METFORMIN HCL 1000 MG PO TABS: 1000.0000 mg | ORAL_TABLET | Freq: Two times a day (BID) | ORAL | 0 refills | Status: DC

## 2018-04-17 MED ORDER — ROSUVASTATIN CALCIUM 5 MG PO TABS: 5.0000 mg | ORAL_TABLET | Freq: Every day | ORAL | 0 refills | Status: DC

## 2018-04-17 NOTE — Telephone Encounter (Signed)
OV 04/29/18 

## 2018-04-27 ENCOUNTER — Ambulatory Visit: Payer: BLUE CROSS/BLUE SHIELD | Admitting: Physician Assistant

## 2018-04-29 ENCOUNTER — Ambulatory Visit: Payer: BLUE CROSS/BLUE SHIELD | Admitting: Family Medicine

## 2018-04-29 ENCOUNTER — Encounter: Payer: Self-pay | Admitting: Family Medicine

## 2018-04-29 VITALS — BP 115/60 | HR 75 | Temp 97.9°F | Ht 74.0 in | Wt 389.0 lb

## 2018-04-29 DIAGNOSIS — E785 Hyperlipidemia, unspecified: Secondary | ICD-10-CM

## 2018-04-29 DIAGNOSIS — Z114 Encounter for screening for human immunodeficiency virus [HIV]: Secondary | ICD-10-CM

## 2018-04-29 DIAGNOSIS — Z23 Encounter for immunization: Secondary | ICD-10-CM

## 2018-04-29 DIAGNOSIS — E1169 Type 2 diabetes mellitus with other specified complication: Secondary | ICD-10-CM

## 2018-04-29 DIAGNOSIS — Z125 Encounter for screening for malignant neoplasm of prostate: Secondary | ICD-10-CM

## 2018-04-29 DIAGNOSIS — I10 Essential (primary) hypertension: Secondary | ICD-10-CM | POA: Diagnosis not present

## 2018-04-29 DIAGNOSIS — Z130 Encounter for screening for diseases of the blood and blood-forming organs and certain disorders involving the immune mechanism: Secondary | ICD-10-CM

## 2018-04-29 DIAGNOSIS — I152 Hypertension secondary to endocrine disorders: Secondary | ICD-10-CM

## 2018-04-29 DIAGNOSIS — E119 Type 2 diabetes mellitus without complications: Secondary | ICD-10-CM | POA: Diagnosis not present

## 2018-04-29 DIAGNOSIS — E1159 Type 2 diabetes mellitus with other circulatory complications: Secondary | ICD-10-CM

## 2018-04-29 DIAGNOSIS — Z8631 Personal history of diabetic foot ulcer: Secondary | ICD-10-CM

## 2018-04-29 LAB — BAYER DCA HB A1C WAIVED: HB A1C (BAYER DCA - WAIVED): 8.1 % — ABNORMAL HIGH (ref <7.0)

## 2018-04-29 NOTE — Addendum Note (Signed)
Addended byDory Peru on: 04/29/2018 12:34 PM   Modules accepted: Orders

## 2018-04-29 NOTE — Progress Notes (Signed)
Subjective: CC: DM2 PCP: Timmothy Euler, MD OLM:[id number] Jon Moore is a 57 y.o. male presenting to clinic today for:  1. Type 2 Diabetes w/ morbid obesity Patient reports: Glucometer: One Touch ultra, High at home: 115; Low at home: 100, Taking medication(s): Metformin 1000 p.o. twice daily, Invokana 300 mg daily, Soliqua 45 units daily, Side effects: None.  Patient was sick last month and treated with prednisone in June.  He also ran out of his insulin for about a week because the pharmacy did not have it in stock.  Last eye exam: December 2018 Last foot exam: June 2019 Last A1c: June 2019 was 7.2 Nephropathy screen indicated?:  On an ARB Last flu, zoster and/or pneumovax: Needs pneumococcal and influenza  ROS: denies fever, chills, dizziness, LOC, polyuria, polydipsia, unintended weight loss/gain, current foot ulcerations, numbness or tingling in extremities or chest pain.  He does report history of foot ulcer and left foot when he was first diagnosed with diabetes.  2. HTN He reports compliance with Lasix 80 mg p.o. twice daily, amlodipine/olmesartan combination daily, Coreg 25 mg p.o. twice daily and clonidine 0.1 mg twice daily.  Denies any chest pain, shortness of breath.  He has chronic lower extremity edema right greater than left.  He states that he uses compression hose and watches his salt intake.  3.  Hyperlipidemia Patient reports compliance with Crestor 5 mg daily.  No chest pain, shortness of breath.  Lower extremity edema as above.  4.  Preventative care Patient reports colonoscopy in 2013.  He had polyps but was told that he did not need follow-up for 10 years.  He denies any GI symptoms including GI bleed.  He thinks that he had his prostate level checked last year but is not sure.  He denies any lower urinary symptoms.  ROS: Per HPI  No Known Allergies Past Medical History:  Diagnosis Date  . Diabetes mellitus   . Frozen shoulder syndrome 2013   Right shoulder.   Has had PT.  Marland Kitchen Hypercholesterolemia   . Hypertension     Current Outpatient Medications:  .  amLODipine-olmesartan (AZOR) 10-40 MG tablet, Take 1 tablet by mouth daily., Disp: 90 tablet, Rfl: 3 .  canagliflozin (INVOKANA) 300 MG TABS tablet, Take 1 tablet (300 mg total) by mouth daily., Disp: 90 tablet, Rfl: 0 .  carvedilol (COREG) 25 MG tablet, Take 1 tablet (25 mg total) by mouth 2 (two) times daily with a meal., Disp: 180 tablet, Rfl: 3 .  cloNIDine (CATAPRES) 0.1 MG tablet, Take 1 tablet (0.1 mg total) by mouth 2 (two) times daily., Disp: 180 tablet, Rfl: 3 .  furosemide (LASIX) 80 MG tablet, Take 1 tablet (80 mg total) by mouth 2 (two) times daily., Disp: 180 tablet, Rfl: 3 .  Insulin Glargine-Lixisenatide (SOLIQUA) 100-33 UNT-MCG/ML SOPN, Inject 30-60 Units into the skin daily., Disp: 18 mL, Rfl: 11 .  Insulin Pen Needle 29G X 12.7MM MISC, Use to inject Lantus once qd, Disp: 100 each, Rfl: 1 .  meloxicam (MOBIC) 15 MG tablet, TAKE 1 TABLET BY MOUTH ONCE DAILY, Disp: 90 tablet, Rfl: 0 .  metFORMIN (GLUCOPHAGE) 1000 MG tablet, Take 1 tablet (1,000 mg total) by mouth 2 (two) times daily with a meal., Disp: 180 tablet, Rfl: 0 .  mometasone (NASONEX) 50 MCG/ACT nasal spray, Place 2 sprays into the nose daily., Disp: 51 g, Rfl: 1 .  rosuvastatin (CRESTOR) 5 MG tablet, Take 1 tablet (5 mg total) by mouth daily., Disp: 90 tablet,  Rfl: 0 Social History   Socioeconomic History  . Marital status: Married    Spouse name: Not on file  . Number of children: 1  . Years of education: Not on file  . Highest education level: Not on file  Occupational History  . Occupation: [employer]    Comment: Engineer, building services   Social Needs  . Financial resource strain: Not on file  . Food insecurity:    Worry: Not on file    Inability: Not on file  . Transportation needs:    Medical: Not on file    Non-medical: Not on file  Tobacco Use  . Smoking status: Never Smoker  . Smokeless tobacco:  Never Used  Substance and Sexual Activity  . Alcohol use: No  . Drug use: No  . Sexual activity: Not on file  Lifestyle  . Physical activity:    Days per week: Not on file    Minutes per session: Not on file  . Stress: Not on file  Relationships  . Social connections:    Talks on phone: Not on file    Gets together: Not on file    Attends religious service: Not on file    Active member of club or organization: Not on file    Attends meetings of clubs or organizations: Not on file    Relationship status: Not on file  . Intimate partner violence:    Fear of current or ex partner: Not on file    Emotionally abused: Not on file    Physically abused: Not on file    Forced sexual activity: Not on file  Other Topics Concern  . Not on file  Social History Narrative  . Not on file   Family History  Problem Relation Age of Onset  . Diabetes Mother   . Heart failure Mother   . Hypertension Father   . Kidney disease Father   . Cancer Sister   . Hodgkin's lymphoma Sister   . Hypertension Brother   . Hypertension Brother   . Hypertension Brother   . Colon cancer Neg Hx     Objective: Office vital signs reviewed. BP 115/60   Pulse 75   Temp 97.9 F (36.6 C) (Oral)   Ht 6' 2"  (1.88 m)   Wt (!) 389 lb (176.4 kg)   BMI 49.94 kg/m   Physical Examination:  General: Awake, alert, well nourished, obese. No acute distress HEENT: Normal, sclera white, MMM Cardio: regular rate and rhythm, S1S2 heard, no murmurs appreciated Pulm: clear to auscultation bilaterally, no wheezes, rhonchi or rales; normal work of breathing on room air Extremities: warm, well perfused, 2+pitting edema of bilateral LE to shins.  He has compression hose in place. MSK: slow gait and normal station Skin: venous stasis skin changes w/in the LE  Assessment/ Plan: 57 y.o. male   1. Diabetes mellitus without complication (HCC) Hemoglobin A1c has increased since last check.  I suspect that the elevation is  secondary to multiple factors including recent illness, use of steroids within the last 3 months and running out of insulin for about a week.  Will allow patient to continue current regimen since he has been stable on regimen previously.  We will plan to recheck A1c in 3 months.  If this is persistently elevated, will need to adjust medications.  Will check labs as below.  Patient is fasting. - Bayer DCA Hb A1c Waived - CMP14+EGFR - Lipid Panel  2. Hypertension associated with  diabetes ([place]) Blood pressure under good control on current regimen.  No changes made. - CMP14+EGFR - Lipid Panel  3. Morbid obesity (HCC) - TSH  4. Screening, anemia, deficiency, iron - CBC with Differential  5. Screening for HIV without presence of risk factors - HIV antibody (with reflex)  6. Screening for malignant neoplasm of prostate Currently asymptomatic. - PSA  7. Personal history of diabetic foot ulcer We will plan for diabetic foot exam at next visit.  Plan for twice yearly foot exams given history of diabetic ulcer in the past.  8. Hyperlipidemia associated with type 2 diabetes mellitus ([place]) Continue crestor.  Check Lipid.   Orders Placed This Encounter  Procedures  . Bayer DCA Hb A1c Waived  . CMP14+EGFR  . CBC with Differential  . TSH  . Lipid Panel  . HIV antibody (with reflex)  . PSA    [name], [place] [phone]

## 2018-04-29 NOTE — Patient Instructions (Addendum)
Your hemoglobin A1c was 8.1 today.  This is above your goal.  I would like you to be below 8.  I suspect that your recent illness, use of steroid in the last 3 months and the fact that you are out of insulin for 1 week has contributed to this.  We will leave all of your medications the same for now.  We will plan to follow-up in 3 months.  If you are having persistent elevation, we will plan to titrate your medicines.  You were given your pneumonia shot today.  Make sure that you schedule your diabetic eye exam.  Schedule an appointment for your flu shot.  We will have these available at the end of the month.  Diabetes and Foot Care Diabetes may cause you to have problems because of poor blood supply (circulation) to your feet and legs. This may cause the skin on your feet to become thinner, break easier, and heal more slowly. Your skin may become dry, and the skin may peel and crack. You may also have nerve damage in your legs and feet causing decreased feeling in them. You may not notice minor injuries to your feet that could lead to infections or more serious problems. Taking care of your feet is one of the most important things you can do for yourself. Follow these instructions at home:  Wear shoes at all times, even in the house. Do not go barefoot. Bare feet are easily injured.  Check your feet daily for blisters, cuts, and redness. If you cannot see the bottom of your feet, use a mirror or ask someone for help.  Wash your feet with warm water (do not use hot water) and mild soap. Then pat your feet and the areas between your toes until they are completely dry. Do not soak your feet as this can dry your skin.  Apply a moisturizing lotion or petroleum jelly (that does not contain alcohol and is unscented) to the skin on your feet and to dry, brittle toenails. Do not apply lotion between your toes.  Trim your toenails straight across. Do not dig under them or around the cuticle. File the edges  of your nails with an emery board or nail file.  Do not cut corns or calluses or try to remove them with medicine.  Wear clean socks or stockings every day. Make sure they are not too tight. Do not wear knee-high stockings since they may decrease blood flow to your legs.  Wear shoes that fit properly and have enough cushioning. To break in new shoes, wear them for just a few hours a day. This prevents you from injuring your feet. Always look in your shoes before you put them on to be sure there are no objects inside.  Do not cross your legs. This may decrease the blood flow to your feet.  If you find a minor scrape, cut, or break in the skin on your feet, keep it and the skin around it clean and dry. These areas may be cleansed with mild soap and water. Do not cleanse the area with peroxide, alcohol, or iodine.  When you remove an adhesive bandage, be sure not to damage the skin around it.  If you have a wound, look at it several times a day to make sure it is healing.  Do not use heating pads or hot water bottles. They may burn your skin. If you have lost feeling in your feet or legs, you may not know  it is happening until it is too late.  Make sure your health care provider performs a complete foot exam at least annually or more often if you have foot problems. Report any cuts, sores, or bruises to your health care provider immediately. Contact a health care provider if:  You have an injury that is not healing.  You have cuts or breaks in the skin.  You have an ingrown nail.  You notice redness on your legs or feet.  You feel burning or tingling in your legs or feet.  You have pain or cramps in your legs and feet.  Your legs or feet are numb.  Your feet always feel cold. Get help right away if:  There is increasing redness, swelling, or pain in or around a wound.  There is a red line that goes up your leg.  Pus is coming from a wound.  You develop a fever or as directed  by your health care provider.  You notice a bad smell coming from an ulcer or wound. This information is not intended to replace advice given to you by your health care provider. Make sure you discuss any questions you have with your health care provider. Document Released: 08/02/2000 Document Revised: 01/11/2016 Document Reviewed: 01/12/2013 Elsevier Interactive Patient Education  2017 [employer].

## 2018-04-30 LAB — CBC WITH DIFFERENTIAL/PLATELET
BASOS: 1 %
Basophils Absolute: 0 10*3/uL (ref 0.0–0.2)
EOS (ABSOLUTE): 0.4 10*3/uL (ref 0.0–0.4)
EOS: 6 %
HEMATOCRIT: 42.9 % (ref 37.5–51.0)
Hemoglobin: 14.7 g/dL (ref 13.0–17.7)
IMMATURE GRANULOCYTES: 0 %
Immature Grans (Abs): 0 10*3/uL (ref 0.0–0.1)
LYMPHS ABS: 2.3 10*3/uL (ref 0.7–3.1)
Lymphs: 38 %
MCH: 27.3 pg (ref 26.6–33.0)
MCHC: 34.3 g/dL (ref 31.5–35.7)
MCV: 80 fL (ref 79–97)
MONOS ABS: 0.7 10*3/uL (ref 0.1–0.9)
Monocytes: 12 %
NEUTROS ABS: 2.6 10*3/uL (ref 1.4–7.0)
NEUTROS PCT: 43 %
PLATELETS: 255 10*3/uL (ref 150–450)
RBC: 5.38 x10E6/uL (ref 4.14–5.80)
RDW: 15 % (ref 12.3–15.4)
WBC: 5.9 10*3/uL (ref 3.4–10.8)

## 2018-04-30 LAB — LIPID PANEL
CHOL/HDL RATIO: 4.6 ratio (ref 0.0–5.0)
Cholesterol, Total: 124 mg/dL (ref 100–199)
HDL: 27 mg/dL — ABNORMAL LOW (ref >39)
LDL CALC: 70 mg/dL (ref 0–99)
Triglycerides: 135 mg/dL (ref 0–149)
VLDL Cholesterol Cal: 27 mg/dL (ref 5–40)

## 2018-04-30 LAB — CMP14+EGFR
A/G RATIO: 1 — AB (ref 1.2–2.2)
ALBUMIN: 3.9 g/dL (ref 3.5–5.5)
ALK PHOS: 123 IU/L — AB (ref 39–117)
ALT: 18 IU/L (ref 0–44)
AST: 16 IU/L (ref 0–40)
BUN / CREAT RATIO: 16 (ref 9–20)
BUN: 26 mg/dL — ABNORMAL HIGH (ref 6–24)
Bilirubin Total: 0.5 mg/dL (ref 0.0–1.2)
CALCIUM: 9.6 mg/dL (ref 8.7–10.2)
CO2: 27 mmol/L (ref 20–29)
Chloride: 101 mmol/L (ref 96–106)
Creatinine, Ser: 1.64 mg/dL — ABNORMAL HIGH (ref 0.76–1.27)
GFR calc Af Amer: 53 mL/min/{1.73_m2} — ABNORMAL LOW (ref >59)
GFR, EST NON AFRICAN AMERICAN: 45 mL/min/{1.73_m2} — AB (ref >59)
GLOBULIN, TOTAL: 4 g/dL (ref 1.5–4.5)
Glucose: 101 mg/dL — ABNORMAL HIGH (ref 65–99)
POTASSIUM: 4.3 mmol/L (ref 3.5–5.2)
Sodium: 142 mmol/L (ref 134–144)
Total Protein: 7.9 g/dL (ref 6.0–8.5)

## 2018-04-30 LAB — PSA: PROSTATE SPECIFIC AG, SERUM: 0.5 ng/mL (ref 0.0–4.0)

## 2018-04-30 LAB — HIV ANTIBODY (ROUTINE TESTING W REFLEX): HIV Screen 4th Generation wRfx: NONREACTIVE

## 2018-04-30 LAB — TSH: TSH: 2.32 u[IU]/mL (ref 0.450–4.500)

## 2018-06-10 ENCOUNTER — Other Ambulatory Visit: Payer: Self-pay

## 2018-06-10 MED ORDER — AMLODIPINE-OLMESARTAN 10-40 MG PO TABS: 1.0000 | ORAL_TABLET | Freq: Every day | ORAL | 1 refills | Status: DC

## 2018-06-11 ENCOUNTER — Other Ambulatory Visit: Payer: Self-pay | Admitting: *Deleted

## 2018-06-11 MED ORDER — CARVEDILOL 25 MG PO TABS: 25.0000 mg | ORAL_TABLET | Freq: Two times a day (BID) | ORAL | 1 refills | Status: DC

## 2018-06-11 MED ORDER — CLONIDINE HCL 0.1 MG PO TABS: 0.1000 mg | ORAL_TABLET | Freq: Two times a day (BID) | ORAL | 1 refills | Status: DC

## 2018-06-12 ENCOUNTER — Telehealth: Payer: Self-pay | Admitting: Family Medicine

## 2018-06-18 DIAGNOSIS — Z6841 Body Mass Index (BMI) 40.0 and over, adult: Secondary | ICD-10-CM | POA: Diagnosis not present

## 2018-06-18 DIAGNOSIS — M1711 Unilateral primary osteoarthritis, right knee: Secondary | ICD-10-CM | POA: Diagnosis not present

## 2018-06-23 NOTE — Telephone Encounter (Signed)
Pt has all meds now per wife

## 2018-07-02 ENCOUNTER — Other Ambulatory Visit: Payer: Self-pay | Admitting: Family Medicine

## 2018-07-17 ENCOUNTER — Other Ambulatory Visit: Payer: Self-pay | Admitting: Family Medicine

## 2018-07-29 ENCOUNTER — Encounter: Payer: Self-pay | Admitting: Family Medicine

## 2018-07-29 ENCOUNTER — Ambulatory Visit: Payer: BLUE CROSS/BLUE SHIELD | Admitting: Family Medicine

## 2018-07-29 VITALS — BP 138/84 | HR 89 | Temp 97.8°F | Ht 74.0 in | Wt 380.0 lb

## 2018-07-29 DIAGNOSIS — I152 Hypertension secondary to endocrine disorders: Secondary | ICD-10-CM

## 2018-07-29 DIAGNOSIS — E785 Hyperlipidemia, unspecified: Secondary | ICD-10-CM

## 2018-07-29 DIAGNOSIS — E1169 Type 2 diabetes mellitus with other specified complication: Secondary | ICD-10-CM

## 2018-07-29 DIAGNOSIS — Z23 Encounter for immunization: Secondary | ICD-10-CM | POA: Diagnosis not present

## 2018-07-29 DIAGNOSIS — E1159 Type 2 diabetes mellitus with other circulatory complications: Secondary | ICD-10-CM

## 2018-07-29 DIAGNOSIS — E119 Type 2 diabetes mellitus without complications: Secondary | ICD-10-CM

## 2018-07-29 DIAGNOSIS — M10372 Gout due to renal impairment, left ankle and foot: Secondary | ICD-10-CM | POA: Diagnosis not present

## 2018-07-29 DIAGNOSIS — B351 Tinea unguium: Secondary | ICD-10-CM | POA: Diagnosis not present

## 2018-07-29 DIAGNOSIS — I10 Essential (primary) hypertension: Secondary | ICD-10-CM

## 2018-07-29 LAB — CMP14+EGFR
ALK PHOS: 131 IU/L — AB (ref 39–117)
ALT: 17 IU/L (ref 0–44)
AST: 17 IU/L (ref 0–40)
Albumin/Globulin Ratio: 1 — ABNORMAL LOW (ref 1.2–2.2)
Albumin: 4.1 g/dL (ref 3.5–5.5)
BILIRUBIN TOTAL: 0.5 mg/dL (ref 0.0–1.2)
BUN/Creatinine Ratio: 16 (ref 9–20)
BUN: 25 mg/dL — ABNORMAL HIGH (ref 6–24)
CO2: 26 mmol/L (ref 20–29)
Calcium: 9.6 mg/dL (ref 8.7–10.2)
Chloride: 101 mmol/L (ref 96–106)
Creatinine, Ser: 1.59 mg/dL — ABNORMAL HIGH (ref 0.76–1.27)
GFR calc Af Amer: 55 mL/min/{1.73_m2} — ABNORMAL LOW (ref >59)
GFR, EST NON AFRICAN AMERICAN: 47 mL/min/{1.73_m2} — AB (ref >59)
GLOBULIN, TOTAL: 4 g/dL (ref 1.5–4.5)
Glucose: 86 mg/dL (ref 65–99)
Potassium: 4.4 mmol/L (ref 3.5–5.2)
Sodium: 143 mmol/L (ref 134–144)
Total Protein: 8.1 g/dL (ref 6.0–8.5)

## 2018-07-29 LAB — BAYER DCA HB A1C WAIVED: HB A1C: 7.3 % — AB (ref <7.0)

## 2018-07-29 MED ORDER — PREDNISONE 10 MG (21) PO TBPK: ORAL_TABLET | ORAL | 0 refills | Status: DC

## 2018-07-29 MED ORDER — ALLOPURINOL 100 MG PO TABS: 50.0000 mg | ORAL_TABLET | Freq: Every day | ORAL | 2 refills | Status: DC

## 2018-07-29 NOTE — Patient Instructions (Signed)
I prescribed you prednisone Dosepak for the gout flare.  Colchicine interacts with your cholesterol medicine and therefore this was not prescribed.  Once your gout flare resolves, you may start the allopurinol.  Take 1/2 tablet daily to prevent future gout flares.

## 2018-07-29 NOTE — Progress Notes (Signed)
Subjective: CC: DM2 PCP: Jon Norlander, DO Jon Moore is a 57 y.o. male presenting to clinic today for:  1. Type 2 Diabetes w/ morbid obesity/ HTN and HLD Patient reports: Glucometer: One Touch ultra, High at home: 115; Low at home: 91, Taking medication(s): Metformin 1000 p.o. twice daily, Invokana 300 mg daily, Soliqua 45 units daily, Lasix BID, Coreg BID, Clonidine BID, Crestor qhs.  Side effects: None. Last eye exam: December 2018 Last foot exam: June 2019 Last A1c: Sept 2019 was 8.1 Nephropathy screen indicated?:  On an ARB Last flu, zoster and/or pneumovax: Needs influenza  ROS: Denies LOC, polyuria, polydipsia, unintended weight loss/gain, current foot ulcerations, numbness or tingling in extremities or chest pain.  He is concerned about a spot on the left foot.  He thought he felt a sore on his heel.  He has rx for DM shoes but has not filled it yet.  2. Gout Patient reports almost monthly flares of gout in various joints.  His last flare was in the right knee and was treated with a corticosteroid injection by his orthopedist.  He notes that he developed a flare in his left great toe over the last couple of days.  Symptoms were previously treated with prednisone by his previous PCP, which did help.  He is not on any maintenance therapies.  He reports pain and discoloration.  ROS: Per HPI  No Known Allergies Past Medical History:  Diagnosis Date  . Diabetes mellitus   . Frozen shoulder syndrome 2013   Right shoulder.  Has had PT.  Marland Kitchen Hypercholesterolemia   . Hypertension     Current Outpatient Medications:  .  amLODipine-olmesartan (AZOR) 10-40 MG tablet, Take 1 tablet by mouth daily., Disp: 90 tablet, Rfl: 1 .  carvedilol (COREG) 25 MG tablet, Take 1 tablet (25 mg total) by mouth 2 (two) times daily with a meal., Disp: 180 tablet, Rfl: 1 .  cloNIDine (CATAPRES) 0.1 MG tablet, Take 1 tablet (0.1 mg total) by mouth 2 (two) times daily., Disp: 180 tablet, Rfl:  1 .  furosemide (LASIX) 80 MG tablet, Take 1 tablet (80 mg total) by mouth 2 (two) times daily., Disp: 180 tablet, Rfl: 3 .  Insulin Glargine-Lixisenatide (SOLIQUA) 100-33 UNT-MCG/ML SOPN, Inject 30-60 Units into the skin daily., Disp: 18 mL, Rfl: 11 .  Insulin Pen Needle 29G X 12.7MM MISC, Use to inject Lantus once qd, Disp: 100 each, Rfl: 1 .  INVOKANA 300 MG TABS tablet, TAKE 1 TABLET BY MOUTH ONCE DAILY, Disp: 90 tablet, Rfl: 0 .  meloxicam (MOBIC) 15 MG tablet, TAKE 1 TABLET BY MOUTH ONCE DAILY, Disp: 90 tablet, Rfl: 0 .  metFORMIN (GLUCOPHAGE) 1000 MG tablet, TAKE 1 TABLET BY MOUTH TWICE DAILY WITH A MEAL, Disp: 180 tablet, Rfl: 0 .  mometasone (NASONEX) 50 MCG/ACT nasal spray, Place 2 sprays into the nose daily., Disp: 51 g, Rfl: 1 .  rosuvastatin (CRESTOR) 5 MG tablet, TAKE 1 TABLET BY MOUTH ONCE DAILY, Disp: 90 tablet, Rfl: 0 Social History   Socioeconomic History  . Marital status: Married    Spouse name: Not on file  . Number of children: 1  . Years of education: Not on file  . Highest education level: Not on file  Occupational History  . Occupation: [place] [employer]    Comment: Engineer, building services   Social Needs  . Financial resource strain: Not on file  . Food insecurity:    Worry: Not on file  Inability: Not on file  . Transportation needs:    Medical: Not on file    Non-medical: Not on file  Tobacco Use  . Smoking status: Never Smoker  . Smokeless tobacco: Never Used  Substance and Sexual Activity  . Alcohol use: No  . Drug use: No  . Sexual activity: Not on file  Lifestyle  . Physical activity:    Days per week: Not on file    Minutes per session: Not on file  . Stress: Not on file  Relationships  . Social connections:    Talks on phone: Not on file    Gets together: Not on file    Attends religious service: Not on file    Active member of club or organization: Not on file    Attends meetings of clubs or organizations: Not on file    Relationship  status: Not on file  . Intimate partner violence:    Fear of current or ex partner: Not on file    Emotionally abused: Not on file    Physically abused: Not on file    Forced sexual activity: Not on file  Other Topics Concern  . Not on file  Social History Narrative  . Not on file   Family History  Problem Relation Age of Onset  . Diabetes Mother   . Heart failure Mother   . Hypertension Father   . Kidney disease Father   . Cancer Sister   . Hodgkin's lymphoma Sister   . Hypertension Brother   . Hypertension Brother   . Hypertension Brother   . Colon cancer Neg Hx     Objective: Office vital signs reviewed. BP 138/84   Pulse 89   Temp 97.8 F (36.6 C) (Oral)   Ht 6' 2" (1.88 m)   Wt (!) 380 lb (172.4 kg)   BMI 48.79 kg/m   Physical Examination:  General: Awake, alert, obese. No acute distress HEENT: Normal, sclera white, MMM Cardio: regular rate and rhythm, S1S2 heard, no murmurs appreciated Pulm: clear to auscultation bilaterally, no wheezes, rhonchi or rales; normal work of breathing on room air Extremities: warm, well perfused, 1+pitting edema of bilateral LE to shins R>L.  He has compression hose in place. MSK: antalgic gait and normal station  Left great toe: Hyperpigmentation noted along the MTP.  This is tender to touch.  Mild increased warmth noted as well. Skin: venous stasis skin changes w/in the LE Neuro: see DM foot exam below  Diabetic Foot Form - Detailed   Diabetic Foot Exam - detailed Diabetic Foot exam was performed with the following findings:  Yes 07/29/2018 10:31 AM  Visual Foot Exam completed.:  Yes  Can the patient see the bottom of their feet?:  No Are the shoes appropriate in style and fit?:  Yes Is there swelling or and abnormal foot shape?:  No Is there a claw toe deformity?:  Yes Is there elevated skin temparature?:  No Is there foot or ankle muscle weakness?:  Yes Normal Range of Motion:  No Pulse Foot Exam completed.:  Yes  Right  posterior Tibialias:  Diminished Left posterior Tibialias:  Diminished  Right Dorsalis Pedis:  Diminished Left Dorsalis Pedis:  Diminished  Sensory Foot Exam Completed.:  Yes Semmes-Weinstein Monofilament Test R Site 1-Great Toe:  Pos L Site 1-Great Toe:  Pos    Comments:  He has an area along the medial aspect of the great toenail bed that has hemostatic bleed where he clipped a hang  nail earlier today.    Assessment/ Plan: 57 y.o. male   1. Type 2 diabetes mellitus without complication, unspecified whether long term insulin use (HCC) Close to goal at 7.3.  Continue current therapies.  Patient to continue reduction in salt.  Recommend diabetic shoes.  He has rx.  Referred to podiatry.  Flu vaccine administered. - Bayer DCA Hb A1c Waived - Ambulatory referral to Podiatry  2. Hyperlipidemia associated with type 2 diabetes mellitus ([place]) Continue crestor - CMP14+EGFR  3. Hypertension associated with diabetes ([place]) Controlled. Continue current antihypertensive regimen - CMP14+EGFR  4. Acute gout due to renal impairment involving toe of left foot Colchicine considered but there is a drug drug interaction with his Crestor and his renal function is somewhat impaired.  Instead, prednisone Dosepak prescribed.  He is to start renal-based allopurinol once gout flare has resolved.  This was discussed with the patient at length and reiterated on his AVS.  He will follow-up with me on a as needed basis for this issue  5. Onychomycosis of toenail Referred to podiatry - Ambulatory referral to Podiatry  6. Need for immunization against influenza - Flu Vaccine QUAD 36+ mos IM   Orders Placed This Encounter  Procedures  . Flu Vaccine QUAD 36+ mos IM  . CMP14+EGFR  . Bayer DCA Hb A1c Waived  . Ambulatory referral to Podiatry    Referral Priority:   Routine    Referral Type:   Consultation    Referral Reason:   Specialty Services Required    Requested Specialty:   Podiatry    Number of  Visits Requested:   1   Meds ordered this encounter  Medications  . allopurinol (ZYLOPRIM) 100 MG tablet    Sig: Take 0.5 tablets (50 mg total) by mouth daily.    Dispense:  45 tablet    Refill:  2  . predniSONE (STERAPRED UNI-PAK 21 TAB) 10 MG (21) TBPK tablet    Sig: As directed x 6 days    Dispense:  21 tablet    Refill:  0     Ashly Windell Moulding, DO [place] [phone]

## 2018-08-17 DIAGNOSIS — H52223 Regular astigmatism, bilateral: Secondary | ICD-10-CM | POA: Diagnosis not present

## 2018-08-17 DIAGNOSIS — H524 Presbyopia: Secondary | ICD-10-CM | POA: Diagnosis not present

## 2018-08-17 DIAGNOSIS — E119 Type 2 diabetes mellitus without complications: Secondary | ICD-10-CM | POA: Diagnosis not present

## 2018-08-17 DIAGNOSIS — H5201 Hypermetropia, right eye: Secondary | ICD-10-CM | POA: Diagnosis not present

## 2018-08-17 LAB — HM DIABETES EYE EXAM

## 2018-08-20 DIAGNOSIS — E1151 Type 2 diabetes mellitus with diabetic peripheral angiopathy without gangrene: Secondary | ICD-10-CM | POA: Diagnosis not present

## 2018-08-20 DIAGNOSIS — B351 Tinea unguium: Secondary | ICD-10-CM | POA: Diagnosis not present

## 2018-09-04 ENCOUNTER — Ambulatory Visit: Payer: BLUE CROSS/BLUE SHIELD

## 2018-09-17 DIAGNOSIS — M1711 Unilateral primary osteoarthritis, right knee: Secondary | ICD-10-CM | POA: Diagnosis not present

## 2018-09-17 DIAGNOSIS — Z6841 Body Mass Index (BMI) 40.0 and over, adult: Secondary | ICD-10-CM | POA: Diagnosis not present

## 2018-10-04 ENCOUNTER — Other Ambulatory Visit: Payer: Self-pay | Admitting: Family Medicine

## 2018-10-15 ENCOUNTER — Other Ambulatory Visit: Payer: Self-pay | Admitting: Family Medicine

## 2018-10-19 ENCOUNTER — Other Ambulatory Visit: Payer: Self-pay | Admitting: *Deleted

## 2018-10-19 MED ORDER — INSULIN PEN NEEDLE 29G X 12.7MM MISC: 3 refills | Status: DC

## 2018-10-28 ENCOUNTER — Other Ambulatory Visit: Payer: Self-pay

## 2018-10-28 ENCOUNTER — Encounter: Payer: Self-pay | Admitting: Family Medicine

## 2018-10-28 ENCOUNTER — Ambulatory Visit (INDEPENDENT_AMBULATORY_CARE_PROVIDER_SITE_OTHER): Payer: BLUE CROSS/BLUE SHIELD | Admitting: Family Medicine

## 2018-10-28 VITALS — BP 134/76 | HR 76 | Temp 97.4°F | Ht 74.0 in | Wt 392.4 lb

## 2018-10-28 DIAGNOSIS — M1039 Gout due to renal impairment, multiple sites: Secondary | ICD-10-CM

## 2018-10-28 DIAGNOSIS — E1122 Type 2 diabetes mellitus with diabetic chronic kidney disease: Secondary | ICD-10-CM | POA: Diagnosis not present

## 2018-10-28 DIAGNOSIS — Z794 Long term (current) use of insulin: Secondary | ICD-10-CM

## 2018-10-28 DIAGNOSIS — E1169 Type 2 diabetes mellitus with other specified complication: Secondary | ICD-10-CM

## 2018-10-28 DIAGNOSIS — E785 Hyperlipidemia, unspecified: Secondary | ICD-10-CM

## 2018-10-28 DIAGNOSIS — E1159 Type 2 diabetes mellitus with other circulatory complications: Secondary | ICD-10-CM

## 2018-10-28 DIAGNOSIS — N183 Chronic kidney disease, stage 3 (moderate): Secondary | ICD-10-CM

## 2018-10-28 DIAGNOSIS — I152 Hypertension secondary to endocrine disorders: Secondary | ICD-10-CM

## 2018-10-28 DIAGNOSIS — I10 Essential (primary) hypertension: Secondary | ICD-10-CM

## 2018-10-28 LAB — BASIC METABOLIC PANEL
BUN/Creatinine Ratio: 15 (ref 9–20)
BUN: 22 mg/dL (ref 6–24)
CO2: 27 mmol/L (ref 20–29)
Calcium: 9.5 mg/dL (ref 8.7–10.2)
Chloride: 102 mmol/L (ref 96–106)
Creatinine, Ser: 1.43 mg/dL — ABNORMAL HIGH (ref 0.76–1.27)
GFR calc Af Amer: 62 mL/min/{1.73_m2} (ref >59)
GFR calc non Af Amer: 54 mL/min/{1.73_m2} — ABNORMAL LOW (ref >59)
Glucose: 78 mg/dL (ref 65–99)
Potassium: 4.4 mmol/L (ref 3.5–5.2)
SODIUM: 143 mmol/L (ref 134–144)

## 2018-10-28 LAB — BAYER DCA HB A1C WAIVED: HB A1C (BAYER DCA - WAIVED): 7.3 % — ABNORMAL HIGH (ref <7.0)

## 2018-10-28 MED ORDER — FUROSEMIDE 80 MG PO TABS: 80.0000 mg | ORAL_TABLET | Freq: Two times a day (BID) | ORAL | 3 refills | Status: DC

## 2018-10-28 NOTE — Patient Instructions (Addendum)
I'm glad to hear the gout is getting better with the Allopurinol.  Continue the diabetes medications as directed.  We will await your kidney function tests to decide if we need to make any adjustments.

## 2018-10-28 NOTE — Progress Notes (Signed)
Subjective: CC: DM2 PCP: Raliegh Ip, DO EPP:IRJJOAC Jon Moore is a 58 y.o. male presenting to clinic today for:  1. Type 2 Diabetes w/ morbid obesity/ HTN and HLD Patient reports: Glucometer: One Touch ultra, High at home: 130; Low at home: 80 (does feel symptomatic at this level), Taking medication(s): Metformin 1000 p.o. twice daily, Invokana 300 mg daily, Soliqua 42-45 units daily, Lasix BID, Coreg BID, Clonidine BID, Crestor qhs.  Side effects: None.  He does admit that he is not been following a diet, particularly over the holidays and is fearful that his blood sugar will be elevated today. Last eye exam: December 2019, no retinopathy Last foot exam: June 2019 Last A1c:  Lab Results  Component Value Date   HGBA1C 7.3 (H) 07/29/2018  Nephropathy screen indicated?:  On an ARB Last flu, zoster and/or pneumovax: UTD  ROS: Denies LOC, polyuria, polydipsia, unintended weight loss/gain, current foot ulcerations, numbness or tingling in extremities or chest pain.    2. Gout Patient started on allopurinol at last visit after reporting almost monthly flares of gout in various joints.  He has not had a gout flare in a while now.  He notes that he found difficulty breaking the allopurinol in half and therefore has been taking 100 mg every other day.  He is very pleased with how things are going from a joint standpoint with the medication.  ROS: Per HPI  No Known Allergies Past Medical History:  Diagnosis Date  . Diabetes mellitus   . Frozen shoulder syndrome 2013   Right shoulder.  Has had PT.  Marland Kitchen Hypercholesterolemia   . Hypertension     Current Outpatient Medications:  .  allopurinol (ZYLOPRIM) 100 MG tablet, Take 0.5 tablets (50 mg total) by mouth daily., Disp: 45 tablet, Rfl: 2 .  amLODipine-olmesartan (AZOR) 10-40 MG tablet, Take 1 tablet by mouth daily., Disp: 90 tablet, Rfl: 1 .  carvedilol (COREG) 25 MG tablet, Take 1 tablet (25 mg total) by mouth 2 (two) times daily with  a meal., Disp: 180 tablet, Rfl: 1 .  cloNIDine (CATAPRES) 0.1 MG tablet, Take 1 tablet (0.1 mg total) by mouth 2 (two) times daily., Disp: 180 tablet, Rfl: 1 .  furosemide (LASIX) 80 MG tablet, Take 1 tablet (80 mg total) by mouth 2 (two) times daily., Disp: 180 tablet, Rfl: 3 .  Insulin Glargine-Lixisenatide (SOLIQUA) 100-33 UNT-MCG/ML SOPN, Inject 30-60 Units into the skin daily., Disp: 18 mL, Rfl: 11 .  Insulin Pen Needle 29G X 12.7MM MISC, Use to inject Lantus once qd, Disp: 100 each, Rfl: 3 .  INVOKANA 300 MG TABS tablet, TAKE 1 TABLET BY MOUTH ONCE DAILY, Disp: 90 tablet, Rfl: 1 .  meloxicam (MOBIC) 15 MG tablet, TAKE 1 TABLET BY MOUTH ONCE DAILY, Disp: 90 tablet, Rfl: 0 .  metFORMIN (GLUCOPHAGE) 1000 MG tablet, TAKE 1 TABLET BY MOUTH TWICE DAILY WITH A MEAL, Disp: 180 tablet, Rfl: 0 .  mometasone (NASONEX) 50 MCG/ACT nasal spray, Place 2 sprays into the nose daily., Disp: 51 g, Rfl: 1 .  predniSONE (STERAPRED UNI-PAK 21 TAB) 10 MG (21) TBPK tablet, As directed x 6 days, Disp: 21 tablet, Rfl: 0 .  rosuvastatin (CRESTOR) 5 MG tablet, Take 1 tablet by mouth once daily, Disp: 90 tablet, Rfl: 0 Social History   Socioeconomic History  . Marital status: Married    Spouse name: Not on file  . Number of children: 1  . Years of education: Not on file  . Highest  education level: Not on file  Occupational History  . Occupation: [employer]    Comment: Solicitorplant manager   Social Needs  . Financial resource strain: Not on file  . Food insecurity:    Worry: Not on file    Inability: Not on file  . Transportation needs:    Medical: Not on file    Non-medical: Not on file  Tobacco Use  . Smoking status: Never Smoker  . Smokeless tobacco: Never Used  Substance and Sexual Activity  . Alcohol use: No  . Drug use: No  . Sexual activity: Not on file  Lifestyle  . Physical activity:    Days per week: Not on file    Minutes per session: Not on file  . Stress: Not on file    Relationships  . Social connections:    Talks on phone: Not on file    Gets together: Not on file    Attends religious service: Not on file    Active member of club or organization: Not on file    Attends meetings of clubs or organizations: Not on file    Relationship status: Not on file  . Intimate partner violence:    Fear of current or ex partner: Not on file    Emotionally abused: Not on file    Physically abused: Not on file    Forced sexual activity: Not on file  Other Topics Concern  . Not on file  Social History Narrative  . Not on file   Family History  Problem Relation Age of Onset  . Diabetes Mother   . Heart failure Mother   . Hypertension Father   . Kidney disease Father   . Cancer Sister   . Hodgkin's lymphoma Sister   . Hypertension Brother   . Hypertension Brother   . Hypertension Brother   . Colon cancer Neg Hx     Objective: Office vital signs reviewed. BP 134/76   Pulse 76   Temp (!) 97.4 F (36.3 C) (Oral)   Ht 6\' 2"  (1.88 m)   Wt (!) 392 lb 6.4 oz (178 kg)   BMI 50.38 kg/m   Physical Examination:  General: Awake, alert, obese. No acute distress HEENT: Normal, sclera white, MMM Cardio: regular rate and rhythm, S1S2 heard, no murmurs appreciated Pulm: clear to auscultation bilaterally, no wheezes, rhonchi or rales; normal work of breathing on room air Extremities: warm, well perfused, 1+pitting edema of bilateral LE to shins R>L.  Compression hose in place.  Assessment/ Plan: 58 y.o. male   1. Type 2 diabetes mellitus w/ CKD3, on insulin Still not at goal.  His A1c has remained stable despite not following a diet over the holidays.  I will await his lab results.  He is somewhat borderline with his renal function with regards to ongoing metformin use.  We discussed that we may consider titrating his injectable.  He is to resume a low-carb diet.  We will follow-up in about 3 months for recheck - Bayer DCA Hb A1c Waived  2. Hyperlipidemia  associated with type 2 diabetes mellitus (HCC) Continue statin  3. Hypertension associated with diabetes (HCC) Under control continue current regimen. - Basic Metabolic Panel  4. Morbid obesity (HCC) Recommended lifestyle modification as above  5. Gout of multiple sites due to renal impairment, unspecified chronicity Doing well on allopurinol 100 mg q. OD.  Orders Placed This Encounter  Procedures  . Bayer DCA Hb A1c Waived  . Basic  Metabolic Panel   Meds ordered this encounter  Medications  . furosemide (LASIX) 80 MG tablet    Sig: Take 1 tablet (80 mg total) by mouth 2 (two) times daily.    Dispense:  180 tablet    Refill:  3     Ashly Hulen Skains, DO [facility] [phone]

## 2018-10-29 DIAGNOSIS — B351 Tinea unguium: Secondary | ICD-10-CM | POA: Diagnosis not present

## 2018-12-04 ENCOUNTER — Telehealth: Payer: Self-pay

## 2018-12-04 NOTE — Telephone Encounter (Signed)
Patient's insurance has denied [place].

## 2018-12-05 ENCOUNTER — Encounter: Payer: Self-pay | Admitting: Family Medicine

## 2018-12-07 ENCOUNTER — Other Ambulatory Visit: Payer: Self-pay | Admitting: Family Medicine

## 2018-12-07 MED ORDER — INSULIN GLARGINE 100 UNIT/ML SOLOSTAR PEN: 40.0000 [IU] | PEN_INJECTOR | Freq: Every day | SUBCUTANEOUS | 0 refills | Status: DC

## 2018-12-08 ENCOUNTER — Other Ambulatory Visit: Payer: Self-pay | Admitting: Family Medicine

## 2018-12-30 ENCOUNTER — Other Ambulatory Visit: Payer: Self-pay | Admitting: Family Medicine

## 2019-01-07 DIAGNOSIS — B351 Tinea unguium: Secondary | ICD-10-CM | POA: Diagnosis not present

## 2019-01-07 DIAGNOSIS — M79676 Pain in unspecified toe(s): Secondary | ICD-10-CM | POA: Diagnosis not present

## 2019-01-11 ENCOUNTER — Other Ambulatory Visit: Payer: Self-pay | Admitting: Family Medicine

## 2019-01-29 ENCOUNTER — Other Ambulatory Visit: Payer: Self-pay

## 2019-02-01 ENCOUNTER — Other Ambulatory Visit: Payer: Self-pay | Admitting: Family Medicine

## 2019-02-01 ENCOUNTER — Other Ambulatory Visit: Payer: Self-pay

## 2019-02-01 ENCOUNTER — Ambulatory Visit: Payer: BLUE CROSS/BLUE SHIELD | Admitting: Family Medicine

## 2019-02-01 ENCOUNTER — Encounter: Payer: Self-pay | Admitting: Family Medicine

## 2019-02-01 VITALS — BP 136/80 | HR 83 | Temp 97.7°F | Ht 74.0 in | Wt 388.0 lb

## 2019-02-01 DIAGNOSIS — M1731 Unilateral post-traumatic osteoarthritis, right knee: Secondary | ICD-10-CM | POA: Diagnosis not present

## 2019-02-01 DIAGNOSIS — E785 Hyperlipidemia, unspecified: Secondary | ICD-10-CM

## 2019-02-01 DIAGNOSIS — E1169 Type 2 diabetes mellitus with other specified complication: Secondary | ICD-10-CM

## 2019-02-01 DIAGNOSIS — E1159 Type 2 diabetes mellitus with other circulatory complications: Secondary | ICD-10-CM | POA: Diagnosis not present

## 2019-02-01 DIAGNOSIS — M1039 Gout due to renal impairment, multiple sites: Secondary | ICD-10-CM

## 2019-02-01 DIAGNOSIS — E1122 Type 2 diabetes mellitus with diabetic chronic kidney disease: Secondary | ICD-10-CM

## 2019-02-01 DIAGNOSIS — N183 Chronic kidney disease, stage 3 unspecified: Secondary | ICD-10-CM

## 2019-02-01 DIAGNOSIS — I152 Hypertension secondary to endocrine disorders: Secondary | ICD-10-CM

## 2019-02-01 DIAGNOSIS — Z794 Long term (current) use of insulin: Secondary | ICD-10-CM

## 2019-02-01 DIAGNOSIS — I10 Essential (primary) hypertension: Secondary | ICD-10-CM

## 2019-02-01 DIAGNOSIS — E1165 Type 2 diabetes mellitus with hyperglycemia: Secondary | ICD-10-CM

## 2019-02-01 LAB — BASIC METABOLIC PANEL
BUN/Creatinine Ratio: 17 (ref 9–20)
BUN: 25 mg/dL — ABNORMAL HIGH (ref 6–24)
CO2: 23 mmol/L (ref 20–29)
Calcium: 9.3 mg/dL (ref 8.7–10.2)
Chloride: 102 mmol/L (ref 96–106)
Creatinine, Ser: 1.44 mg/dL — ABNORMAL HIGH (ref 0.76–1.27)
GFR calc Af Amer: 61 mL/min/{1.73_m2} (ref >59)
GFR calc non Af Amer: 53 mL/min/{1.73_m2} — ABNORMAL LOW (ref >59)
Glucose: 126 mg/dL — ABNORMAL HIGH (ref 65–99)
Potassium: 4.3 mmol/L (ref 3.5–5.2)
Sodium: 142 mmol/L (ref 134–144)

## 2019-02-01 LAB — BAYER DCA HB A1C WAIVED: HB A1C (BAYER DCA - WAIVED): 8 % — ABNORMAL HIGH (ref <7.0)

## 2019-02-01 MED ORDER — LANTUS SOLOSTAR 100 UNIT/ML ~~LOC~~ SOPN: 60.0000 [IU] | PEN_INJECTOR | Freq: Every day | SUBCUTANEOUS | 3 refills | Status: DC

## 2019-02-01 MED ORDER — ALLOPURINOL 100 MG PO TABS: 50.0000 mg | ORAL_TABLET | Freq: Every day | ORAL | 2 refills | Status: DC

## 2019-02-01 NOTE — Patient Instructions (Signed)
You had labs performed today.  You will be contacted with the results of the labs once they are available, usually in the next 3 business days for routine lab work.  If you had a pap smear or biopsy performed, expect to be contacted in about 7-10 days.  

## 2019-02-01 NOTE — Progress Notes (Signed)
Subjective: CC: DM2, HTN, HLD PCP: Jon Moore, Ashly M, DO ZOX:WRUEAVWHPI:Jon Moore is a 58 y.o. male presenting to clinic today for:  1. Type 2 Diabetes w/ HTN and HLD Patient reports: Glucometer: One Touch ultra, High at home: 105-110; Low at home: 3685 (no symptoms), Taking medication(s): Metformin 1000 p.o. twice daily, Invokana 300 mg daily, Lantus 60 units daily (previously on soliqua but insurance no longer covers), Lasix BID, Coreg BID, Clonidine BID, Crestor qhs.  Side effects: None.  Last eye exam: December 2019, no retinopathy Last foot exam: 07/2018 Last A1c:  Lab Results  Component Value Date   HGBA1C 7.3 (H) 10/28/2018  Nephropathy screen indicated?:  On an ARB Last flu, zoster and/or pneumovax: UTD Immunization History  Administered Date(s) Administered  . Influenza,inj,Quad PF,6+ Mos 07/19/2014, 06/14/2016, 07/15/2017, 07/29/2018  . Influenza-Unspecified 07/19/2013  . Pneumococcal Conjugate-13 04/19/2014  . Pneumococcal Polysaccharide-23 04/29/2018  . Td 05/13/2011  . Tdap 05/13/2011   ROS: No dizziness, visual disturbance, numbness tingling, chest pain or shortness of breath.  2.  Chronic right-sided knee pain/gout Patient reports pretty good control of gout flares with allopurinol 100 mg q. OD.  He was unable to break the 100 mg in half and therefore takes it every other day.  He had one flare since initiation of medication and this was after he ate shrimp.  Right-sided knee pain has improved substantially after corticosteroid injection with the orthopedist.  He uses as needed Voltaren gel to the affected area which also helps.  ROS: Per HPI  No Known Allergies Past Medical History:  Diagnosis Date  . Diabetes mellitus   . Frozen shoulder syndrome 2013   Right shoulder.  Has had PT.  Jon Moore. Hypercholesterolemia   . Hypertension     Current Outpatient Medications:  .  allopurinol (ZYLOPRIM) 100 MG tablet, Take 0.5 tablets (50 mg total) by mouth daily., Disp: 45 tablet,  Rfl: 2 .  amLODipine-olmesartan (AZOR) 10-40 MG tablet, Take 1 tablet by mouth once daily, Disp: 90 tablet, Rfl: 0 .  carvedilol (COREG) 25 MG tablet, TAKE 1 TABLET BY MOUTH TWICE DAILY WITH A MEAL, Disp: 180 tablet, Rfl: 0 .  cloNIDine (CATAPRES) 0.1 MG tablet, Take 1 tablet by mouth twice daily, Disp: 180 tablet, Rfl: 0 .  diclofenac sodium (VOLTAREN) 1 % GEL, Apply 4 g topically 4 (four) times daily., Disp: , Rfl:  .  furosemide (LASIX) 80 MG tablet, Take 1 tablet (80 mg total) by mouth 2 (two) times daily., Disp: 180 tablet, Rfl: 3 .  Insulin Glargine (LANTUS SOLOSTAR) 100 UNIT/ML Solostar Pen, Inject 60 Units into the skin at bedtime., Disp: 60 mL, Rfl: 3 .  Insulin Pen Needle 29G X 12.7MM MISC, Use to inject Lantus once qd, Disp: 100 each, Rfl: 3 .  INVOKANA 300 MG TABS tablet, TAKE 1 TABLET BY MOUTH ONCE DAILY, Disp: 90 tablet, Rfl: 1 .  metFORMIN (GLUCOPHAGE) 1000 MG tablet, TAKE 1 TABLET BY MOUTH TWICE DAILY WITH A MEAL, Disp: 180 tablet, Rfl: 0 .  mometasone (NASONEX) 50 MCG/ACT nasal spray, Place 2 sprays into the nose daily., Disp: 51 g, Rfl: 1 .  rosuvastatin (CRESTOR) 5 MG tablet, Take 1 tablet by mouth once daily, Disp: 90 tablet, Rfl: 0 Social History   Socioeconomic History  . Marital status: Married    Spouse name: Not on file  . Number of children: 1  . Years of education: Not on file  . Highest education level: Not on file  Occupational History  .  Occupation: [employer]    Comment: Solicitorplant manager   Social Needs  . Financial resource strain: Not on file  . Food insecurity    Worry: Not on file    Inability: Not on file  . Transportation needs    Medical: Not on file    Non-medical: Not on file  Tobacco Use  . Smoking status: Never Smoker  . Smokeless tobacco: Never Used  Substance and Sexual Activity  . Alcohol use: No  . Drug use: No  . Sexual activity: Not on file  Lifestyle  . Physical activity    Days per week: Not on file    Minutes per  session: Not on file  . Stress: Not on file  Relationships  . Social Musicianconnections    Talks on phone: Not on file    Gets together: Not on file    Attends religious service: Not on file    Active member of club or organization: Not on file    Attends meetings of clubs or organizations: Not on file    Relationship status: Not on file  . Intimate partner violence    Fear of current or ex partner: Not on file    Emotionally abused: Not on file    Physically abused: Not on file    Forced sexual activity: Not on file  Other Topics Concern  . Not on file  Social History Narrative  . Not on file   Family History  Problem Relation Age of Onset  . Diabetes Mother   . Heart failure Mother   . Hypertension Father   . Kidney disease Father   . Cancer Sister   . Hodgkin's lymphoma Sister   . Hypertension Brother   . Hypertension Brother   . Hypertension Brother   . Colon cancer Neg Hx     Objective: Office vital signs reviewed. BP 136/80   Pulse 83   Temp 97.7 F (36.5 C) (Oral)   Ht 6\' 2"  (1.88 m)   Wt (!) 388 lb (176 kg)   BMI 49.82 kg/m   Physical Examination:  General: Awake, alert, obese. No acute distress HEENT: Normal, sclera white, MMM Cardio: regular rate and rhythm, S1S2 heard, no murmurs appreciated Pulm: clear to auscultation bilaterally, no wheezes, rhonchi or rales; normal work of breathing on room air Extremities: warm, well perfused, 1+pitting edema of bilateral LE to shins.  Compression hose in place.  Assessment/ Plan: 58 y.o. male   1. Type 2 diabetes mellitus with stage 3 chronic kidney disease, with long-term current use of insulin (HCC) Check A1c, BMP.  He has had some weight gain with the Lantus and since discontinuing the [place].  [facility]  Blood sugars sound well controlled with the 60 units nightly and therefore I have adjusted his prescription to reflect this.  He will follow-up in 3 months, sooner  if needed. - Bayer DCA Hb A1c Waived - Basic Metabolic Panel - Insulin Glargine (LANTUS SOLOSTAR) 100 UNIT/ML Solostar Pen; Inject 60 Units into the skin at bedtime.  Dispense: 60 mL; Refill: 3  2. Hyperlipidemia associated with type 2 diabetes mellitus (HCC) Continue statin  3. Hypertension associated with diabetes (HCC) Controlled.  Continue current regimen.  Check BMP - Basic Metabolic Panel  4. Post-traumatic osteoarthritis of right knee Doing well.  Followed by orthopedist.  Given corticosteroid injection recently  5. Gout of multiple sites due to renal impairment, unspecified chronicity Doing well.  Continue allopurinol.    Orders Placed This Encounter  Procedures  . Bayer DCA Hb A1c Waived  . Basic Metabolic Panel   Meds ordered this encounter  Medications  . Insulin Glargine (LANTUS SOLOSTAR) 100 UNIT/ML Solostar Pen    Sig: Inject 60 Units into the skin at bedtime.    Dispense:  60 mL    Refill:  3  . allopurinol (ZYLOPRIM) 100 MG tablet    Sig: Take 0.5 tablets (50 mg total) by mouth daily.    Dispense:  45 tablet    Refill:  [place], [place] [id number]

## 2019-03-06 ENCOUNTER — Other Ambulatory Visit: Payer: Self-pay | Admitting: Family Medicine

## 2019-03-25 DIAGNOSIS — E1142 Type 2 diabetes mellitus with diabetic polyneuropathy: Secondary | ICD-10-CM | POA: Diagnosis not present

## 2019-03-25 DIAGNOSIS — M79676 Pain in unspecified toe(s): Secondary | ICD-10-CM | POA: Diagnosis not present

## 2019-03-25 DIAGNOSIS — B351 Tinea unguium: Secondary | ICD-10-CM | POA: Diagnosis not present

## 2019-04-02 ENCOUNTER — Other Ambulatory Visit: Payer: Self-pay | Admitting: Family Medicine

## 2019-04-04 ENCOUNTER — Other Ambulatory Visit: Payer: Self-pay | Admitting: Family Medicine

## 2019-04-05 ENCOUNTER — Other Ambulatory Visit: Payer: Self-pay

## 2019-04-05 ENCOUNTER — Encounter: Payer: Self-pay | Admitting: "Endocrinology

## 2019-04-05 ENCOUNTER — Ambulatory Visit (INDEPENDENT_AMBULATORY_CARE_PROVIDER_SITE_OTHER): Payer: BC Managed Care – PPO | Admitting: "Endocrinology

## 2019-04-05 VITALS — BP 145/83 | HR 79 | Ht 74.0 in | Wt 389.0 lb

## 2019-04-05 DIAGNOSIS — I10 Essential (primary) hypertension: Secondary | ICD-10-CM | POA: Insufficient documentation

## 2019-04-05 DIAGNOSIS — N183 Chronic kidney disease, stage 3 (moderate): Secondary | ICD-10-CM | POA: Diagnosis not present

## 2019-04-05 DIAGNOSIS — E782 Mixed hyperlipidemia: Secondary | ICD-10-CM | POA: Insufficient documentation

## 2019-04-05 DIAGNOSIS — IMO0002 Reserved for concepts with insufficient information to code with codable children: Secondary | ICD-10-CM

## 2019-04-05 DIAGNOSIS — E1122 Type 2 diabetes mellitus with diabetic chronic kidney disease: Secondary | ICD-10-CM

## 2019-04-05 DIAGNOSIS — E1165 Type 2 diabetes mellitus with hyperglycemia: Secondary | ICD-10-CM

## 2019-04-05 DIAGNOSIS — Z794 Long term (current) use of insulin: Secondary | ICD-10-CM

## 2019-04-05 MED ORDER — METFORMIN HCL 1000 MG PO TABS: 500.0000 mg | ORAL_TABLET | Freq: Two times a day (BID) | ORAL | 2 refills | Status: DC

## 2019-04-05 MED ORDER — LANTUS SOLOSTAR 100 UNIT/ML ~~LOC~~ SOPN: 70.0000 [IU] | PEN_INJECTOR | Freq: Every day | SUBCUTANEOUS | 2 refills | Status: DC

## 2019-04-05 NOTE — Progress Notes (Signed)
Endocrinology Consult Note       04/05/2019, 9:39 AM   Subjective:    Patient ID: Jon Moore, male    DOB: [birth year].  Jon Moore is being seen in consultation for management of currently uncontrolled symptomatic diabetes requested by  Raliegh IpGottschalk, Ashly M, DO.   Past Medical History:  Diagnosis Date  . Diabetes mellitus   . Frozen shoulder syndrome 2013   Right shoulder.  Has had PT.  Marland Kitchen. Hypercholesterolemia   . Hypertension     Past Surgical History:  Procedure Laterality Date  . APPENDECTOMY    . KNEE SURGERY     right    Social History   Socioeconomic History  . Marital status: Married    Spouse name: Not on file  . Number of children: 1  . Years of education: Not on file  . Highest education level: Not on file  Occupational History  . Occupation: [employer]    Comment: Solicitorplant manager   Social Needs  . Financial resource strain: Not on file  . Food insecurity    Worry: Not on file    Inability: Not on file  . Transportation needs    Medical: Not on file    Non-medical: Not on file  Tobacco Use  . Smoking status: Never Smoker  . Smokeless tobacco: Never Used  Substance and Sexual Activity  . Alcohol use: No  . Drug use: No  . Sexual activity: Not on file  Lifestyle  . Physical activity    Days per week: Not on file    Minutes per session: Not on file  . Stress: Not on file  Relationships  . Social Musicianconnections    Talks on phone: Not on file    Gets together: Not on file    Attends religious service: Not on file    Active member of club or organization: Not on file    Attends meetings of clubs or organizations: Not on file    Relationship status: Not on file  Other Topics Concern  . Not on file  Social History Narrative  . Not on file    Family History  Problem Relation Age of Onset  . Diabetes Mother   . Heart failure Mother   . Hypertension  Father   . Kidney disease Father   . Cancer Sister   . Hodgkin's lymphoma Sister   . Hypertension Brother   . Hypertension Brother   . Hypertension Brother   . Colon cancer Neg Hx     Outpatient Encounter Medications as of 04/05/2019  Medication Sig  . allopurinol (ZYLOPRIM) 100 MG tablet Take 0.5 tablets (50 mg total) by mouth daily.  Marland Kitchen. amLODipine-olmesartan (AZOR) 10-40 MG tablet Take 1 tablet by mouth once daily  . carvedilol (COREG) 25 MG tablet TAKE 1 TABLET BY MOUTH TWICE DAILY WITH A MEAL  . cloNIDine (CATAPRES) 0.1 MG tablet Take 1 tablet by mouth twice daily  . diclofenac sodium (VOLTAREN) 1 % GEL Apply 4 g topically 4 (four) times daily.  . furosemide (LASIX) 80 MG tablet Take 1 tablet (80 mg total) by mouth 2 (  two) times daily.  . Insulin Glargine (LANTUS SOLOSTAR) 100 UNIT/ML Solostar Pen Inject 70 Units into the skin at bedtime.  . Insulin Pen Needle 29G X 12.7MM MISC Use to inject Lantus once qd  . metFORMIN (GLUCOPHAGE) 1000 MG tablet Take 0.5 tablets (500 mg total) by mouth 2 (two) times daily with a meal.  . mometasone (NASONEX) 50 MCG/ACT nasal spray Place 2 sprays into the nose daily.  . rosuvastatin (CRESTOR) 5 MG tablet Take 1 tablet by mouth once daily  . [DISCONTINUED] Insulin Glargine (LANTUS SOLOSTAR) 100 UNIT/ML Solostar Pen Inject 60 Units into the skin at bedtime.  . [DISCONTINUED] INVOKANA 300 MG TABS tablet Take 1 tablet by mouth once daily  . [DISCONTINUED] metFORMIN (GLUCOPHAGE) 1000 MG tablet TAKE 1 TABLET BY MOUTH TWICE DAILY WITH A MEAL   No facility-administered encounter medications on file as of 04/05/2019.     ALLERGIES: No Known Allergies  VACCINATION STATUS: Immunization History  Administered Date(s) Administered  . Influenza,inj,Quad PF,6+ Mos 07/19/2014, 06/14/2016, 07/15/2017, 07/29/2018  . Influenza-Unspecified 07/19/2013  . Pneumococcal Conjugate-13 04/19/2014  . Pneumococcal Polysaccharide-23 04/29/2018  . Td 05/13/2011  . Tdap  05/13/2011    Diabetes He presents for his initial diabetic visit. He has type 2 diabetes mellitus. Onset time: He was diagnosed at approximate age of 34 years. His disease course has been worsening. There are no hypoglycemic associated symptoms. Pertinent negatives for hypoglycemia include no confusion, headaches, pallor or seizures. Associated symptoms include polydipsia and polyuria. Pertinent negatives for diabetes include no chest pain, no fatigue, no polyphagia and no weakness. There are no hypoglycemic complications. Symptoms are worsening. Diabetic complications include nephropathy. Risk factors for coronary artery disease include dyslipidemia, diabetes mellitus, family history, hypertension, male sex, obesity and sedentary lifestyle. Current diabetic treatment includes insulin injections (He is currently on Lantus 60 units nightly, 110 mg daily, metformin 1000 g p.o. twice daily.). His weight is increasing steadily. He is following a generally unhealthy diet. When asked about meal planning, he reported none. He has not had a previous visit with a dietitian. He never participates in exercise. (Did not bring any logs nor meter to review.  His most recent A1c was 8%. Increased from 7.3% during the previous measurement.) An ACE inhibitor/angiotensin II receptor blocker is being taken. Eye exam is current (Patient reports cataracts.).  Hyperlipidemia This is a chronic problem. The current episode started more than 1 year ago. The problem is controlled. Exacerbating diseases include chronic renal disease, diabetes and obesity. Pertinent negatives include no chest pain, myalgias or shortness of breath. Current antihyperlipidemic treatment includes statins. Risk factors for coronary artery disease include diabetes mellitus, dyslipidemia, family history, obesity, male sex, hypertension and a sedentary lifestyle.  Hypertension This is a chronic problem. The current episode started more than 1 year ago. The  problem is uncontrolled. Pertinent negatives include no chest pain, headaches, neck pain, palpitations or shortness of breath. Risk factors for coronary artery disease include dyslipidemia, diabetes mellitus, male gender, obesity, sedentary lifestyle and family history. Past treatments include angiotensin blockers. Hypertensive end-organ damage includes kidney disease. Identifiable causes of hypertension include chronic renal disease.     Review of Systems  Constitutional: Negative for chills, fatigue, fever and unexpected weight change.  HENT: Negative for dental problem, mouth sores and trouble swallowing.   Eyes: Negative for visual disturbance.  Respiratory: Negative for cough, choking, chest tightness, shortness of breath and wheezing.   Cardiovascular: Negative for chest pain, palpitations and leg swelling.  Gastrointestinal: Negative for  abdominal distention, abdominal pain, constipation, diarrhea, nausea and vomiting.  Endocrine: Positive for polydipsia and polyuria. Negative for polyphagia.  Genitourinary: Negative for dysuria, flank pain, hematuria and urgency.  Musculoskeletal: Positive for gait problem. Negative for back pain, myalgias and neck pain.  Skin: Negative for pallor, rash and wound.  Neurological: Negative for seizures, syncope, weakness, numbness and headaches.  Psychiatric/Behavioral: Negative for confusion and dysphoric mood.    Objective:    BP (!) 145/83   Pulse 79   Ht 6\' 2"  (1.88 m)   Wt (!) 389 lb (176.4 kg)   BMI 49.94 kg/m   Wt Readings from Last 3 Encounters:  04/05/19 (!) 389 lb (176.4 kg)  02/01/19 (!) 388 lb (176 kg)  10/28/18 (!) 392 lb 6.4 oz (178 kg)     Physical Exam Constitutional:      General: He is not in acute distress.    Appearance: He is well-developed.  HENT:     Head: Normocephalic and atraumatic.  Neck:     Musculoskeletal: Normal range of motion and neck supple.     Thyroid: No thyromegaly.     Trachea: No tracheal  deviation.  Cardiovascular:     Rate and Rhythm: Normal rate.     Pulses:          Dorsalis pedis pulses are 1+ on the right side and 1+ on the left side.       Posterior tibial pulses are 1+ on the right side and 1+ on the left side.     Heart sounds: S1 normal and S2 normal. No murmur. No gallop.   Pulmonary:     Effort: Pulmonary effort is normal. No respiratory distress.     Breath sounds: No wheezing.  Abdominal:     General: There is no distension.     Tenderness: There is no abdominal tenderness. There is no guarding.  Musculoskeletal:     Right shoulder: He exhibits no swelling and no deformity.  Skin:    General: Skin is warm and dry.     Findings: No rash.     Nails: There is no clubbing.   Neurological:     Mental Status: He is alert and oriented to person, place, and time.     Cranial Nerves: No cranial nerve deficit.     Sensory: No sensory deficit.     Gait: Gait normal.     Deep Tendon Reflexes: Reflexes are normal and symmetric.  Psychiatric:        Speech: Speech normal.        Behavior: Behavior normal. Behavior is cooperative.        Thought Content: Thought content normal.        Judgment: Judgment normal.       CMP ( most recent) CMP     Component Value Date/Time   NA 142 02/01/2019 0820   K 4.3 02/01/2019 0820   CL 102 02/01/2019 0820   CO2 23 02/01/2019 0820   GLUCOSE 126 (H) 02/01/2019 0820   BUN 25 (H) 02/01/2019 0820   CREATININE 1.44 (H) 02/01/2019 0820   CALCIUM 9.3 02/01/2019 0820   PROT 8.1 07/29/2018 0903   ALBUMIN 4.1 07/29/2018 0903   AST 17 07/29/2018 0903   ALT 17 07/29/2018 0903   ALKPHOS 131 (H) 07/29/2018 0903   BILITOT 0.5 07/29/2018 0903   GFRNONAA 53 (L) 02/01/2019 0820   GFRAA 61 02/01/2019 0820     Diabetic Labs (most recent): Lab Results  Component  Value Date   HGBA1C 8.0 (H) 02/01/2019   HGBA1C 7.3 (H) 10/28/2018   HGBA1C 7.3 (H) 07/29/2018     Lipid Panel ( most recent) Lipid Panel     Component Value  Date/Time   CHOL 124 04/29/2018 0916   TRIG 135 04/29/2018 0916   HDL 27 (L) 04/29/2018 0916   CHOLHDL 4.6 04/29/2018 0916   LDLCALC 70 04/29/2018 0916      Lab Results  Component Value Date   TSH 2.320 04/29/2018   TSH 1.530 02/10/2014   TSH 1.780 10/19/2013   TSH 1.650 04/13/2013           Assessment & Plan:   1. Uncontrolled type 2 diabetes mellitus with stage 3 chronic kidney disease (HCC)  - Jon Moore has currently uncontrolled symptomatic type 2 DM since  58 years of age,  with most recent A1c of 8 %. Recent labs reviewed. - I had a long discussion with Jon Moore about the progressive nature of diabetes and the pathology behind its complications. -his diabetes is complicated by CKD and he remains at a high risk for more acute and chronic complications which include CAD, CVA, CKD, retinopathy, and neuropathy. These are all discussed in detail with Jon Moore.  - I have counseled Jon Moore on diet management and weight loss, by adopting a carbohydrate restricted/protein rich diet. - he admits that there is a room for improvement in his food and drink choices. - Suggestion is made for Jon Moore to avoid simple carbohydrates  from his diet including Cakes, Sweet Desserts, Ice Cream, Soda (diet and regular), Sweet Tea, Candies, Chips, Cookies, Store Bought Juices, Alcohol in Excess of  1-2 drinks a day, Artificial Sweeteners,  Coffee Creamer, and "Sugar-free" Products. This will help patient to have more stable blood glucose profile and potentially avoid unintended weight gain.  - I encouraged Jon Moore to switch to  unprocessed or minimally processed complex starch and increased protein intake (animal or plant source), fruits, and vegetables.  - he is advised to stick to a routine mealtimes to eat 3 meals  a day and avoid unnecessary snacks ( to snack only to correct hypoglycemia).   - he will be scheduled with Norm SaltPenny Crumpton, RDN, CDE for diabetes education.  - I have approached Jon Moore with the following  individualized plan to manage  his diabetes and patient agrees:   - he will continue to need insulin treatment in order for Jon Moore to achieve and maintain control of diabetes to target.  He is advised to increase his Lantus to 70 units daily at bedtime , and  associated with strict monitoring of glucose 2 times a day-before breakfast and at bedtime. - he is warned not to take insulin without proper monitoring per orders.  - he is encouraged to call clinic for blood glucose levels less than 70 or above 300 mg /dl. - he is advised to lower metformin to 500 mg p.o. twice daily , because of his CKD.    - his Theodis Satonvokana will be discontinued, risk outweighs benefit for this patient.  - he will be considered for incretin therapy as appropriate next visit.  - Patient specific target  A1c;  LDL, HDL, Triglycerides, and  Waist Circumference were discussed in detail.  2) Blood Pressure /Hypertension:  his blood pressure is uncontrolled to target.   he is advised to continue his current medications including clonidine 0.5 mg p.o. twice daily, carvedilol 25 mg p.o. twice daily, amlodipine/olmesartan 10/40 mg p.o. daily with breakfast . 3)  Lipids/Hyperlipidemia:   Review of his recent lipid panel showed controlled  LDL at 70 .  he  is advised to continue    Crestor 5 mg daily at bedtime.  Side effects and precautions discussed with Jon Moore.  4)  Weight/Diet:  Body mass index is 49.94 kg/m.  -   clearly complicating his diabetes care.  I discussed with Jon Moore the fact that loss of 5 - 10% of his  current body weight will have the most impact on his diabetes management.  CDE Consult will be initiated . Exercise, and detailed carbohydrates information provided  -  detailed on discharge instructions.  5) Chronic Care/Health Maintenance:  -he  is on ACEI/ARB and Statin medications and  is encouraged to initiate and continue to follow up with Ophthalmology, Dentist,  Podiatrist at least yearly or according to recommendations,  and advised to  stay away from smoking. I have recommended yearly flu vaccine and pneumonia vaccine at least every 5 years; moderate intensity exercise for up to 150 minutes weekly; and  sleep for at least 7 hours a day.  - he is  advised to maintain close follow up with Raliegh Ip, DO for primary care needs, as well as his other providers for optimal and coordinated care.  - Time spent with the patient: 45 minutes, of which >50% was spent in obtaining information about his symptoms, reviewing his previous labs/studies, evaluations, and treatments, counseling Jon Moore about his currently uncontrolled, complicated type 2 diabetes; hyperlipidemia, hypertension, and developing plans for long term treatment based on the latest standards of care/guidelines.  Please refer to " Patient Self Inventory" in the Media  tab for reviewed elements of pertinent patient history.  Jon Stack participated in the discussions, expressed understanding, and voiced agreement with the above plans.  All questions were answered to his satisfaction. he is encouraged to contact clinic should he have any questions or concerns prior to his return visit.  Follow up plan: - Return in about 5 weeks (around 05/10/2019) for Bring Meter and Logs- A1c in Office.  Marquis Lunch, MD [facility] [address] [place], [place] [address] Phone: [phone]  Fax: [phone]    04/05/2019, 9:39 AM  This note was partially dictated with voice recognition software. Similar sounding words can be transcribed inadequately or may not  be corrected upon review.

## 2019-04-05 NOTE — Patient Instructions (Signed)

## 2019-04-13 ENCOUNTER — Telehealth: Payer: Self-pay | Admitting: "Endocrinology

## 2019-04-13 MED ORDER — GLUCOSE BLOOD VI STRP: 1.0000 | ORAL_STRIP | Freq: Two times a day (BID) | 5 refills | Status: DC

## 2019-04-13 NOTE — Telephone Encounter (Signed)
Patient is requesting a script for test strips for one touch verio. [facility] in [place]

## 2019-04-13 NOTE — Telephone Encounter (Signed)
Rx sent 

## 2019-04-14 ENCOUNTER — Other Ambulatory Visit: Payer: Self-pay

## 2019-04-14 MED ORDER — BLOOD GLUCOSE METER KIT: PACK | 5 refills | Status: DC

## 2019-04-15 ENCOUNTER — Other Ambulatory Visit: Payer: Self-pay | Admitting: Family Medicine

## 2019-04-30 ENCOUNTER — Other Ambulatory Visit: Payer: Self-pay | Admitting: Family Medicine

## 2019-04-30 DIAGNOSIS — Z794 Long term (current) use of insulin: Secondary | ICD-10-CM

## 2019-04-30 DIAGNOSIS — E1122 Type 2 diabetes mellitus with diabetic chronic kidney disease: Secondary | ICD-10-CM

## 2019-05-06 ENCOUNTER — Ambulatory Visit: Payer: BC Managed Care – PPO | Admitting: Family Medicine

## 2019-05-12 ENCOUNTER — Ambulatory Visit: Payer: BC Managed Care – PPO | Admitting: "Endocrinology

## 2019-05-12 ENCOUNTER — Encounter: Payer: Self-pay | Admitting: "Endocrinology

## 2019-05-12 ENCOUNTER — Other Ambulatory Visit: Payer: Self-pay

## 2019-05-12 VITALS — BP 161/89 | HR 71 | Temp 97.5°F | Ht 72.0 in | Wt 378.2 lb

## 2019-05-12 DIAGNOSIS — I10 Essential (primary) hypertension: Secondary | ICD-10-CM | POA: Diagnosis not present

## 2019-05-12 DIAGNOSIS — E1122 Type 2 diabetes mellitus with diabetic chronic kidney disease: Secondary | ICD-10-CM

## 2019-05-12 DIAGNOSIS — E1165 Type 2 diabetes mellitus with hyperglycemia: Secondary | ICD-10-CM

## 2019-05-12 DIAGNOSIS — N183 Chronic kidney disease, stage 3 unspecified: Secondary | ICD-10-CM

## 2019-05-12 DIAGNOSIS — IMO0002 Reserved for concepts with insufficient information to code with codable children: Secondary | ICD-10-CM

## 2019-05-12 DIAGNOSIS — E782 Mixed hyperlipidemia: Secondary | ICD-10-CM | POA: Diagnosis not present

## 2019-05-12 DIAGNOSIS — Z794 Long term (current) use of insulin: Secondary | ICD-10-CM

## 2019-05-12 LAB — POCT GLYCOSYLATED HEMOGLOBIN (HGB A1C): Hemoglobin A1C: 6.6 % — AB (ref 4.0–5.6)

## 2019-05-12 MED ORDER — LANTUS SOLOSTAR 100 UNIT/ML ~~LOC~~ SOPN: 50.0000 [IU] | PEN_INJECTOR | Freq: Every day | SUBCUTANEOUS | 2 refills | Status: DC

## 2019-05-12 NOTE — Patient Instructions (Signed)

## 2019-05-12 NOTE — Progress Notes (Signed)
Endocrinology Consult Note       05/12/2019, 10:06 AM   Subjective:    Patient ID: Jon Moore, male    DOB: July 22, 1961.  Jon Moore is being seen in follow up for management of currently uncontrolled symptomatic diabetes requested by  [name], DO.   Past Medical History:  Diagnosis Date  . Diabetes mellitus   . Frozen shoulder syndrome 2013   Right shoulder.  Has had PT.  Marland Kitchen Hypercholesterolemia   . Hypertension     Past Surgical History:  Procedure Laterality Date  . APPENDECTOMY    . KNEE SURGERY     right    Social History   Socioeconomic History  . Marital status: Married    Spouse name: Not on file  . Number of children: 1  . Years of education: Not on file  . Highest education level: Not on file  Occupational History  . Occupation: [employer]    Comment: Engineer, building services   Social Needs  . Financial resource strain: Not on file  . Food insecurity    Worry: Not on file    Inability: Not on file  . Transportation needs    Medical: Not on file    Non-medical: Not on file  Tobacco Use  . Smoking status: Never Smoker  . Smokeless tobacco: Never Used  Substance and Sexual Activity  . Alcohol use: No  . Drug use: No  . Sexual activity: Not on file  Lifestyle  . Physical activity    Days per week: Not on file    Minutes per session: Not on file  . Stress: Not on file  Relationships  . Social Herbalist on phone: Not on file    Gets together: Not on file    Attends religious service: Not on file    Active member of club or organization: Not on file    Attends meetings of clubs or organizations: Not on file    Relationship status: Not on file  Other Topics Concern  . Not on file  Social History Narrative  . Not on file    Family History  Problem Relation Age of Onset  . Diabetes Mother   . Heart failure Mother   . Hypertension  Father   . Kidney disease Father   . Cancer Sister   . Hodgkin's lymphoma Sister   . Hypertension Brother   . Hypertension Brother   . Hypertension Brother   . Colon cancer Neg Hx     Outpatient Encounter Medications as of 05/12/2019  Medication Sig  . allopurinol (ZYLOPRIM) 100 MG tablet Take 0.5 tablets (50 mg total) by mouth daily.  Marland Kitchen amLODipine-olmesartan (AZOR) 10-40 MG tablet Take 1 tablet by mouth once daily  . blood glucose meter kit and supplies Dispense based on patient and insurance preference. Use up to two times daily as directed. (FOR ICD-10 E11.65)  . carvedilol (COREG) 25 MG tablet TAKE 1 TABLET BY MOUTH TWICE DAILY WITH A MEAL  . cloNIDine (CATAPRES) 0.1 MG tablet Take 1 tablet by mouth twice daily  . diclofenac sodium (VOLTAREN)  1 % GEL Apply 4 g topically 4 (four) times daily.  . furosemide (LASIX) 80 MG tablet Take 1 tablet (80 mg total) by mouth 2 (two) times daily.  Marland Kitchen glucose blood test strip 1 each by Other route 2 (two) times daily. Use as instructed bid E11.65  . Insulin Glargine (LANTUS SOLOSTAR) 100 UNIT/ML Solostar Pen Inject 50 Units into the skin at bedtime.  . Insulin Pen Needle 29G X 12.7MM MISC Use to inject Lantus once qd  . metFORMIN (GLUCOPHAGE) 1000 MG tablet Take 0.5 tablets (500 mg total) by mouth 2 (two) times daily with a meal.  . mometasone (NASONEX) 50 MCG/ACT nasal spray Use 2 spray(s) in each nostril once daily  . rosuvastatin (CRESTOR) 5 MG tablet Take 1 tablet by mouth once daily  . [DISCONTINUED] Insulin Glargine (LANTUS SOLOSTAR) 100 UNIT/ML Solostar Pen Inject 70 Units into the skin at bedtime.   No facility-administered encounter medications on file as of 05/12/2019.     ALLERGIES: No Known Allergies  VACCINATION STATUS: Immunization History  Administered Date(s) Administered  . Influenza,inj,Quad PF,6+ Mos 07/19/2014, 06/14/2016, 07/15/2017, 07/29/2018  . Influenza-Unspecified 07/19/2013  . Pneumococcal Conjugate-13 04/19/2014   . Pneumococcal Polysaccharide-23 04/29/2018  . Td 05/13/2011  . Tdap 05/13/2011    Diabetes He presents for his follow-up diabetic visit. He has type 2 diabetes mellitus. Onset time: He was diagnosed at approximate age of 55 years. His disease course has been improving. There are no hypoglycemic associated symptoms. Pertinent negatives for hypoglycemia include no confusion, headaches, pallor or seizures. Pertinent negatives for diabetes include no chest pain, no fatigue, no polydipsia, no polyphagia, no polyuria and no weakness. There are no hypoglycemic complications. Symptoms are improving. Diabetic complications include nephropathy. Risk factors for coronary artery disease include dyslipidemia, diabetes mellitus, family history, hypertension, male sex, obesity and sedentary lifestyle. Current diabetic treatment includes insulin injections (He is currently on Lantus 60 units nightly, 110 mg daily, metformin 1000 g p.o. twice daily.). His weight is decreasing steadily. He is following a generally unhealthy diet. When asked about meal planning, he reported none. He has not had a previous visit with a dietitian. He never participates in exercise. His breakfast blood glucose range is generally 90-110 mg/dl. His bedtime blood glucose range is generally 140-180 mg/dl. His overall blood glucose range is 130-140 mg/dl. An ACE inhibitor/angiotensin II receptor blocker is being taken. Eye exam is current (Patient reports cataracts.).  Hyperlipidemia This is a chronic problem. The current episode started more than 1 year ago. The problem is controlled. Exacerbating diseases include chronic renal disease, diabetes and obesity. Pertinent negatives include no chest pain, myalgias or shortness of breath. Current antihyperlipidemic treatment includes statins. Risk factors for coronary artery disease include diabetes mellitus, dyslipidemia, family history, obesity, male sex, hypertension and a sedentary lifestyle.   Hypertension This is a chronic problem. The current episode started more than 1 year ago. The problem is uncontrolled. Pertinent negatives include no chest pain, headaches, neck pain, palpitations or shortness of breath. Risk factors for coronary artery disease include dyslipidemia, diabetes mellitus, male gender, obesity, sedentary lifestyle and family history. Past treatments include angiotensin blockers. Hypertensive end-organ damage includes kidney disease. Identifiable causes of hypertension include chronic renal disease.     Review of Systems  Constitutional: Negative for chills, fatigue, fever and unexpected weight change.  HENT: Negative for dental problem, mouth sores and trouble swallowing.   Eyes: Negative for visual disturbance.  Respiratory: Negative for cough, choking, chest tightness, shortness of breath and  wheezing.   Cardiovascular: Negative for chest pain, palpitations and leg swelling.  Gastrointestinal: Negative for abdominal distention, abdominal pain, constipation, diarrhea, nausea and vomiting.  Endocrine: Negative for polydipsia, polyphagia and polyuria.  Genitourinary: Negative for dysuria, flank pain, hematuria and urgency.  Musculoskeletal: Positive for gait problem. Negative for back pain, myalgias and neck pain.  Skin: Negative for pallor, rash and wound.  Neurological: Negative for seizures, syncope, weakness, numbness and headaches.  Psychiatric/Behavioral: Negative for confusion and dysphoric mood.    Objective:    BP (!) 161/89 (BP Location: Right Arm, Patient Position: Sitting, Cuff Size: Large)   Pulse 71   Temp (!) 97.5 F (36.4 C) (Oral)   Ht 6' (1.829 m)   Wt (!) 378 lb 3.2 oz (171.6 kg)   SpO2 92%   BMI 51.29 kg/m   Wt Readings from Last 3 Encounters:  05/12/19 (!) 378 lb 3.2 oz (171.6 kg)  04/05/19 (!) 389 lb (176.4 kg)  02/01/19 (!) 388 lb (176 kg)     Physical Exam Constitutional:      General: He is not in acute distress.     Appearance: He is well-developed.  HENT:     Head: Normocephalic and atraumatic.  Neck:     Musculoskeletal: Normal range of motion and neck supple.     Thyroid: No thyromegaly.     Trachea: No tracheal deviation.  Cardiovascular:     Rate and Rhythm: Normal rate.     Pulses:          Dorsalis pedis pulses are 1+ on the right side and 1+ on the left side.       Posterior tibial pulses are 1+ on the right side and 1+ on the left side.     Heart sounds: S1 normal and S2 normal. No murmur. No gallop.   Pulmonary:     Effort: Pulmonary effort is normal. No respiratory distress.     Breath sounds: No wheezing.  Abdominal:     General: There is no distension.     Tenderness: There is no abdominal tenderness. There is no guarding.  Musculoskeletal:     Right shoulder: He exhibits no swelling and no deformity.  Skin:    General: Skin is warm and dry.     Findings: No rash.     Nails: There is no clubbing.   Neurological:     Mental Status: He is alert and oriented to person, place, and time.     Cranial Nerves: No cranial nerve deficit.     Sensory: No sensory deficit.     Gait: Gait normal.     Deep Tendon Reflexes: Reflexes are normal and symmetric.  Psychiatric:        Speech: Speech normal.        Behavior: Behavior normal. Behavior is cooperative.        Thought Content: Thought content normal.        Judgment: Judgment normal.     CMP     Component Value Date/Time   NA 142 02/01/2019 0820   K 4.3 02/01/2019 0820   CL 102 02/01/2019 0820   CO2 23 02/01/2019 0820   GLUCOSE 126 (H) 02/01/2019 0820   BUN 25 (H) 02/01/2019 0820   CREATININE 1.44 (H) 02/01/2019 0820   CALCIUM 9.3 02/01/2019 0820   PROT 8.1 07/29/2018 0903   ALBUMIN 4.1 07/29/2018 0903   AST 17 07/29/2018 0903   ALT 17 07/29/2018 0903   ALKPHOS 131 (H) 07/29/2018  0903   BILITOT 0.5 07/29/2018 0903   GFRNONAA 53 (L) 02/01/2019 0820   GFRAA 61 02/01/2019 0820     Diabetic Labs (most recent): Lab  Results  Component Value Date   HGBA1C 6.6 (A) 05/12/2019   HGBA1C 8.0 (H) 02/01/2019   HGBA1C 7.3 (H) 10/28/2018     Lipid Panel ( most recent) Lipid Panel     Component Value Date/Time   CHOL 124 04/29/2018 0916   TRIG 135 04/29/2018 0916   HDL 27 (L) 04/29/2018 0916   CHOLHDL 4.6 04/29/2018 0916   LDLCALC 70 04/29/2018 0916      Lab Results  Component Value Date   TSH 2.320 04/29/2018   TSH 1.530 02/10/2014   TSH 1.780 10/19/2013   TSH 1.650 04/13/2013      Assessment & Plan:   1. Uncontrolled type 2 diabetes mellitus with stage 3 chronic kidney disease (HCC)  - Jon Moore has currently uncontrolled symptomatic type 2 DM since  58 years of age. -His A1c is 6.6%, improving from 8%.  - Recent labs reviewed. - I had a long discussion with him about the progressive nature of diabetes and the pathology behind its complications. -his diabetes is complicated by CKD and he remains at a high risk for more acute and chronic complications which include CAD, CVA, CKD, retinopathy, and neuropathy. These are all discussed in detail with him.  - I have counseled him on diet management and weight loss, by adopting a carbohydrate restricted/protein rich diet.  - he  admits there is a room for improvement in his diet and drink choices. -  Suggestion is made for him to avoid simple carbohydrates  from his diet including Cakes, Sweet Desserts / Pastries, Ice Cream, Soda (diet and regular), Sweet Tea, Candies, Chips, Cookies, Sweet Pastries,  Store Bought Juices, Alcohol in Excess of  1-2 drinks a day, Artificial Sweeteners, Coffee Creamer, and "Sugar-free" Products. This will help patient to have stable blood glucose profile and potentially avoid unintended weight gain.   - I encouraged him to switch to  unprocessed or minimally processed complex starch and increased protein intake (animal or plant source), fruits, and vegetables.  - he is advised to stick to a routine mealtimes to  eat 3 meals  a day and avoid unnecessary snacks ( to snack only to correct hypoglycemia).   - he will be scheduled with Jearld Fenton, RDN, CDE for diabetes education.  - I have approached him with the following individualized plan to manage  his diabetes and patient agrees:   - he will continue to need insulin treatment in order for him to  maintain control of diabetes to target.  -He is advised to lower his Lantus to 50 units daily at bedtime, and  associated with strict monitoring of glucose 2 times a day-before breakfast and at bedtime. - he is warned not to take insulin without proper monitoring per orders.  - he is encouraged to call clinic for blood glucose levels less than 70 or above 300 mg /dl. - he is advised to lower metformin to 500 mg p.o. twice daily , because of his CKD.    - he will be considered for incretin therapy as appropriate next visit.  - Patient specific target  A1c;  LDL, HDL, Triglycerides, and  Waist Circumference were discussed in detail.  2) Blood Pressure /Hypertension:  his blood pressure is uncontrolled.    he is advised to continue his current medications including clonidine 0.5 mg  p.o. twice daily, carvedilol 25 mg p.o. twice daily, amlodipine/olmesartan 10/40 mg p.o. daily with breakfast .  3) Lipids/Hyperlipidemia:   Review of his recent lipid panel showed controlled  LDL at 70 .  he  is advised to continue Crestor 5 mg p.o. daily at bedtime. Side effects and precautions discussed with him.  4)  Weight/Diet:  Body mass index is 51.29 kg/m.  -   clearly complicating his diabetes care.  I discussed with him the fact that loss of 5 - 10% of his  current body weight will have the most impact on his diabetes management.  CDE Consult will be initiated . Exercise, and detailed carbohydrates information provided  -  detailed on discharge instructions.  5) Chronic Care/Health Maintenance:  -he  is on ACEI/ARB and Statin medications and  is encouraged to  initiate and continue to follow up with Ophthalmology, Dentist,  Podiatrist at least yearly or according to recommendations, and advised to  stay away from smoking. I have recommended yearly flu vaccine and pneumonia vaccine at least every 5 years; moderate intensity exercise for up to 150 minutes weekly; and  sleep for at least 7 hours a day.  - he is  advised to maintain close follow up with [name], DO for primary care needs, as well as his other providers for optimal and coordinated care. - Patient Care Time Today:  25 min, of which >50% was spent in  counseling and the rest reviewing his  current and  previous labs/studies, previous treatments, his blood glucose readings, and medications' doses and developing a plan for long-term care based on the latest recommendations for standards of care.   Jon Moore participated in the discussions, expressed understanding, and voiced agreement with the above plans.  All questions were answered to his satisfaction. he is encouraged to contact clinic should he have any questions or concerns prior to his return visit.   Follow up plan: - Return in about 4 months (around 09/11/2019) for Bring Meter and Logs- A1c in Office.  Glade Lloyd, MD [facility] [address] [place], [place] [address] Phone: [phone]  Fax: [phone]    05/12/2019, 10:06 AM  This note was partially dictated with voice recognition software. Similar sounding words can be transcribed inadequately or may not  be corrected upon review.

## 2019-05-26 ENCOUNTER — Ambulatory Visit: Payer: BC Managed Care – PPO | Admitting: Family Medicine

## 2019-05-26 ENCOUNTER — Encounter: Payer: Self-pay | Admitting: Family Medicine

## 2019-05-26 ENCOUNTER — Other Ambulatory Visit: Payer: Self-pay

## 2019-05-26 VITALS — BP 119/68 | HR 59 | Temp 98.1°F | Ht 72.0 in | Wt 377.0 lb

## 2019-05-26 DIAGNOSIS — E1159 Type 2 diabetes mellitus with other circulatory complications: Secondary | ICD-10-CM | POA: Diagnosis not present

## 2019-05-26 DIAGNOSIS — Z23 Encounter for immunization: Secondary | ICD-10-CM

## 2019-05-26 DIAGNOSIS — I152 Hypertension secondary to endocrine disorders: Secondary | ICD-10-CM

## 2019-05-26 DIAGNOSIS — N1831 Chronic kidney disease, stage 3a: Secondary | ICD-10-CM | POA: Diagnosis not present

## 2019-05-26 DIAGNOSIS — E1169 Type 2 diabetes mellitus with other specified complication: Secondary | ICD-10-CM | POA: Diagnosis not present

## 2019-05-26 DIAGNOSIS — I10 Essential (primary) hypertension: Secondary | ICD-10-CM | POA: Diagnosis not present

## 2019-05-26 DIAGNOSIS — E1121 Type 2 diabetes mellitus with diabetic nephropathy: Secondary | ICD-10-CM

## 2019-05-26 DIAGNOSIS — Z794 Long term (current) use of insulin: Secondary | ICD-10-CM

## 2019-05-26 DIAGNOSIS — E785 Hyperlipidemia, unspecified: Secondary | ICD-10-CM

## 2019-05-26 NOTE — Progress Notes (Signed)
Subjective: CC: DM2, HTN, HLD PCP: [name], DO WLN:[id number] Jon Moore is a 58 y.o. male presenting to clinic today for:  1. Type 2 Diabetes w/ HTN and HLD Patient reports: Glucometer: One Touch ultra. Taking medication(s): Metformin 500p.o. twice daily, Lantus 50 units daily, Lasix BID, Coreg BID, Clonidine BID, Crestor qhs.  He continues to follow with [name].  Last appt 9/23. Last eye exam: December 2019, no retinopathy Last foot exam: 07/2018 Last A1c:  Lab Results  Component Value Date   HGBA1C 6.6 (A) 05/12/2019  Nephropathy screen indicated?:  On an ARB Last flu, zoster and/or pneumovax: UTD Immunization History  Administered Date(s) Administered  . Influenza,inj,Quad PF,6+ Mos 07/19/2014, 06/14/2016, 07/15/2017, 07/29/2018  . Influenza-Unspecified 07/19/2013  . Pneumococcal Conjugate-13 04/19/2014  . Pneumococcal Polysaccharide-23 04/29/2018  . Td 05/13/2011  . Tdap 05/13/2011   ROS: No dizziness, visual disturbance, numbness tingling, chest pain or shortness of breath.   No Known Allergies Past Medical History:  Diagnosis Date  . Diabetes mellitus   . Frozen shoulder syndrome 2013   Right shoulder.  Has had PT.  Marland Kitchen Hypercholesterolemia   . Hypertension     Current Outpatient Medications:  .  allopurinol (ZYLOPRIM) 100 MG tablet, Take 0.5 tablets (50 mg total) by mouth daily., Disp: 45 tablet, Rfl: 2 .  amLODipine-olmesartan (AZOR) 10-40 MG tablet, Take 1 tablet by mouth once daily, Disp: 90 tablet, Rfl: 0 .  blood glucose meter kit and supplies, Dispense based on patient and insurance preference. Use up to two times daily as directed. (FOR ICD-10 E11.65), Disp: 1 each, Rfl: 5 .  carvedilol (COREG) 25 MG tablet, TAKE 1 TABLET BY MOUTH TWICE DAILY WITH A MEAL, Disp: 180 tablet, Rfl: 0 .  cloNIDine (CATAPRES) 0.1 MG tablet, Take 1 tablet by mouth twice daily, Disp: 180 tablet, Rfl: 0 .  diclofenac sodium (VOLTAREN) 1 % GEL, Apply 4 g topically 4 (four) times  daily., Disp: , Rfl:  .  furosemide (LASIX) 80 MG tablet, Take 1 tablet (80 mg total) by mouth 2 (two) times daily., Disp: 180 tablet, Rfl: 3 .  glucose blood test strip, 1 each by Other route 2 (two) times daily. Use as instructed bid E11.65, Disp: 100 each, Rfl: 5 .  Insulin Glargine (LANTUS SOLOSTAR) 100 UNIT/ML Solostar Pen, Inject 50 Units into the skin at bedtime., Disp: 10 pen, Rfl: 2 .  Insulin Pen Needle 29G X 12.7MM MISC, Use to inject Lantus once qd, Disp: 100 each, Rfl: 3 .  metFORMIN (GLUCOPHAGE) 1000 MG tablet, Take 0.5 tablets (500 mg total) by mouth 2 (two) times daily with a meal., Disp: 180 tablet, Rfl: 2 .  mometasone (NASONEX) 50 MCG/ACT nasal spray, Use 2 spray(s) in each nostril once daily, Disp: 51 g, Rfl: 0 .  rosuvastatin (CRESTOR) 5 MG tablet, Take 1 tablet by mouth once daily, Disp: 90 tablet, Rfl: 0 Social History   Socioeconomic History  . Marital status: Married    Spouse name: Not on file  . Number of children: 1  . Years of education: Not on file  . Highest education level: Not on file  Occupational History  . Occupation: Jon Moore    Comment: Engineer, building services   Social Needs  . Financial resource strain: Not on file  . Food insecurity    Worry: Not on file    Inability: Not on file  . Transportation needs    Medical: Not on file    Non-medical: Not  on file  Tobacco Use  . Smoking status: Never Smoker  . Smokeless tobacco: Never Used  Substance and Sexual Activity  . Alcohol use: No  . Drug use: No  . Sexual activity: Not on file  Lifestyle  . Physical activity    Days per week: Not on file    Minutes per session: Not on file  . Stress: Not on file  Relationships  . Social Herbalist on phone: Not on file    Gets together: Not on file    Attends religious service: Not on file    Active member of club or organization: Not on file    Attends meetings of clubs or organizations: Not on file    Relationship status: Not on  file  . Intimate partner violence    Fear of current or ex partner: Not on file    Emotionally abused: Not on file    Physically abused: Not on file    Forced sexual activity: Not on file  Other Topics Concern  . Not on file  Social History Narrative  . Not on file   Family History  Problem Relation Age of Onset  . Diabetes Mother   . Heart failure Mother   . Hypertension Father   . Kidney disease Father   . Cancer Sister   . Hodgkin's lymphoma Sister   . Hypertension Brother   . Hypertension Brother   . Hypertension Brother   . Colon cancer Neg Hx     Objective: Office vital signs reviewed. BP 119/68   Pulse (!) 59   Temp 98.1 F (36.7 C) (Temporal)   Ht 6' (1.829 m)   Wt (!) 377 lb (171 kg)   SpO2 97%   BMI 51.13 kg/m   Physical Examination:  General: Awake, alert, obese. No acute distress HEENT: Normal, sclera white, MMM Cardio: regular rate and rhythm, S1S2 heard, no murmurs appreciated Pulm: clear to auscultation bilaterally, no wheezes, rhonchi or rales; normal work of breathing on room air Extremities: warm, well perfused, trace edema of bilateral LE to shins.    Diabetic Foot Exam - Simple   Simple Foot Form Diabetic Foot exam was performed with the following findings: Yes 05/26/2019  8:27 AM  Visual Inspection No deformities, no ulcerations, no other skin breakdown bilaterally: Yes Sensation Testing Intact to touch and monofilament testing bilaterally: Yes Pulse Check See comments: Yes Comments +1 pedal pulses.  He has a palpable knot on the left posterior heel.  Followed by Dr. Irving Shows for nail care.  Onychomycotic changes noted to bilateral feet and nails.    Assessment/ Plan: 58 y.o. male   1. Hypertension associated with diabetes ([place]) Controlled.  Continue current regimen. - CMP14+EGFR  2. Type 2 diabetes mellitus with stage 3a chronic kidney disease, with long-term current use of insulin (HCC) Under excellent control with A1c of 6.6 at last  endocrinology visit.  Continue current regimen.  DM foot exam performed today.  Flu shot administered. - CMP14+EGFR - VITAMIN D 25 Hydroxy (Vit-D Deficiency, Fractures) - CBC  3. Hyperlipidemia associated with type 2 diabetes mellitus (HCC) Continue statin - CMP14+EGFR - Lipid Panel  4. Morbid obesity ([place]) Working on weight loss and lifestyle modification.   No orders of the defined types were placed in this encounter.  No orders of the defined types were placed in this encounter.   [name], DO [place] [phone]

## 2019-05-26 NOTE — Patient Instructions (Signed)

## 2019-05-27 LAB — LIPID PANEL
Chol/HDL Ratio: 4.2 ratio (ref 0.0–5.0)
Cholesterol, Total: 121 mg/dL (ref 100–199)
HDL: 29 mg/dL — ABNORMAL LOW (ref >39)
LDL Chol Calc (NIH): 65 mg/dL (ref 0–99)
Triglycerides: 156 mg/dL — ABNORMAL HIGH (ref 0–149)
VLDL Cholesterol Cal: 27 mg/dL (ref 5–40)

## 2019-05-27 LAB — CMP14+EGFR
ALT: 33 IU/L (ref 0–44)
AST: 24 IU/L (ref 0–40)
Albumin/Globulin Ratio: 1.1 — ABNORMAL LOW (ref 1.2–2.2)
Albumin: 4 g/dL (ref 3.8–4.9)
Alkaline Phosphatase: 125 IU/L — ABNORMAL HIGH (ref 39–117)
BUN/Creatinine Ratio: 12 (ref 9–20)
BUN: 18 mg/dL (ref 6–24)
Bilirubin Total: 0.5 mg/dL (ref 0.0–1.2)
CO2: 26 mmol/L (ref 20–29)
Calcium: 9.6 mg/dL (ref 8.7–10.2)
Chloride: 101 mmol/L (ref 96–106)
Creatinine, Ser: 1.49 mg/dL — ABNORMAL HIGH (ref 0.76–1.27)
GFR calc Af Amer: 59 mL/min/{1.73_m2} — ABNORMAL LOW (ref >59)
GFR calc non Af Amer: 51 mL/min/{1.73_m2} — ABNORMAL LOW (ref >59)
Globulin, Total: 3.5 g/dL (ref 1.5–4.5)
Glucose: 85 mg/dL (ref 65–99)
Potassium: 4.2 mmol/L (ref 3.5–5.2)
Sodium: 140 mmol/L (ref 134–144)
Total Protein: 7.5 g/dL (ref 6.0–8.5)

## 2019-05-27 LAB — CBC
Hematocrit: 42.6 % (ref 37.5–51.0)
Hemoglobin: 14.3 g/dL (ref 13.0–17.7)
MCH: 26.9 pg (ref 26.6–33.0)
MCHC: 33.6 g/dL (ref 31.5–35.7)
MCV: 80 fL (ref 79–97)
Platelets: 255 10*3/uL (ref 150–450)
RBC: 5.32 x10E6/uL (ref 4.14–5.80)
RDW: 14.4 % (ref 11.6–15.4)
WBC: 6.5 10*3/uL (ref 3.4–10.8)

## 2019-05-27 LAB — VITAMIN D 25 HYDROXY (VIT D DEFICIENCY, FRACTURES): Vit D, 25-Hydroxy: 20.7 ng/mL — ABNORMAL LOW (ref 30.0–100.0)

## 2019-05-28 ENCOUNTER — Other Ambulatory Visit: Payer: Self-pay | Admitting: Family Medicine

## 2019-05-28 DIAGNOSIS — E559 Vitamin D deficiency, unspecified: Secondary | ICD-10-CM

## 2019-05-28 MED ORDER — ERGOCALCIFEROL 1.25 MG (50000 UT) PO CAPS: 50000.0000 [IU] | ORAL_CAPSULE | ORAL | 0 refills | Status: AC

## 2019-06-03 DIAGNOSIS — M79676 Pain in unspecified toe(s): Secondary | ICD-10-CM | POA: Diagnosis not present

## 2019-06-03 DIAGNOSIS — E1142 Type 2 diabetes mellitus with diabetic polyneuropathy: Secondary | ICD-10-CM | POA: Diagnosis not present

## 2019-06-03 DIAGNOSIS — B351 Tinea unguium: Secondary | ICD-10-CM | POA: Diagnosis not present

## 2019-06-08 ENCOUNTER — Other Ambulatory Visit: Payer: Self-pay | Admitting: Family Medicine

## 2019-06-24 ENCOUNTER — Other Ambulatory Visit: Payer: Self-pay | Admitting: "Endocrinology

## 2019-06-24 DIAGNOSIS — N183 Chronic kidney disease, stage 3 unspecified: Secondary | ICD-10-CM

## 2019-06-24 DIAGNOSIS — E1122 Type 2 diabetes mellitus with diabetic chronic kidney disease: Secondary | ICD-10-CM

## 2019-07-06 ENCOUNTER — Other Ambulatory Visit: Payer: Self-pay | Admitting: Family Medicine

## 2019-07-08 ENCOUNTER — Encounter: Payer: BC Managed Care – PPO | Attending: "Endocrinology | Admitting: Nutrition

## 2019-07-08 ENCOUNTER — Other Ambulatory Visit: Payer: Self-pay

## 2019-07-08 ENCOUNTER — Encounter: Payer: Self-pay | Admitting: Nutrition

## 2019-07-08 VITALS — Ht 73.0 in | Wt 368.0 lb

## 2019-07-08 DIAGNOSIS — E1122 Type 2 diabetes mellitus with diabetic chronic kidney disease: Secondary | ICD-10-CM | POA: Insufficient documentation

## 2019-07-08 DIAGNOSIS — E1165 Type 2 diabetes mellitus with hyperglycemia: Secondary | ICD-10-CM | POA: Insufficient documentation

## 2019-07-08 DIAGNOSIS — I10 Essential (primary) hypertension: Secondary | ICD-10-CM | POA: Diagnosis not present

## 2019-07-08 DIAGNOSIS — N183 Chronic kidney disease, stage 3 unspecified: Secondary | ICD-10-CM

## 2019-07-08 DIAGNOSIS — E782 Mixed hyperlipidemia: Secondary | ICD-10-CM | POA: Diagnosis not present

## 2019-07-08 DIAGNOSIS — IMO0002 Reserved for concepts with insufficient information to code with codable children: Secondary | ICD-10-CM

## 2019-07-08 NOTE — Progress Notes (Signed)
  Medical Nutrition Therapy:  Appt start time: 0800 end time:  0900.   Assessment:  Primary concerns today: Diabetes Type 2, OBesity . Sees Dr. Dorris Fetch FBS: 85-100 and night 130-150. Changes made: cut out sweets, 9 lbs lost. Feel better. CMP Latest Ref Rng & Units 05/26/2019 02/01/2019 10/28/2018  Glucose 65 - 99 mg/dL 85 126(H) 78  BUN 6 - 24 mg/dL 18 25(H) 22  Creatinine 0.76 - 1.27 mg/dL 1.49(H) 1.44(H) 1.43(H)  Sodium 134 - 144 mmol/L 140 142 143  Potassium 3.5 - 5.2 mmol/L 4.2 4.3 4.4  Chloride 96 - 106 mmol/L 101 102 102  CO2 20 - 29 mmol/L 26 23 27   Calcium 8.7 - 10.2 mg/dL 9.6 9.3 9.5  Total Protein 6.0 - 8.5 g/dL 7.5 - -  Total Bilirubin 0.0 - 1.2 mg/dL 0.5 - -  Alkaline Phos 39 - 117 IU/L 125(H) - -  AST 0 - 40 IU/L 24 - -  ALT 0 - 44 IU/L 33 - -   Lab Results  Component Value Date   HGBA1C 6.6 (A) 05/12/2019   Lipid Panel     Component Value Date/Time   CHOL 121 05/26/2019 0836   TRIG 156 (H) 05/26/2019 0836   HDL 29 (L) 05/26/2019 0836   CHOLHDL 4.2 05/26/2019 0836   LDLCALC 65 05/26/2019 0836   LABVLDL 27 05/26/2019 0836     No Preferred Learning Style:        Learning Readiness:  Ready  Change in progress   MEDICATIONS:    DIETARY INTAKE:  24-hr recall:  B ( AM): HOney nut cherrios, banana and  Snk ( AM):  L ( PM): apple,  Nabs,crackers, diet sodas, Snk ( PM):  D ( PM): Chicken mashed poatoes, green beans, water, diet soda Snk ( PM):  Beverages: water, diet soda.  Usual physical activity:  Estimated energy needs: 1800-2000  calories 200 g carbohydrates 135 g protein 50 g fat  Progress Towards Goal(s):  In progress.   Nutritional Diagnosis:  NB-1.1 Food and nutrition-related knowledge deficit As related to Diabetes and Obesity.  As evidenced by A1C 6.6% and BMI 48.    Intervention: Nutrition and Diabetes education provided on My Plate, CHO counting, meal planning, portion sizes, timing of meals, avoiding snacks between meals unless  having a low blood sugar, target ranges for A1C and blood sugars, signs/symptoms and treatment of hyper/hypoglycemia, monitoring blood sugars, taking medications as prescribed, benefits of exercising 30 minutes per day and prevention of complications of DM. Marland Kitchen Goals  Follow My Plate Eat 60 g CHO at meals Increase fresh fruits and vegetables  Drink only water-gallon a day  Exercise 30-60 minutes a day when you can Avoid snacks between meals Lose 1-2 lbs per week Get A1C 5.9% or less.   Choose high fiber lower fat foods.Teaching Method Utilized:  Visual Auditory Hands on  Handouts given during visit include:  The Plate Method  Meal Plan Card  Diabetes Instructions.   Barriers to learning/adherence to lifestyle change: none  Demonstrated degree of understanding via:  Teach Back   Monitoring/Evaluation:  Dietary intake, exercise, , and body weight in 3 month(s).

## 2019-07-08 NOTE — Patient Instructions (Signed)
Goals  Follow My Plate Eat 60 g CHO at meals Increase fresh fruits and vegetables  Drink only water-gallon a day  Exercise 30-60 minutes a day when you can Avoid snacks between meals Lose 1-2 lbs per week Get A1C 5.9% or less. Choose high fiber lower fat foods.

## 2019-07-25 ENCOUNTER — Other Ambulatory Visit: Payer: Self-pay | Admitting: "Endocrinology

## 2019-07-25 DIAGNOSIS — E1122 Type 2 diabetes mellitus with diabetic chronic kidney disease: Secondary | ICD-10-CM

## 2019-08-18 ENCOUNTER — Other Ambulatory Visit: Payer: Self-pay | Admitting: Family Medicine

## 2019-08-19 DIAGNOSIS — E1142 Type 2 diabetes mellitus with diabetic polyneuropathy: Secondary | ICD-10-CM | POA: Diagnosis not present

## 2019-08-19 DIAGNOSIS — M79676 Pain in unspecified toe(s): Secondary | ICD-10-CM | POA: Diagnosis not present

## 2019-08-19 DIAGNOSIS — B351 Tinea unguium: Secondary | ICD-10-CM | POA: Diagnosis not present

## 2019-09-06 ENCOUNTER — Other Ambulatory Visit: Payer: Self-pay | Admitting: Family Medicine

## 2019-09-06 DIAGNOSIS — M1039 Gout due to renal impairment, multiple sites: Secondary | ICD-10-CM

## 2019-09-06 NOTE — Telephone Encounter (Signed)
OV 09/27/19 

## 2019-09-14 ENCOUNTER — Encounter: Payer: Self-pay | Admitting: "Endocrinology

## 2019-09-14 ENCOUNTER — Ambulatory Visit: Payer: BC Managed Care – PPO | Admitting: "Endocrinology

## 2019-09-14 ENCOUNTER — Other Ambulatory Visit: Payer: Self-pay

## 2019-09-14 ENCOUNTER — Encounter: Payer: BC Managed Care – PPO | Attending: "Endocrinology | Admitting: Nutrition

## 2019-09-14 ENCOUNTER — Encounter: Payer: Self-pay | Admitting: Nutrition

## 2019-09-14 VITALS — BP 149/86 | HR 79 | Ht 72.0 in | Wt 373.6 lb

## 2019-09-14 DIAGNOSIS — E1165 Type 2 diabetes mellitus with hyperglycemia: Secondary | ICD-10-CM | POA: Diagnosis not present

## 2019-09-14 DIAGNOSIS — N183 Chronic kidney disease, stage 3 unspecified: Secondary | ICD-10-CM | POA: Diagnosis not present

## 2019-09-14 DIAGNOSIS — Z794 Long term (current) use of insulin: Secondary | ICD-10-CM | POA: Diagnosis not present

## 2019-09-14 DIAGNOSIS — E1122 Type 2 diabetes mellitus with diabetic chronic kidney disease: Secondary | ICD-10-CM

## 2019-09-14 DIAGNOSIS — E559 Vitamin D deficiency, unspecified: Secondary | ICD-10-CM | POA: Diagnosis not present

## 2019-09-14 DIAGNOSIS — E782 Mixed hyperlipidemia: Secondary | ICD-10-CM | POA: Diagnosis not present

## 2019-09-14 DIAGNOSIS — I10 Essential (primary) hypertension: Secondary | ICD-10-CM

## 2019-09-14 DIAGNOSIS — IMO0002 Reserved for concepts with insufficient information to code with codable children: Secondary | ICD-10-CM

## 2019-09-14 LAB — POCT GLYCOSYLATED HEMOGLOBIN (HGB A1C): Hemoglobin A1C: 7.7 % — AB (ref 4.0–5.6)

## 2019-09-14 MED ORDER — VITAMIN D3 125 MCG (5000 UT) PO CAPS: 5000.0000 [IU] | ORAL_CAPSULE | Freq: Every day | ORAL | 0 refills | Status: DC

## 2019-09-14 NOTE — Patient Instructions (Signed)
                                     Advice for Weight Management  -For most of us the best way to lose weight is by diet management. Generally speaking, diet management means consuming less calories intentionally which over time brings about progressive weight loss.  This can be achieved more effectively by restricting carbohydrate consumption to the minimum possible.  So, it is critically important to know your numbers: how much calorie you are consuming and how much calorie you need. More importantly, our carbohydrates sources should be unprocessed or minimally processed complex starch food items.   Sometimes, it is important to balance nutrition by increasing protein intake (animal or plant source), fruits, and vegetables.  -Sticking to a routine mealtime to eat 3 meals a day and avoiding unnecessary snacks is shown to have a big role in weight control. Under normal circumstances, the only time we lose real weight is when we are hungry, so allow hunger to take place- hunger means no food between meal times, only water.  It is not advisable to starve.   -It is better to avoid simple carbohydrates including: Cakes, Sweet Desserts, Ice Cream, Soda (diet and regular), Sweet Tea, Candies, Chips, Cookies, Store Bought Juices, Alcohol in Excess of  1-2 drinks a day, Artificial Sweeteners, Doughnuts, Coffee Creamers, "Sugar-free" Products, etc, etc.  This is not a complete list.....    -Consulting with certified diabetes educators is proven to provide you with the most accurate and current information on diet.  Also, you may be  interested in discussing diet options/exchanges , we can schedule a visit with Penny Crumpton, RDN, CDE for individualized nutrition education.  -Exercise: If you are able: 30 -60 minutes a day ,4 days a week, or 150 minutes a week.  The longer the better.  Combine stretch, strength, and aerobic activities.  If you were told in the past that you  have high risk for cardiovascular diseases, you may seek evaluation by your heart doctor prior to initiating moderate to intense exercise programs.                                  Additional Care Considerations for Diabetes   -Diabetes  is a chronic disease.  The most important care consideration is regular follow-up with your diabetes care provider with the goal being avoiding or delaying its complications and to take advantage of advances in medications and technology.    -Type 2 diabetes is known to coexist with other important comorbidities such as high blood pressure and high cholesterol.  It is critical to control not only the diabetes but also the high blood pressure and high cholesterol to minimize and delay the risk of complications including coronary artery disease, stroke, amputations, blindness, etc.    - Studies showed that people with diabetes will benefit from a class of medications known as ACE inhibitors and statins.  Unless there are specific reasons not to be on these medications, the standard of care is to consider getting one from these groups of medications at an optimal doses.  These medications are generally considered safe and proven to help protect the heart and the kidneys.    - People with diabetes are encouraged to initiate and maintain regular follow-up with eye doctors, foot doctors, dentists ,   and if necessary heart and kidney doctors.     - It is highly recommended that people with diabetes quit smoking or stay away from smoking, and get yearly  flu vaccine and pneumonia vaccine at least every 5 years.  One other important lifestyle recommendation is to ensure adequate sleep - at least 6-7 hours of uninterrupted sleep at night.  -Exercise: If you are able: 30 -60 minutes a day, 4 days a week, or 150 minutes a week.  The longer the better.  Combine stretch, strength, and aerobic activities.  If you were told in the past that you have high risk for cardiovascular  diseases, you may seek evaluation by your heart doctor prior to initiating moderate to intense exercise programs.     COVID-19 Vaccine Information can be found at: [email or web address] For questions related to vaccine distribution or appointments, please email [email or web address] or call [phone].        

## 2019-09-14 NOTE — Progress Notes (Signed)
Diabetes Self-Management Education  Visit Type:    Follow up  Appt. Start Time:830 Appt. End Time: 0845  09/14/2019  [name], identified by name and date of birth, is a 59 y.o. male with a diagnosis of Diabetes:  .   FBS 90-120's Bedtime 140-150's.  Eating better. Cut out ice cream and sodas.  Willing to work on exercise to lose more weight. Lost 5 lbs.  Metformin 500 mg BID and 50 units of Lantus a day. Eating better balanced meals. A1C up to 7.7% but over all BS are much better . Making excellent progress. Metformi 500 mg BID. Lanuts 50 units at night. ASSESSMENT  Weight (!) 373 lb (169.2 kg). Body mass index is 50.59 kg/m.     CMP Latest Ref Rng & Units 05/26/2019 02/01/2019 10/28/2018  Glucose 65 - 99 mg/dL 85 962(E) 78  BUN 6 - 24 mg/dL 18 36(O) 22  Creatinine 0.76 - 1.27 mg/dL 2.94(T) 6.54(Y) 5.03(T)  Sodium 134 - 144 mmol/L 140 142 143  Potassium 3.5 - 5.2 mmol/L 4.2 4.3 4.4  Chloride 96 - 106 mmol/L 101 102 102  CO2 20 - 29 mmol/L 26 23 27   Calcium 8.7 - 10.2 mg/dL 9.6 9.3 9.5  Total Protein 6.0 - 8.5 g/dL 7.5 - -  Total Bilirubin 0.0 - 1.2 mg/dL 0.5 - -  Alkaline Phos 39 - 117 IU/L 125(H) - -  AST 0 - 40 IU/L 24 - -  ALT 0 - 44 IU/L 33 - -   Lab Results  Component Value Date   HGBA1C 7.7 (A) 09/14/2019     Learning Objective:  Patient will have a greater understanding of diabetes self-management. Patient education plan is to attend individual and/or group sessions per assessed needs and concerns.   Plan:   Patient Instructions  Goals  Keep up the great job! Walk 10-15 minutes on the treadmill 3 times per week Continue to eat meals on time Keep drinking water Get A1C down to 6.5% Lose 3 lbs per month.     Expected Outcomes:    Weight loss and improved DM management.   Education material provided: A1C conversion sheet, Meal plan card, My Plate, Carbohydrate counting sheet and No sodium seasonings  If problems or questions, patient to  contact team via:  Phone and Email  Future DSME appointment: -

## 2019-09-14 NOTE — Patient Instructions (Signed)
Goals  Keep up the great job! Walk 10-15 minutes on the treadmill 3 times per week Continue to eat meals on time Keep drinking water Get A1C down to 6.5% Lose 3 lbs per month.

## 2019-09-14 NOTE — Progress Notes (Signed)
09/14/2019, 8:59 AM  Endocrinology follow-up note   Subjective:    Patient ID: Jon Moore, male    DOB: February 12, 1961.  Jon Moore is being seen in follow up for management of currently uncontrolled symptomatic diabetes requested by  [name], DO.   Past Medical History:  Diagnosis Date  . Diabetes mellitus   . Frozen shoulder syndrome 2013   Right shoulder.  Has had PT.  Marland Kitchen Hypercholesterolemia   . Hypertension     Past Surgical History:  Procedure Laterality Date  . APPENDECTOMY    . KNEE SURGERY     right    Social History   Socioeconomic History  . Marital status: Married    Spouse name: Not on file  . Number of children: 1  . Years of education: Not on file  . Highest education level: Not on file  Occupational History  . Occupation: [employer]    Comment: Engineer, building services   Tobacco Use  . Smoking status: Never Smoker  . Smokeless tobacco: Never Used  Substance and Sexual Activity  . Alcohol use: No  . Drug use: No  . Sexual activity: Not on file  Other Topics Concern  . Not on file  Social History Narrative  . Not on file   Social Determinants of Health   Financial Resource Strain:   . Difficulty of Paying Living Expenses: Not on file  Food Insecurity:   . Worried About Charity fundraiser in the Last Year: Not on file  . Ran Out of Food in the Last Year: Not on file  Transportation Needs:   . Lack of Transportation (Medical): Not on file  . Lack of Transportation (Non-Medical): Not on file  Physical Activity:   . Days of Exercise per Week: Not on file  . Minutes of Exercise per Session: Not on file  Stress:   . Feeling of Stress : Not on file  Social Connections:   . Frequency of Communication with Friends and Family: Not on file  . Frequency of Social Gatherings with Friends and Family: Not on file  . Attends Religious Services: Not  on file  . Active Member of Clubs or Organizations: Not on file  . Attends Archivist Meetings: Not on file  . Marital Status: Not on file    Family History  Problem Relation Age of Onset  . Diabetes Mother   . Heart failure Mother   . Hypertension Father   . Kidney disease Father   . Cancer Sister   . Hodgkin's lymphoma Sister   . Hypertension Brother   . Hypertension Brother   . Hypertension Brother   . Colon cancer Neg Hx     Outpatient Encounter Medications as of 09/14/2019  Medication Sig  . allopurinol (ZYLOPRIM) 100 MG tablet Take 1/2 (one-half) tablet by mouth once daily  . amLODipine-olmesartan (AZOR) 10-40 MG tablet Take 1 tablet by mouth once daily  . blood glucose meter kit and supplies Dispense based on patient and insurance preference. Use up to two times daily as directed. (FOR ICD-10 E11.65)  .  carvedilol (COREG) 25 MG tablet TAKE 1 TABLET BY MOUTH TWICE DAILY WITH A MEAL  . Cholecalciferol (VITAMIN D3) 125 MCG (5000 UT) CAPS Take 1 capsule (5,000 Units total) by mouth daily.  . cloNIDine (CATAPRES) 0.1 MG tablet Take 1 tablet by mouth twice daily  . diclofenac sodium (VOLTAREN) 1 % GEL Apply 4 g topically 4 (four) times daily.  . furosemide (LASIX) 80 MG tablet Take 1 tablet (80 mg total) by mouth 2 (two) times daily.  Marland Kitchen glucose blood test strip 1 each by Other route 2 (two) times daily. Use as instructed bid E11.65  . Insulin Pen Needle 29G X 12.7MM MISC Use to inject Lantus once qd  . LANTUS SOLOSTAR 100 UNIT/ML Solostar Pen INJECT 50 UNITS SUBCUTANEOUSLY AT BEDTIME  . metFORMIN (GLUCOPHAGE) 1000 MG tablet Take 0.5 tablets (500 mg total) by mouth 2 (two) times daily with a meal.  . mometasone (NASONEX) 50 MCG/ACT nasal spray Use 2 spray(s) in each nostril once daily  . rosuvastatin (CRESTOR) 5 MG tablet Take 1 tablet by mouth once daily   No facility-administered encounter medications on file as of 09/14/2019.    ALLERGIES: No Known  Allergies  VACCINATION STATUS: Immunization History  Administered Date(s) Administered  . Influenza,inj,Quad PF,6+ Mos 07/19/2014, 06/14/2016, 07/15/2017, 07/29/2018, 05/26/2019  . Influenza-Unspecified 07/19/2013  . Pneumococcal Conjugate-13 04/19/2014  . Pneumococcal Polysaccharide-23 04/29/2018  . Td 05/13/2011  . Tdap 05/13/2011    Diabetes He presents for his follow-up diabetic visit. He has type 2 diabetes mellitus. Onset time: He was diagnosed at approximate age of 59 years. His disease course has been worsening. There are no hypoglycemic associated symptoms. Pertinent negatives for hypoglycemia include no confusion, headaches, pallor or seizures. Pertinent negatives for diabetes include no chest pain, no fatigue, no polydipsia, no polyphagia, no polyuria and no weakness. There are no hypoglycemic complications. Symptoms are worsening. Diabetic complications include nephropathy. Risk factors for coronary artery disease include dyslipidemia, diabetes mellitus, family history, hypertension, male sex, obesity and sedentary lifestyle. Current diabetic treatment includes insulin injections (He is currently on Lantus 60 units nightly, Metformin 500 mg p.o. twice daily. ). His weight is increasing steadily. He is following a generally unhealthy diet. When asked about meal planning, he reported none. He has not had a previous visit with a dietitian. He never participates in exercise. His home blood glucose trend is decreasing steadily. His breakfast blood glucose range is generally 110-130 mg/dl. His bedtime blood glucose range is generally 140-180 mg/dl. His overall blood glucose range is 140-180 mg/dl. (His point-of-care A1c today is 7.7%, improving.) An ACE inhibitor/angiotensin II receptor blocker is being taken. Eye exam is current (Patient reports cataracts.).  Hyperlipidemia This is a chronic problem. The current episode started more than 1 year ago. The problem is controlled. Exacerbating  diseases include chronic renal disease, diabetes and obesity. Pertinent negatives include no chest pain, myalgias or shortness of breath. Current antihyperlipidemic treatment includes statins. Risk factors for coronary artery disease include diabetes mellitus, dyslipidemia, family history, obesity, male sex, hypertension and a sedentary lifestyle.  Hypertension This is a chronic problem. The current episode started more than 1 year ago. The problem is uncontrolled. Pertinent negatives include no chest pain, headaches, neck pain, palpitations or shortness of breath. Risk factors for coronary artery disease include dyslipidemia, diabetes mellitus, male gender, obesity, sedentary lifestyle and family history. Past treatments include angiotensin blockers. Hypertensive end-organ damage includes kidney disease. Identifiable causes of hypertension include chronic renal disease.     Review  of Systems  Constitutional: Negative for chills, fatigue, fever and unexpected weight change.  HENT: Negative for dental problem, mouth sores and trouble swallowing.   Eyes: Negative for visual disturbance.  Respiratory: Negative for cough, choking, chest tightness, shortness of breath and wheezing.   Cardiovascular: Negative for chest pain, palpitations and leg swelling.  Gastrointestinal: Negative for abdominal distention, abdominal pain, constipation, diarrhea, nausea and vomiting.  Endocrine: Negative for polydipsia, polyphagia and polyuria.  Genitourinary: Negative for dysuria, flank pain, hematuria and urgency.  Musculoskeletal: Positive for gait problem. Negative for back pain, myalgias and neck pain.  Skin: Negative for pallor, rash and wound.  Neurological: Negative for seizures, syncope, weakness, numbness and headaches.  Psychiatric/Behavioral: Negative for confusion and dysphoric mood.    Objective:    BP (!) 149/86   Pulse 79   Ht 6' (1.829 m)   Wt (!) 373 lb 9.6 oz (169.5 kg)   BMI 50.67 kg/m    Wt Readings from Last 3 Encounters:  09/14/19 (!) 373 lb 9.6 oz (169.5 kg)  09/14/19 (!) 373 lb (169.2 kg)  07/08/19 (!) 368 lb (166.9 kg)     Physical Exam Constitutional:      General: He is not in acute distress.    Appearance: He is well-developed.  HENT:     Head: Normocephalic and atraumatic.  Neck:     Thyroid: No thyromegaly.     Trachea: No tracheal deviation.  Cardiovascular:     Rate and Rhythm: Normal rate.     Pulses:          Dorsalis pedis pulses are 1+ on the right side and 1+ on the left side.       Posterior tibial pulses are 1+ on the right side and 1+ on the left side.     Heart sounds: S1 normal and S2 normal. No murmur. No gallop.   Pulmonary:     Effort: Pulmonary effort is normal. No respiratory distress.     Breath sounds: No wheezing.  Abdominal:     General: There is no distension.     Tenderness: There is no abdominal tenderness. There is no guarding.  Musculoskeletal:     Right shoulder: No swelling or deformity.     Cervical back: Normal range of motion and neck supple.  Skin:    General: Skin is warm and dry.     Findings: No rash.     Nails: There is no clubbing.  Neurological:     Mental Status: He is alert and oriented to person, place, and time.     Cranial Nerves: No cranial nerve deficit.     Sensory: No sensory deficit.     Gait: Gait normal.     Deep Tendon Reflexes: Reflexes are normal and symmetric.  Psychiatric:        Speech: Speech normal.        Behavior: Behavior normal. Behavior is cooperative.        Thought Content: Thought content normal.        Judgment: Judgment normal.     CMP     Component Value Date/Time   NA 140 05/26/2019 0836   K 4.2 05/26/2019 0836   CL 101 05/26/2019 0836   CO2 26 05/26/2019 0836   GLUCOSE 85 05/26/2019 0836   BUN 18 05/26/2019 0836   CREATININE 1.49 (H) 05/26/2019 0836   CALCIUM 9.6 05/26/2019 0836   PROT 7.5 05/26/2019 0836   ALBUMIN 4.0 05/26/2019 0836   AST  24 05/26/2019  0836   ALT 33 05/26/2019 0836   ALKPHOS 125 (H) 05/26/2019 0836   BILITOT 0.5 05/26/2019 0836   GFRNONAA 51 (L) 05/26/2019 0836   GFRAA 59 (L) 05/26/2019 0836     Diabetic Labs (most recent): Lab Results  Component Value Date   HGBA1C 7.7 (A) 09/14/2019   HGBA1C 6.6 (A) 05/12/2019   HGBA1C 8.0 (H) 02/01/2019      Lipid Panel     Component Value Date/Time   CHOL 121 05/26/2019 0836   TRIG 156 (H) 05/26/2019 0836   HDL 29 (L) 05/26/2019 0836   CHOLHDL 4.2 05/26/2019 0836   LDLCALC 65 05/26/2019 0836      Lab Results  Component Value Date   TSH 2.320 04/29/2018   TSH 1.530 02/10/2014   TSH 1.780 10/19/2013   TSH 1.650 04/13/2013      Assessment & Plan:   1. Uncontrolled type 2 diabetes mellitus with stage 3 chronic kidney disease (HCC)  - Jon Moore has currently uncontrolled symptomatic type 2 DM since  59 years of age. -His point-of-care A1c is 7.7% increasing from 6.6%.  Patient admits that this is due to back dietary indiscretions during the holidays.  He has documented near target glycemic profile over the last several weeks.     - Recent labs reviewed. - I had a long discussion with him about the progressive nature of diabetes and the pathology behind its complications. -his diabetes is complicated by CKD and he remains at a high risk for more acute and chronic complications which include CAD, CVA, CKD, retinopathy, and neuropathy. These are all discussed in detail with him.  - I have counseled him on diet management and weight loss, by adopting a carbohydrate restricted/protein rich diet.  - he  admits there is a room for improvement in his diet and drink choices.  He promises to do better with his diet. -  Suggestion is made for him to avoid simple carbohydrates  from his diet including Cakes, Sweet Desserts / Pastries, Ice Cream, Soda (diet and regular), Sweet Tea, Candies, Chips, Cookies, Sweet Pastries,  Store Bought Juices, Alcohol in Excess of  1-2  drinks a day, Artificial Sweeteners, Coffee Creamer, and "Sugar-free" Products. This will help patient to have stable blood glucose profile and potentially avoid unintended weight gain.   - I encouraged him to switch to  unprocessed or minimally processed complex starch and increased protein intake (animal or plant source), fruits, and vegetables.  - he is advised to stick to a routine mealtimes to eat 3 meals  a day and avoid unnecessary snacks ( to snack only to correct hypoglycemia).   - he has been scheduled with Jearld Fenton, RDN, CDE for diabetes education.  - I have approached him with the following individualized plan to manage  his diabetes and patient agrees:   - he wishes to avoid further escalation his medications, however will continue to need insulin treatment in order for him to  maintain control of diabetes to target.  -Given his tightly controlled fasting glycemic profile, he is advised to continue Lantus 50 units daily at bedtime, continue to monitor blood glucose 2 times a day-daily before breakfast and at bedtime.   - he is warned not to take insulin without proper monitoring per orders.  - he is encouraged to call clinic for blood glucose levels less than 70 or above 300 mg /dl. - he is advised to continue Metformin 500 mg p.o. daily after  breakfast and after supper.      - he will be considered for incretin therapy as appropriate next visit.  - Patient specific target  A1c;  LDL, HDL, Triglycerides, and  Waist Circumference were discussed in detail.  2) Blood Pressure /Hypertension: His blood pressure is not controlled to target monitor.   he is advised to continue his current medications including clonidine 0.5 mg p.o. twice daily, carvedilol 25 mg p.o. twice daily, amlodipine/olmesartan 10/40 mg p.o. daily with breakfast .  3) Lipids/Hyperlipidemia:   Review of his recent lipid panel showed controlled  LDL at 70 .  he  is advised to continue Crestor 5 mg p.o. daily  at bedtime.  Side effects and precautions discussed with him.  4)  Weight/Diet:  Body mass index is 50.67 kg/m.  -   clearly complicating his diabetes care.  I discussed with him the fact that loss of 5 - 10% of his  current body weight will have the most impact on his diabetes management.  CDE Consult will be initiated . Exercise, and detailed carbohydrates information provided  -  detailed on discharge instructions.   5) Chronic Care/Health Maintenance:  -he  is on ACEI/ARB and Statin medications and  is encouraged to initiate and continue to follow up with Ophthalmology, Dentist,  Podiatrist at least yearly or according to recommendations, and advised to  stay away from smoking. I have recommended yearly flu vaccine and pneumonia vaccine at least every 5 years; moderate intensity exercise for up to 150 minutes weekly; and  sleep for at least 7 hours a day.  - he is  advised to maintain close follow up with [name], DO for primary care needs, as well as his other providers for optimal and coordinated care. - - Time spent on this patient care encounter:  35 min, of which > 50% was spent in  counseling and the rest reviewing his blood glucose logs , discussing his hypoglycemia and hyperglycemia episodes, reviewing his current and  previous labs / studies  ( including abstraction from other facilities) and medications  doses and developing a  long term treatment plan and documenting his care.   Please refer to Patient Instructions for Blood Glucose Monitoring and Insulin/Medications Dosing Guide"  in media tab for additional information. Please  also refer to " Patient Self Inventory" in the Media  tab for reviewed elements of pertinent patient history.  Michelene Heady participated in the discussions, expressed understanding, and voiced agreement with the above plans.  All questions were answered to his satisfaction. he is encouraged to contact clinic should he have any questions or concerns  prior to his return visit.    Follow up plan: - Return in about 4 months (around 01/12/2020) for Bring Meter and Logs- A1c in Office, Follow up with Pre-visit Labs.  Glade Lloyd, MD [facility] [address] [place], [place] [address] Phone: [phone]  Fax: [phone]    09/14/2019, 8:59 AM  This note was partially dictated with voice recognition software. Similar sounding words can be transcribed inadequately or may not  be corrected upon review.

## 2019-09-14 NOTE — Progress Notes (Signed)
  Medical Nutrition Therapy:  Appt start time: 845 end time:  0900.   Assessment:  Primary concerns today: Diabetes Type 2, OBesity Followup. Will see Dr. Fransico Him today also. Wt up 5 lbs. Heavy winter clothes on. A1C up to 7.7 but is recent blood sugars are much better.FBS: 85-100 and night 130-150. Changes made: cut out sweets, Eating more fresh fruits and vegetables and cutting out ice cream and sodas. . Lab Results  Component Value Date   HGBA1C 7.7 (A) 09/14/2019   CMP Latest Ref Rng & Units 05/26/2019 02/01/2019 10/28/2018  Glucose 65 - 99 mg/dL 85 154(M) 78  BUN 6 - 24 mg/dL 18 08(Q) 22  Creatinine 0.76 - 1.27 mg/dL 7.61(P) 5.09(T) 2.67(T)  Sodium 134 - 144 mmol/L 140 142 143  Potassium 3.5 - 5.2 mmol/L 4.2 4.3 4.4  Chloride 96 - 106 mmol/L 101 102 102  CO2 20 - 29 mmol/L 26 23 27   Calcium 8.7 - 10.2 mg/dL 9.6 9.3 9.5  Total Protein 6.0 - 8.5 g/dL 7.5 - -  Total Bilirubin 0.0 - 1.2 mg/dL 0.5 - -  Alkaline Phos 39 - 117 IU/L 125(H) - -  AST 0 - 40 IU/L 24 - -  ALT 0 - 44 IU/L 33 - -   Lab Results  Component Value Date   HGBA1C 6.6 (A) 05/12/2019   Lipid Panel     Component Value Date/Time   CHOL 121 05/26/2019 0836   TRIG 156 (H) 05/26/2019 0836   HDL 29 (L) 05/26/2019 0836   CHOLHDL 4.2 05/26/2019 0836   LDLCALC 65 05/26/2019 0836   LABVLDL 27 05/26/2019 0836     No Preferred Learning Style:        Learning Readiness:  Ready  Change in progress   MEDICATIONS:    DIETARY INTAKE:  24-hr recall:  B ( AM): HOney nut cherrios, banana and  Snk ( AM):  L ( PM): apple,  Nabs,crackers, diet sodas, Snk ( PM):  D ( PM): Chicken mashed poatoes, green beans, water, diet soda Snk ( PM):  Beverages: water, diet soda.  Usual physical activity:  Estimated energy needs: 1800-2000  calories 200 g carbohydrates 135 g protein 50 g fat  Progress Towards Goal(s):  In progress.   Nutritional Diagnosis:  NB-1.1 Food and nutrition-related knowledge deficit As  related to Diabetes and Obesity.  As evidenced by A1C 6.6% and BMI 48.    Intervention: Nutrition and Diabetes education provided on My Plate, CHO counting, meal planning, portion sizes, timing of meals, avoiding snacks between meals unless having a low blood sugar, target ranges for A1C and blood sugars, signs/symptoms and treatment of hyper/hypoglycemia, monitoring blood sugars, taking medications as prescribed, benefits of exercising 30 minutes per day and prevention of complications of DM. 07/26/2019  Goals  Keep up the great job! Walk 10-15 minutes on the treadmill 3 times per week Continue to eat meals on time Keep drinking water Get A1C down to 6.5% Lose 3 lbs per month. Choose high fiber lower fat foods.Teaching Method Utilized:  Visual Auditory Hands on  Handouts given during visit include:  The Plate Method  Meal Plan Card  Diabetes Instructions.   Barriers to learning/adherence to lifestyle change: none  Demonstrated degree of understanding via:  Teach Back   Monitoring/Evaluation:  Dietary intake, exercise, , and body weight in 3 month(s).

## 2019-09-27 ENCOUNTER — Ambulatory Visit: Payer: BC Managed Care – PPO | Admitting: Family Medicine

## 2019-10-06 ENCOUNTER — Ambulatory Visit: Payer: BC Managed Care – PPO | Admitting: Family Medicine

## 2019-10-17 ENCOUNTER — Other Ambulatory Visit: Payer: Self-pay | Admitting: Family Medicine

## 2019-10-24 ENCOUNTER — Other Ambulatory Visit: Payer: Self-pay | Admitting: "Endocrinology

## 2019-10-24 DIAGNOSIS — E1122 Type 2 diabetes mellitus with diabetic chronic kidney disease: Secondary | ICD-10-CM

## 2019-10-24 DIAGNOSIS — N183 Chronic kidney disease, stage 3 unspecified: Secondary | ICD-10-CM

## 2019-10-26 ENCOUNTER — Other Ambulatory Visit: Payer: Self-pay

## 2019-10-27 ENCOUNTER — Encounter: Payer: Self-pay | Admitting: Family Medicine

## 2019-10-27 ENCOUNTER — Ambulatory Visit: Payer: BC Managed Care – PPO | Admitting: Family Medicine

## 2019-10-27 VITALS — BP 114/78 | HR 65 | Temp 97.3°F | Ht 72.0 in | Wt 373.0 lb

## 2019-10-27 DIAGNOSIS — I129 Hypertensive chronic kidney disease with stage 1 through stage 4 chronic kidney disease, or unspecified chronic kidney disease: Secondary | ICD-10-CM | POA: Diagnosis not present

## 2019-10-27 DIAGNOSIS — E1121 Type 2 diabetes mellitus with diabetic nephropathy: Secondary | ICD-10-CM | POA: Diagnosis not present

## 2019-10-27 DIAGNOSIS — N1831 Chronic kidney disease, stage 3a: Secondary | ICD-10-CM | POA: Diagnosis not present

## 2019-10-27 DIAGNOSIS — I10 Essential (primary) hypertension: Secondary | ICD-10-CM | POA: Diagnosis not present

## 2019-10-27 DIAGNOSIS — E785 Hyperlipidemia, unspecified: Secondary | ICD-10-CM

## 2019-10-27 DIAGNOSIS — E1169 Type 2 diabetes mellitus with other specified complication: Secondary | ICD-10-CM

## 2019-10-27 DIAGNOSIS — I152 Hypertension secondary to endocrine disorders: Secondary | ICD-10-CM

## 2019-10-27 DIAGNOSIS — E1159 Type 2 diabetes mellitus with other circulatory complications: Secondary | ICD-10-CM

## 2019-10-27 DIAGNOSIS — E1122 Type 2 diabetes mellitus with diabetic chronic kidney disease: Secondary | ICD-10-CM

## 2019-10-27 DIAGNOSIS — Z794 Long term (current) use of insulin: Secondary | ICD-10-CM

## 2019-10-27 NOTE — Patient Instructions (Signed)
We will check your kidney function today.  Should have this result in the next day or so.  You are doing an excellent job with diet modification and really working on your lifestyle.  Very impressed and you should be proud of yourself.  Continue on your journey; I think a 10 pound weight loss is totally reasonable by May. I will work with you in any way that I can to help you maintain and achieve your goals.

## 2019-10-27 NOTE — Progress Notes (Signed)
Subjective: CC: DM2, HTN, HLD PCP: Jon Norlander, DO BWG:[id number] [name] is a 59 y.o. male presenting to clinic today for:  1. Type 2 Diabetes w/ HTN and HLD Patient reports: Glucometer: One Touch ultra. Taking medication(s): Metformin 500p.o. twice daily, Lantus 50 units daily, Lasix BID, Coreg BID, Clonidine BID, Crestor qhs.  He continues to follow with Dr Jon Moore.  Last appt 09/14/19.  A1c was a little up at that visit was previously controlled.  This was attributed to the holidays.  His blood sugars leading up to that appointment were well controlled as he was back on his exercise and eating plan.  No changes to his medications were made at that time.  He is working with a nutritionist regularly.  He is working on carb modification and has essentially eliminated sweet tea from his diet.  His goal is to lose 10 pounds by May.  He is working out on a treadmill in short bursts.  His knee limits him but he is working through this.  Last eye exam: Done at my eye doctor.  There was a lesion on the left eye that he thinks was an ulcer that they were watching and following up on in May.  Though he thinks he will be switching eye doctors to Dr. Marin Moore Last foot exam: UTD Last A1c:  Lab Results  Component Value Date   HGBA1C 7.7 (A) 09/14/2019  Nephropathy screen indicated?:  On an ARB Last flu, zoster and/or pneumovax: UTD Immunization History  Administered Date(s) Administered  . Influenza,inj,Quad PF,6+ Mos 07/19/2014, 06/14/2016, 07/15/2017, 07/29/2018, 05/26/2019  . Influenza-Unspecified 07/19/2013  . Pneumococcal Conjugate-13 04/19/2014  . Pneumococcal Polysaccharide-23 04/29/2018  . Td 05/13/2011  . Tdap 05/13/2011   ROS: No dizziness, visual disturbance, numbness tingling, chest pain or shortness of breath.   No Known Allergies Past Medical History:  Diagnosis Date  . Diabetes mellitus   . Frozen shoulder syndrome 2013   Right shoulder.  Has had PT.  Marland Kitchen Hypercholesterolemia   .  Hypertension     Current Outpatient Medications:  .  allopurinol (ZYLOPRIM) 100 MG tablet, Take 1/2 (one-half) tablet by mouth once daily, Disp: 45 tablet, Rfl: 0 .  amLODipine-olmesartan (AZOR) 10-40 MG tablet, Take 1 tablet by mouth once daily, Disp: 90 tablet, Rfl: 1 .  blood glucose meter kit and supplies, Dispense based on patient and insurance preference. Use up to two times daily as directed. (FOR ICD-10 E11.65), Disp: 1 each, Rfl: 5 .  carvedilol (COREG) 25 MG tablet, TAKE 1 TABLET BY MOUTH TWICE DAILY WITH A MEAL, Disp: 180 tablet, Rfl: 1 .  Cholecalciferol (VITAMIN D3) 125 MCG (5000 UT) CAPS, Take 1 capsule (5,000 Units total) by mouth daily., Disp: 90 capsule, Rfl: 0 .  cloNIDine (CATAPRES) 0.1 MG tablet, Take 1 tablet by mouth twice daily, Disp: 180 tablet, Rfl: 1 .  diclofenac sodium (VOLTAREN) 1 % GEL, Apply 4 g topically 4 (four) times daily., Disp: , Rfl:  .  furosemide (LASIX) 80 MG tablet, Take 1 tablet by mouth twice daily, Disp: 180 tablet, Rfl: 0 .  glucose blood test strip, 1 each by Other route 2 (two) times daily. Use as instructed bid E11.65, Disp: 100 each, Rfl: 5 .  Insulin Pen Needle 29G X 12.7MM MISC, Use to inject Lantus once qd, Disp: 100 each, Rfl: 3 .  LANTUS SOLOSTAR 100 UNIT/ML Solostar Pen, INJECT 50 UNITS SUBCUTANEOUSLY AT BEDTIME, Disp: 30 mL, Rfl: 0 .  metFORMIN (GLUCOPHAGE) 1000 MG tablet,  Take 0.5 tablets (500 mg total) by mouth 2 (two) times daily with a meal., Disp: 180 tablet, Rfl: 2 .  mometasone (NASONEX) 50 MCG/ACT nasal spray, Use 2 spray(s) in each nostril once daily, Disp: 51 g, Rfl: 0 .  rosuvastatin (CRESTOR) 5 MG tablet, Take 1 tablet by mouth once daily, Disp: 90 tablet, Rfl: 1 Social History   Socioeconomic History  . Marital status: Married    Spouse name: Not on file  . Number of children: 1  . Years of education: Not on file  . Highest education level: Not on file  Occupational History  . 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 Smoking status: Never Smoker  . Smokeless tobacco: Never Used  Substance and Sexual Activity  . Alcohol use: No  . Drug use: No  . Sexual activity: Not on file  Other Topics Concern  . Not on file  Social History Narrative  . Not on file   Social Determinants of Health   Financial Resource Strain:   . Difficulty of Paying Living Expenses: Not on file  Food Insecurity:   . Worried About Charity fundraiser in the Last Year: Not on file  . Ran Out of Food in the Last Year: Not on file  Transportation Needs:   . Lack of Transportation (Medical): Not on file  . Lack of Transportation (Non-Medical): Not on file  Physical Activity:   . Days of Exercise per Week: Not on file  . Minutes of Exercise per Session: Not on file  Stress:   . Feeling of Stress : Not on file  Social Connections:   . Frequency of Communication with Friends and Family: Not on file  . Frequency of Social Gatherings with Friends and Family: Not on file  . Attends Religious Services: Not on file  . Active Member of Clubs or Organizations: Not on file  . Attends Archivist Meetings: Not on file  . Marital Status: Not on file  Intimate Partner Violence:   . Fear of Current or Ex-Partner: Not on file  . Emotionally Abused: Not on file  . Physically Abused: Not on file  . Sexually Abused: Not on file   Family History  Problem Relation Age of Onset  . Diabetes Mother   . Heart failure Mother   . Hypertension Father   . Kidney disease Father   . Cancer Sister   . Hodgkin's lymphoma Sister   . Hypertension Brother   . Hypertension Brother   . Hypertension Brother   . Colon cancer Neg Hx     Objective: Office vital signs reviewed. BP 114/78   Pulse 65   Temp (!) 97.3 F (36.3 C) (Temporal)   Ht 6' (1.829 m)   Wt (!) 373 lb (169.2 kg)   SpO2 96%   BMI 50.59 kg/m   Physical Examination:  General: Awake, alert, obese. No acute distress HEENT:  Normal, sclera white, MMM; no carotid bruits Cardio: regular rate and rhythm, S1S2 heard, no murmurs appreciated Pulm: clear to auscultation bilaterally, no wheezes, rhonchi or rales; normal work of breathing on room air  Assessment/ Plan: 59 y.o. male   1. Hypertension associated with diabetes ([place]) Well-controlled on current regimen.  No changes - Basic Metabolic Panel  2. Hyperlipidemia associated with type 2 diabetes mellitus ([place]) Continue statin  3. Type 2 diabetes mellitus with stage 3a chronic kidney disease, with long-term current use of  insulin ([place]) Technically uncontrolled but he has been really working hard on getting back into a structured diet and exercise regimen.  He will continue to follow-up with Dr. Jodelle Green every 3 to 4 months as scheduled.  We will obtain his diabetic eye exam in put in his EMR. - Basic Metabolic Panel  4. Morbid obesity ([place]) He is lost 4 pounds since her last visit and is really working on lifestyle modification.  We discussed that a 10 pound weight loss by May is a reasonable goal.  We discussed ways to graduate his exercise regimen.  I congratulated him on his achievements thus far.  He seems to be doing well.    No orders of the defined types were placed in this encounter.  No orders of the defined types were placed in this encounter.   Jon Norlander, DO [place] [phone]

## 2019-10-28 DIAGNOSIS — B351 Tinea unguium: Secondary | ICD-10-CM | POA: Diagnosis not present

## 2019-10-28 DIAGNOSIS — E1142 Type 2 diabetes mellitus with diabetic polyneuropathy: Secondary | ICD-10-CM | POA: Diagnosis not present

## 2019-10-28 LAB — BASIC METABOLIC PANEL
BUN/Creatinine Ratio: 16 (ref 9–20)
BUN: 27 mg/dL — ABNORMAL HIGH (ref 6–24)
CO2: 25 mmol/L (ref 20–29)
Calcium: 9.6 mg/dL (ref 8.7–10.2)
Chloride: 103 mmol/L (ref 96–106)
Creatinine, Ser: 1.7 mg/dL — ABNORMAL HIGH (ref 0.76–1.27)
GFR calc Af Amer: 50 mL/min/{1.73_m2} — ABNORMAL LOW (ref >59)
GFR calc non Af Amer: 43 mL/min/{1.73_m2} — ABNORMAL LOW (ref >59)
Glucose: 85 mg/dL (ref 65–99)
Potassium: 4.5 mmol/L (ref 3.5–5.2)
Sodium: 144 mmol/L (ref 134–144)

## 2019-12-05 ENCOUNTER — Other Ambulatory Visit: Payer: Self-pay | Admitting: Family Medicine

## 2019-12-07 IMAGING — DX DG ANKLE COMPLETE 3+V*R*
3 series · 3 of 3 positions shown · non-contrast
Comparison: None.

CLINICAL DATA: Right ankle pain due to a twisting injury today.
Initial encounter.

EXAM:
RIGHT ANKLE - COMPLETE 3+ VIEW

[ankle ap]
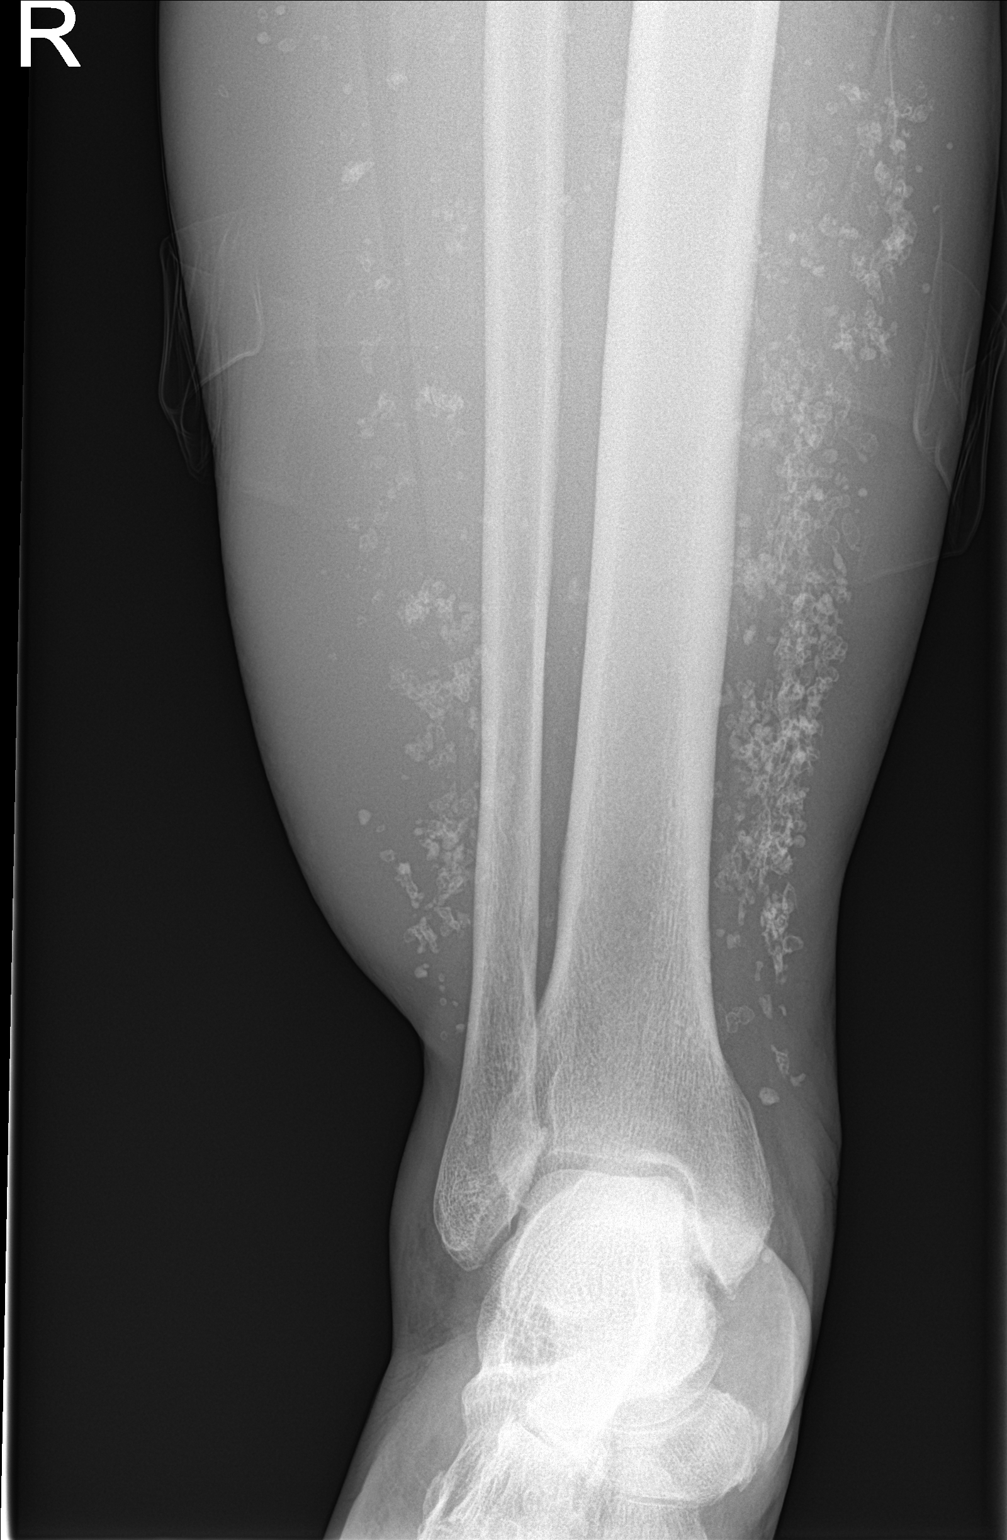

[ankle obl]
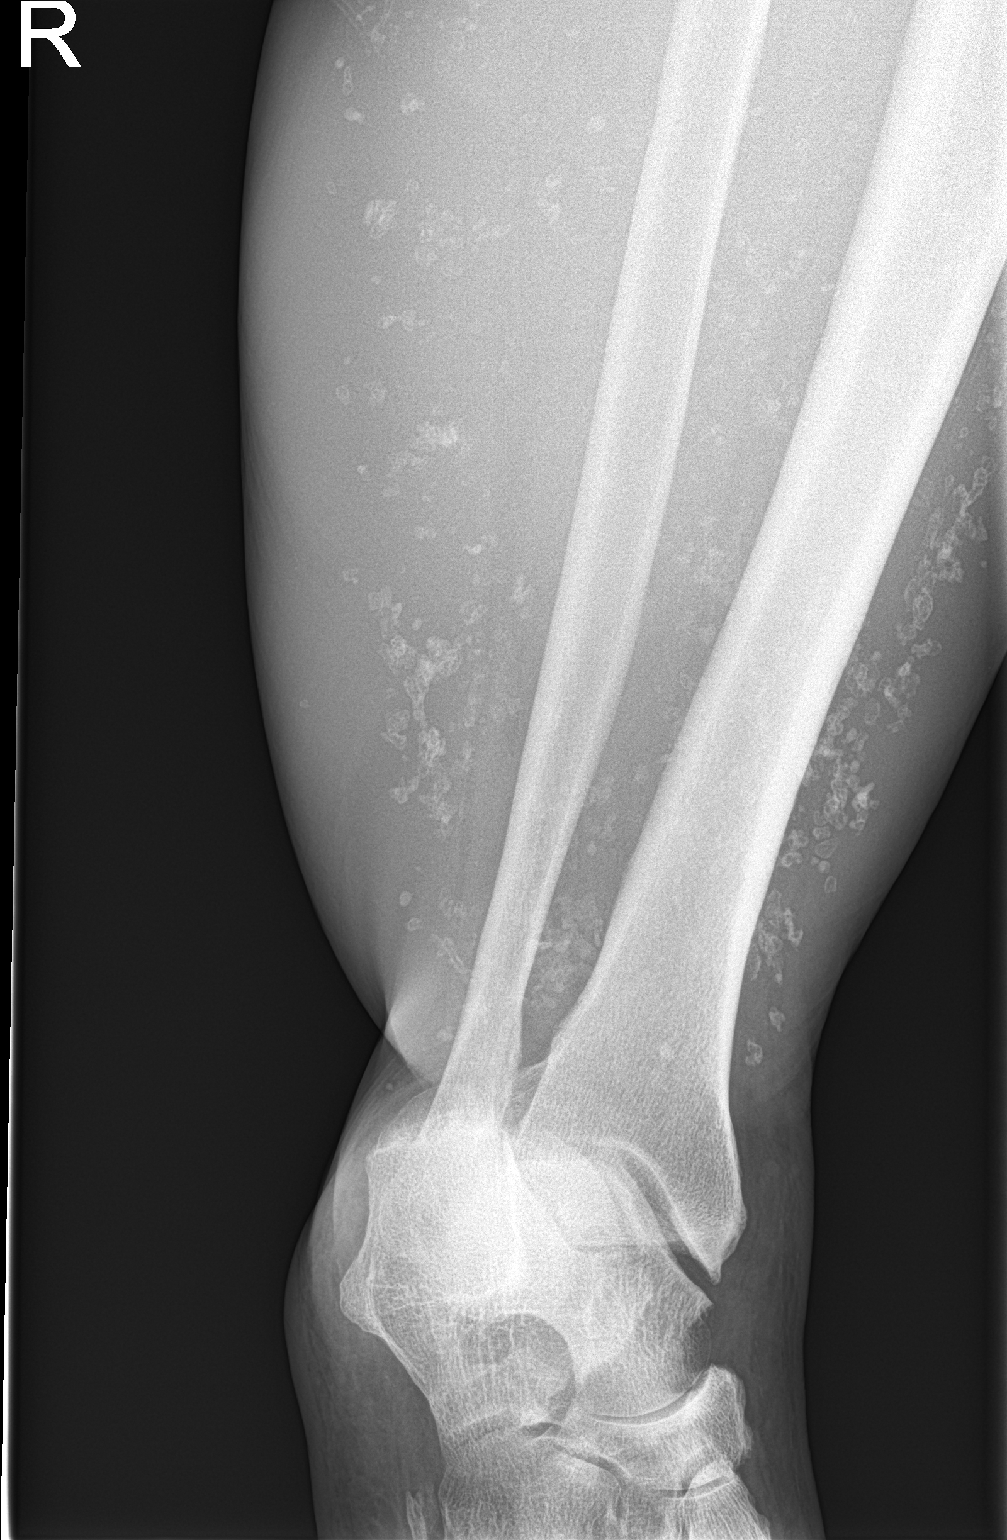

[ankle lat]
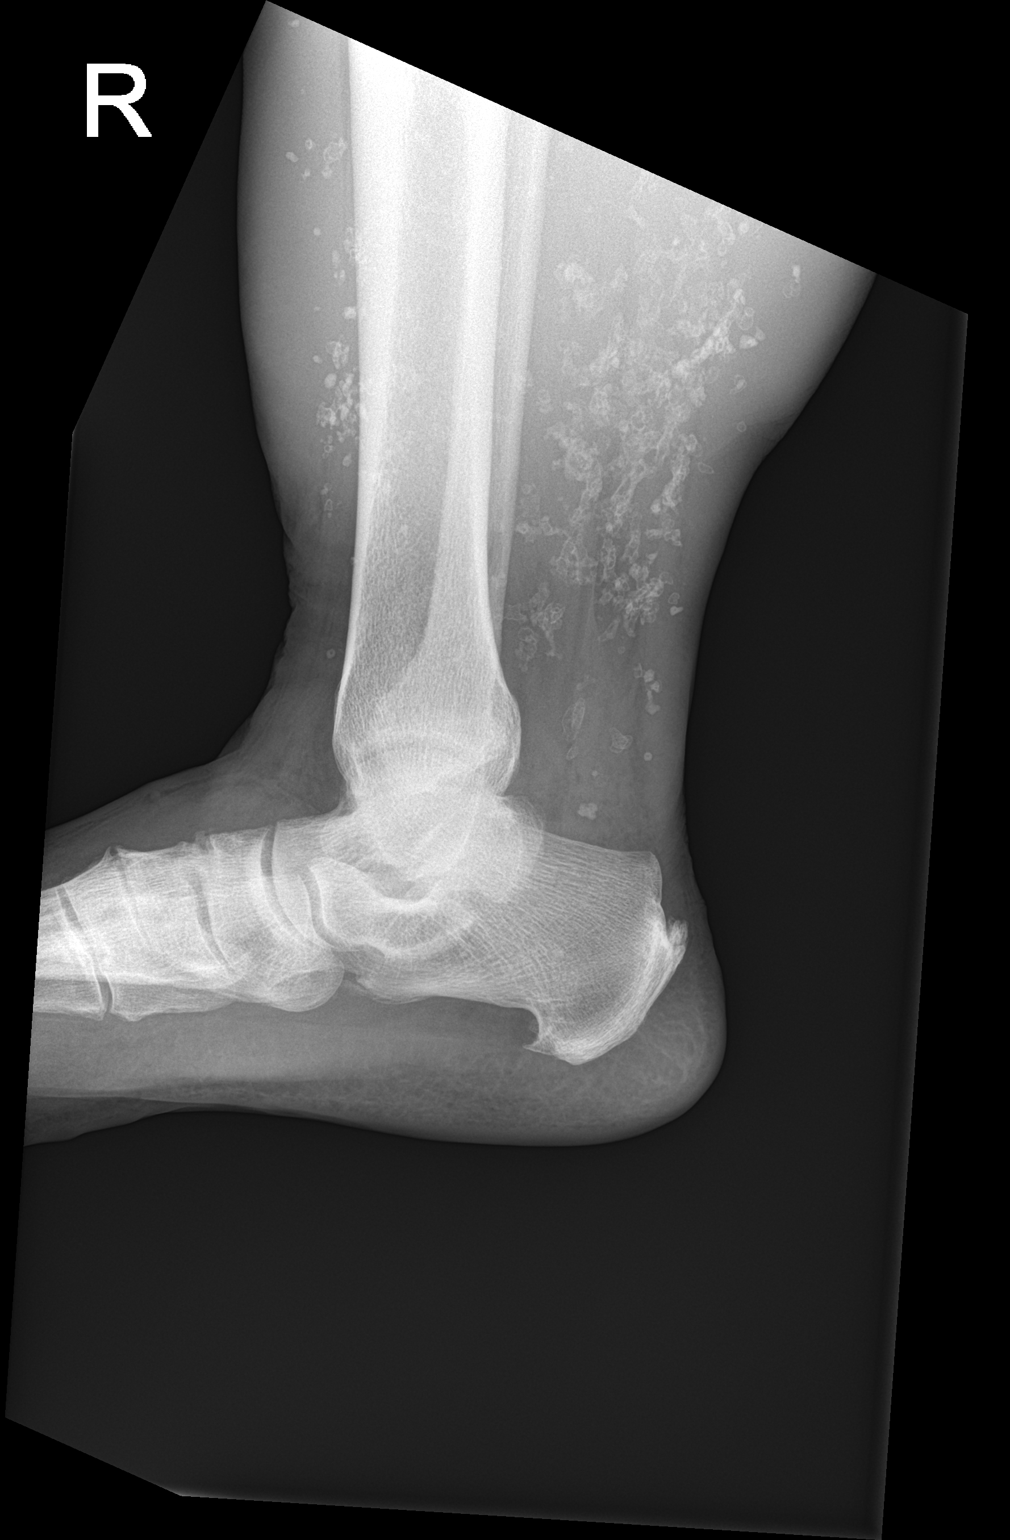

[3 of 3 positions shown; findings below may reference images not displayed]

FINDINGS: There is no acute bony or joint abnormality. Moderate midfoot
osteoarthritis is seen. Plantar calcaneal spurring is noted.
Extensive subcutaneous calcifications are present about the lower
leg.
IMPRESSION: No acute abnormality.

Subcutaneous calcifications of the lower leg are likely due to prior
infectious or inflammatory process.

Moderate midfoot osteoarthritis.

## 2019-12-14 ENCOUNTER — Other Ambulatory Visit: Payer: Self-pay | Admitting: Family Medicine

## 2019-12-23 ENCOUNTER — Other Ambulatory Visit: Payer: Self-pay | Admitting: Family Medicine

## 2019-12-23 ENCOUNTER — Other Ambulatory Visit: Payer: Self-pay | Admitting: "Endocrinology

## 2019-12-23 DIAGNOSIS — E1122 Type 2 diabetes mellitus with diabetic chronic kidney disease: Secondary | ICD-10-CM

## 2019-12-23 DIAGNOSIS — M1039 Gout due to renal impairment, multiple sites: Secondary | ICD-10-CM

## 2019-12-23 DIAGNOSIS — Z794 Long term (current) use of insulin: Secondary | ICD-10-CM

## 2019-12-23 DIAGNOSIS — N183 Chronic kidney disease, stage 3 unspecified: Secondary | ICD-10-CM

## 2019-12-27 ENCOUNTER — Other Ambulatory Visit: Payer: Self-pay | Admitting: "Endocrinology

## 2020-01-02 ENCOUNTER — Other Ambulatory Visit: Payer: Self-pay | Admitting: Family Medicine

## 2020-01-06 DIAGNOSIS — B351 Tinea unguium: Secondary | ICD-10-CM | POA: Diagnosis not present

## 2020-01-06 DIAGNOSIS — M79676 Pain in unspecified toe(s): Secondary | ICD-10-CM | POA: Diagnosis not present

## 2020-01-12 ENCOUNTER — Encounter: Payer: Self-pay | Admitting: Family Medicine

## 2020-01-12 ENCOUNTER — Telehealth: Payer: Self-pay | Admitting: "Endocrinology

## 2020-01-12 ENCOUNTER — Telehealth: Payer: Self-pay

## 2020-01-12 ENCOUNTER — Other Ambulatory Visit: Payer: Self-pay

## 2020-01-12 DIAGNOSIS — IMO0002 Reserved for concepts with insufficient information to code with codable children: Secondary | ICD-10-CM

## 2020-01-12 NOTE — Telephone Encounter (Signed)
Can you update lab order to [facility]

## 2020-01-12 NOTE — Telephone Encounter (Signed)
Orders updated

## 2020-01-12 NOTE — Telephone Encounter (Signed)
Lab orders sent to [facility] per pt's request so he can have them drawn at [facility] PCP office.

## 2020-01-13 ENCOUNTER — Ambulatory Visit: Payer: BC Managed Care – PPO | Admitting: Nutrition

## 2020-01-13 ENCOUNTER — Ambulatory Visit: Payer: BC Managed Care – PPO | Admitting: "Endocrinology

## 2020-01-15 ENCOUNTER — Other Ambulatory Visit: Payer: Self-pay | Admitting: Family Medicine

## 2020-02-02 DIAGNOSIS — M1712 Unilateral primary osteoarthritis, left knee: Secondary | ICD-10-CM | POA: Diagnosis not present

## 2020-02-02 DIAGNOSIS — Z6841 Body Mass Index (BMI) 40.0 and over, adult: Secondary | ICD-10-CM | POA: Diagnosis not present

## 2020-02-16 ENCOUNTER — Other Ambulatory Visit: Payer: Self-pay | Admitting: "Endocrinology

## 2020-02-16 DIAGNOSIS — N183 Chronic kidney disease, stage 3 unspecified: Secondary | ICD-10-CM

## 2020-02-16 DIAGNOSIS — E1122 Type 2 diabetes mellitus with diabetic chronic kidney disease: Secondary | ICD-10-CM

## 2020-03-16 DIAGNOSIS — M79676 Pain in unspecified toe(s): Secondary | ICD-10-CM | POA: Diagnosis not present

## 2020-03-16 DIAGNOSIS — B351 Tinea unguium: Secondary | ICD-10-CM | POA: Diagnosis not present

## 2020-04-09 ENCOUNTER — Other Ambulatory Visit: Payer: Self-pay | Admitting: "Endocrinology

## 2020-04-09 DIAGNOSIS — E1122 Type 2 diabetes mellitus with diabetic chronic kidney disease: Secondary | ICD-10-CM

## 2020-04-09 DIAGNOSIS — N183 Chronic kidney disease, stage 3 unspecified: Secondary | ICD-10-CM

## 2020-04-10 ENCOUNTER — Other Ambulatory Visit: Payer: Self-pay | Admitting: "Endocrinology

## 2020-04-22 ENCOUNTER — Other Ambulatory Visit: Payer: Self-pay | Admitting: "Endocrinology

## 2020-04-22 ENCOUNTER — Other Ambulatory Visit: Payer: Self-pay | Admitting: Family Medicine

## 2020-04-22 DIAGNOSIS — M1039 Gout due to renal impairment, multiple sites: Secondary | ICD-10-CM

## 2020-04-26 ENCOUNTER — Ambulatory Visit: Payer: BC Managed Care – PPO | Admitting: Family Medicine

## 2020-04-26 ENCOUNTER — Encounter: Payer: Self-pay | Admitting: Family Medicine

## 2020-04-26 ENCOUNTER — Other Ambulatory Visit: Payer: Self-pay

## 2020-04-26 VITALS — BP 128/71 | HR 67 | Temp 97.8°F | Ht 72.0 in | Wt 379.0 lb

## 2020-04-26 DIAGNOSIS — Z794 Long term (current) use of insulin: Secondary | ICD-10-CM | POA: Diagnosis not present

## 2020-04-26 DIAGNOSIS — E1169 Type 2 diabetes mellitus with other specified complication: Secondary | ICD-10-CM

## 2020-04-26 DIAGNOSIS — E1159 Type 2 diabetes mellitus with other circulatory complications: Secondary | ICD-10-CM | POA: Diagnosis not present

## 2020-04-26 DIAGNOSIS — E1121 Type 2 diabetes mellitus with diabetic nephropathy: Secondary | ICD-10-CM

## 2020-04-26 DIAGNOSIS — E1122 Type 2 diabetes mellitus with diabetic chronic kidney disease: Secondary | ICD-10-CM

## 2020-04-26 DIAGNOSIS — Z125 Encounter for screening for malignant neoplasm of prostate: Secondary | ICD-10-CM

## 2020-04-26 DIAGNOSIS — E785 Hyperlipidemia, unspecified: Secondary | ICD-10-CM

## 2020-04-26 DIAGNOSIS — N1831 Chronic kidney disease, stage 3a: Secondary | ICD-10-CM

## 2020-04-26 DIAGNOSIS — I10 Essential (primary) hypertension: Secondary | ICD-10-CM

## 2020-04-26 DIAGNOSIS — E559 Vitamin D deficiency, unspecified: Secondary | ICD-10-CM

## 2020-04-26 DIAGNOSIS — I152 Hypertension secondary to endocrine disorders: Secondary | ICD-10-CM

## 2020-04-26 LAB — BAYER DCA HB A1C WAIVED: HB A1C (BAYER DCA - WAIVED): 9.4 % — ABNORMAL HIGH (ref <7.0)

## 2020-04-26 MED ORDER — LANTUS SOLOSTAR 100 UNIT/ML ~~LOC~~ SOPN
55.0000 [IU] | PEN_INJECTOR | Freq: Every day | SUBCUTANEOUS | 1 refills | Status: DC
Start: 2020-04-26 — End: 2020-08-04

## 2020-04-26 NOTE — Patient Instructions (Signed)
Sugar has gone up A LOT.  I want you to increase to 55 units of Lantus.  Continue to increase by 1 unit every 2 days your FASTING blood sugar is above 150.  Follow up with Dr Fransico Him.  I will cc him today's labs and notes.  If your FASTING blood sugar is above 150 for 2 consecutive days, increase by 1 unit of your long acting insulin.  Example: Day 1: Blood sugar was 159 Day 2: Blood sugar was 205  Increase Lantus to 56 units Day 3: Blood sugar is 202 Day 4: Blood sugars 168  Increase Lantus to 57 units Day 5: Blood sugar is 105 Day 6: Blood sugars 145  Stick with 57 units.  Do not increase.

## 2020-04-26 NOTE — Progress Notes (Addendum)
Subjective: CC: DM2, HTN, HLD PCP: [name], DO VVO:HYWVPXT [place] is a 59 y.o. male presenting to clinic today for:  1. Type 2 Diabetes w/ CKD 3 a, HTN and HLD Patient reports: Glucometer: One Touch ultra. Taking medication(s): Metformin 500p.o. twice daily, Lantus 50 units daily, Lasix BID, Coreg BID, Clonidine BID, Crestor qhs.   He was taking 70 units of Lantus at one point and blood sugar seem to be doing well but he notes that this was reduced and despite rise in blood sugar it was kept at 50 units.  He is compliant with this.  He has not had any problems affording this at this time.  Denies any hypoglycemic episodes.  Blood sugars are typically well above 130s.  He has not followed up with endocrinology because he had not been scheduled for his preoffice visit labs.  Last eye exam: ROI completed today Last foot exam: UTD Last A1c:  Lab Results  Component Value Date   HGBA1C 7.7 (A) 09/14/2019  Nephropathy screen indicated?:  On an ARB Last flu, zoster and/or pneumovax: UTD Immunization History  Administered Date(s) Administered  . Influenza,inj,Quad PF,6+ Mos 07/19/2014, 06/14/2016, 07/15/2017, 07/29/2018, 05/26/2019  . Influenza-Unspecified 07/19/2013  . Pneumococcal Conjugate-13 04/19/2014  . Pneumococcal Polysaccharide-23 04/29/2018  . Td 05/13/2011  . Tdap 05/13/2011   ROS: No dizziness, visual disturbance, numbness tingling, chest pain or shortness of breath.  No polydipsia or polyuria.  2.  Chronic left-sided knee pain He is status post corticosteroid injection about 2 months ago.  He will be having a repeat steroid injection next week.  He continues to have an antalgic gait and pain with ambulation most days.   No Known Allergies Past Medical History:  Diagnosis Date  . Diabetes mellitus   . Frozen shoulder syndrome 2013   Right shoulder.  Has had PT.  Marland Kitchen Hypercholesterolemia   . Hypertension     Current Outpatient Medications:  .  allopurinol  (ZYLOPRIM) 100 MG tablet, Take 1/2 (one-half) tablet by mouth once daily, Disp: 45 tablet, Rfl: 0 .  amLODipine-olmesartan (AZOR) 10-40 MG tablet, Take 1 tablet by mouth once daily, Disp: 90 tablet, Rfl: 1 .  blood glucose meter kit and supplies, Dispense based on patient and insurance preference. Use up to two times daily as directed. (FOR ICD-10 E11.65), Disp: 1 each, Rfl: 5 .  carvedilol (COREG) 25 MG tablet, TAKE 1 TABLET BY MOUTH TWICE DAILY WITH A MEAL, Disp: 180 tablet, Rfl: 1 .  Cholecalciferol (VITAMIN D3) 125 MCG (5000 UT) CAPS, Take 1 capsule by mouth once daily, Disp: 90 capsule, Rfl: 0 .  cloNIDine (CATAPRES) 0.1 MG tablet, Take 1 tablet by mouth twice daily, Disp: 180 tablet, Rfl: 1 .  CONTOUR NEXT TEST test strip, USE TO CHECK SUGAR UP TO TWICE DAILY, Disp: 100 strip, Rfl: 0 .  diclofenac sodium (VOLTAREN) 1 % GEL, Apply 4 g topically 4 (four) times daily., Disp: , Rfl:  .  furosemide (LASIX) 80 MG tablet, Take 1 tablet by mouth twice daily, Disp: 180 tablet, Rfl: 0 .  Insulin Pen Needle 29G X 12.7MM MISC, Use to inject Lantus once qd, Disp: 100 each, Rfl: 3 .  LANTUS SOLOSTAR 100 UNIT/ML Solostar Pen, INJECT 50 UNITS SUBCUTANEOUSLY AT BEDTIME, Disp: 30 mL, Rfl: 0 .  metFORMIN (GLUCOPHAGE) 1000 MG tablet, Take 0.5 tablets (500 mg total) by mouth 2 (two) times daily with a meal., Disp: 180 tablet, Rfl: 2 .  mometasone (NASONEX) 50 MCG/ACT nasal spray,  Use 2 spray(s) in each nostril once daily, Disp: 51 g, Rfl: 0 .  rosuvastatin (CRESTOR) 5 MG tablet, Take 1 tablet by mouth once daily, Disp: 90 tablet, Rfl: 1 Social History   Socioeconomic History  . Marital status: Married    Spouse name: Not on file  . Number of children: 1  . Years of education: Not on file  . Highest education level: Not on file  Occupational History  . Occupation: [employer]    Comment: Engineer, building services   Tobacco Use  . Smoking status: Never Smoker  . Smokeless tobacco: Never Used  Vaping  Use  . Vaping Use: Never used  Substance and Sexual Activity  . Alcohol use: No  . Drug use: No  . Sexual activity: Not on file  Other Topics Concern  . Not on file  Social History Narrative  . Not on file   Social Determinants of Health   Financial Resource Strain:   . Difficulty of Paying Living Expenses: Not on file  Food Insecurity:   . Worried About Charity fundraiser in the Last Year: Not on file  . Ran Out of Food in the Last Year: Not on file  Transportation Needs:   . Lack of Transportation (Medical): Not on file  . Lack of Transportation (Non-Medical): Not on file  Physical Activity:   . Days of Exercise per Week: Not on file  . Minutes of Exercise per Session: Not on file  Stress:   . Feeling of Stress : Not on file  Social Connections:   . Frequency of Communication with Friends and Family: Not on file  . Frequency of Social Gatherings with Friends and Family: Not on file  . Attends Religious Services: Not on file  . Active Member of Clubs or Organizations: Not on file  . Attends Archivist Meetings: Not on file  . Marital Status: Not on file  Intimate Partner Violence:   . Fear of Current or Ex-Partner: Not on file  . Emotionally Abused: Not on file  . Physically Abused: Not on file  . Sexually Abused: Not on file   Family History  Problem Relation Age of Onset  . Diabetes Mother   . Heart failure Mother   . Hypertension Father   . Kidney disease Father   . Cancer Sister   . Hodgkin's lymphoma Sister   . Hypertension Brother   . Hypertension Brother   . Hypertension Brother   . Colon cancer Neg Hx     Objective: Office vital signs reviewed. BP 128/71   Pulse 67   Temp 97.8 F (36.6 C) (Temporal)   Ht 6' (1.829 m)   Wt (!) 379 lb (171.9 kg)   SpO2 97%   BMI 51.40 kg/m   Physical Examination:  General: Awake, alert, obese. No acute distress HEENT: Normal, sclera white, MMM  Cardio: regular rate and rhythm, S1S2 heard, no  murmurs appreciated Pulm: clear to auscultation bilaterally, no wheezes, rhonchi or rales; normal work of breathing on room air MSK: Antalgic gait  Assessment/ Plan: 59 y.o. male   1. Type 2 diabetes mellitus with stage 3a chronic kidney disease, with long-term current use of insulin ([place]) Sharp rise in A1c to 9.4 today.  I encouraged him to go up to 55 units of Lantus and increase by 1 unit every 2 days for fasting blood sugars above 150.  I highly encouraged him to follow-up with his endocrinologist as scheduled.  We  will CC remainder of labs once they are available. - Bayer DCA Hb A1c Waived - insulin glargine (LANTUS SOLOSTAR) 100 UNIT/ML Solostar Pen; Inject 55-70 Units into the skin at bedtime.  Dispense: 45 mL; Refill: 1  2. Hyperlipidemia associated with type 2 diabetes mellitus ([place]) Continue statin - CMP14+EGFR - Lipid Panel  3. Hypertension associated with diabetes ([place]) Controlled  4. Morbid obesity ([place]) Working on weight loss with successful couple pound weight loss  5. Vitamin D deficiency - VITAMIN D 25 Hydroxy (Vit-D Deficiency, Fractures)  6. Screening for malignant neoplasm of prostate Asymptomatic - PSA   No orders of the defined types were placed in this encounter.  No orders of the defined types were placed in this encounter.   [name], DO [place] [phone]

## 2020-04-27 LAB — CMP14+EGFR
ALT: 26 IU/L (ref 0–44)
AST: 19 IU/L (ref 0–40)
Albumin/Globulin Ratio: 1.1 — ABNORMAL LOW (ref 1.2–2.2)
Albumin: 4 g/dL (ref 3.8–4.9)
Alkaline Phosphatase: 135 IU/L — ABNORMAL HIGH (ref 48–121)
BUN/Creatinine Ratio: 15 (ref 9–20)
BUN: 22 mg/dL (ref 6–24)
Bilirubin Total: 0.4 mg/dL (ref 0.0–1.2)
CO2: 25 mmol/L (ref 20–29)
Calcium: 9.2 mg/dL (ref 8.7–10.2)
Chloride: 103 mmol/L (ref 96–106)
Creatinine, Ser: 1.49 mg/dL — ABNORMAL HIGH (ref 0.76–1.27)
GFR calc Af Amer: 59 mL/min/{1.73_m2} — ABNORMAL LOW (ref >59)
GFR calc non Af Amer: 51 mL/min/{1.73_m2} — ABNORMAL LOW (ref >59)
Globulin, Total: 3.6 g/dL (ref 1.5–4.5)
Glucose: 135 mg/dL — ABNORMAL HIGH (ref 65–99)
Potassium: 4.2 mmol/L (ref 3.5–5.2)
Sodium: 141 mmol/L (ref 134–144)
Total Protein: 7.6 g/dL (ref 6.0–8.5)

## 2020-04-27 LAB — LIPID PANEL
Chol/HDL Ratio: 5.7 ratio — ABNORMAL HIGH (ref 0.0–5.0)
Cholesterol, Total: 153 mg/dL (ref 100–199)
HDL: 27 mg/dL — ABNORMAL LOW (ref >39)
LDL Chol Calc (NIH): 86 mg/dL (ref 0–99)
Triglycerides: 238 mg/dL — ABNORMAL HIGH (ref 0–149)
VLDL Cholesterol Cal: 40 mg/dL (ref 5–40)

## 2020-04-27 LAB — VITAMIN D 25 HYDROXY (VIT D DEFICIENCY, FRACTURES): Vit D, 25-Hydroxy: 36.4 ng/mL (ref 30.0–100.0)

## 2020-04-27 LAB — PSA: Prostate Specific Ag, Serum: 0.4 ng/mL (ref 0.0–4.0)

## 2020-04-28 ENCOUNTER — Encounter: Payer: Self-pay | Admitting: Family Medicine

## 2020-04-28 ENCOUNTER — Other Ambulatory Visit: Payer: Self-pay

## 2020-04-28 ENCOUNTER — Other Ambulatory Visit: Payer: Self-pay | Admitting: "Endocrinology

## 2020-05-01 MED ORDER — VITAMIN D3 125 MCG (5000 UT) PO CAPS: 1.0000 | ORAL_CAPSULE | Freq: Every day | ORAL | 0 refills | Status: DC

## 2020-05-01 MED ORDER — METFORMIN HCL 1000 MG PO TABS
500.0000 mg | ORAL_TABLET | Freq: Two times a day (BID) | ORAL | 2 refills | Status: DC
Start: 2020-05-01 — End: 2021-07-25

## 2020-05-01 MED ORDER — VITAMIN D3 125 MCG (5000 UT) PO CAPS
1.0000 | ORAL_CAPSULE | Freq: Every day | ORAL | 0 refills | Status: DC
Start: 2020-05-01 — End: 2021-01-30

## 2020-05-04 DIAGNOSIS — Z6841 Body Mass Index (BMI) 40.0 and over, adult: Secondary | ICD-10-CM | POA: Diagnosis not present

## 2020-05-04 DIAGNOSIS — M1712 Unilateral primary osteoarthritis, left knee: Secondary | ICD-10-CM | POA: Diagnosis not present

## 2020-05-24 ENCOUNTER — Other Ambulatory Visit: Payer: Self-pay | Admitting: "Endocrinology

## 2020-05-25 DIAGNOSIS — B351 Tinea unguium: Secondary | ICD-10-CM | POA: Diagnosis not present

## 2020-05-25 DIAGNOSIS — M79676 Pain in unspecified toe(s): Secondary | ICD-10-CM | POA: Diagnosis not present

## 2020-05-25 DIAGNOSIS — E1142 Type 2 diabetes mellitus with diabetic polyneuropathy: Secondary | ICD-10-CM | POA: Diagnosis not present

## 2020-06-03 ENCOUNTER — Other Ambulatory Visit: Payer: Self-pay | Admitting: Family Medicine

## 2020-07-01 ENCOUNTER — Other Ambulatory Visit: Payer: Self-pay | Admitting: Family Medicine

## 2020-07-16 ENCOUNTER — Other Ambulatory Visit: Payer: Self-pay | Admitting: Family Medicine

## 2020-07-17 MED ORDER — FUROSEMIDE 80 MG PO TABS
80.0000 mg | ORAL_TABLET | Freq: Two times a day (BID) | ORAL | 0 refills | Status: DC
Start: 2020-07-17 — End: 2020-10-30

## 2020-08-03 DIAGNOSIS — E1142 Type 2 diabetes mellitus with diabetic polyneuropathy: Secondary | ICD-10-CM | POA: Diagnosis not present

## 2020-08-03 DIAGNOSIS — M1712 Unilateral primary osteoarthritis, left knee: Secondary | ICD-10-CM | POA: Diagnosis not present

## 2020-08-03 DIAGNOSIS — B351 Tinea unguium: Secondary | ICD-10-CM | POA: Diagnosis not present

## 2020-08-03 DIAGNOSIS — Z6841 Body Mass Index (BMI) 40.0 and over, adult: Secondary | ICD-10-CM | POA: Diagnosis not present

## 2020-08-04 ENCOUNTER — Telehealth: Payer: Self-pay

## 2020-08-04 DIAGNOSIS — N1831 Chronic kidney disease, stage 3a: Secondary | ICD-10-CM

## 2020-08-04 DIAGNOSIS — Z794 Long term (current) use of insulin: Secondary | ICD-10-CM

## 2020-08-04 MED ORDER — LANTUS SOLOSTAR 100 UNIT/ML ~~LOC~~ SOPN: 55.0000 [IU] | PEN_INJECTOR | Freq: Every day | SUBCUTANEOUS | 1 refills | Status: DC

## 2020-08-04 NOTE — Telephone Encounter (Signed)
No samples available  Sent refill to [facility]

## 2020-08-13 ENCOUNTER — Other Ambulatory Visit: Payer: Self-pay | Admitting: Family Medicine

## 2020-08-13 DIAGNOSIS — M1039 Gout due to renal impairment, multiple sites: Secondary | ICD-10-CM

## 2020-08-15 ENCOUNTER — Encounter: Payer: Self-pay | Admitting: *Deleted

## 2020-08-16 ENCOUNTER — Other Ambulatory Visit: Payer: BC Managed Care – PPO

## 2020-08-16 DIAGNOSIS — Z20822 Contact with and (suspected) exposure to covid-19: Secondary | ICD-10-CM | POA: Diagnosis not present

## 2020-08-17 LAB — SARS-COV-2, NAA 2 DAY TAT

## 2020-08-17 LAB — NOVEL CORONAVIRUS, NAA: SARS-CoV-2, NAA: NOT DETECTED

## 2020-10-12 DIAGNOSIS — B351 Tinea unguium: Secondary | ICD-10-CM | POA: Diagnosis not present

## 2020-10-12 DIAGNOSIS — M79676 Pain in unspecified toe(s): Secondary | ICD-10-CM | POA: Diagnosis not present

## 2020-10-24 ENCOUNTER — Ambulatory Visit: Payer: BC Managed Care – PPO | Admitting: Family Medicine

## 2020-10-29 ENCOUNTER — Other Ambulatory Visit: Payer: Self-pay | Admitting: Family Medicine

## 2020-11-28 ENCOUNTER — Ambulatory Visit: Payer: BC Managed Care – PPO | Admitting: Family Medicine

## 2020-11-28 ENCOUNTER — Encounter: Payer: Self-pay | Admitting: Family Medicine

## 2020-11-28 ENCOUNTER — Other Ambulatory Visit: Payer: Self-pay

## 2020-11-28 VITALS — BP 136/76 | HR 70 | Temp 97.1°F | Ht 72.0 in | Wt 387.4 lb

## 2020-11-28 DIAGNOSIS — I152 Hypertension secondary to endocrine disorders: Secondary | ICD-10-CM

## 2020-11-28 DIAGNOSIS — N1831 Chronic kidney disease, stage 3a: Secondary | ICD-10-CM

## 2020-11-28 DIAGNOSIS — E1159 Type 2 diabetes mellitus with other circulatory complications: Secondary | ICD-10-CM

## 2020-11-28 DIAGNOSIS — E1169 Type 2 diabetes mellitus with other specified complication: Secondary | ICD-10-CM | POA: Diagnosis not present

## 2020-11-28 DIAGNOSIS — E785 Hyperlipidemia, unspecified: Secondary | ICD-10-CM

## 2020-11-28 DIAGNOSIS — E1122 Type 2 diabetes mellitus with diabetic chronic kidney disease: Secondary | ICD-10-CM | POA: Diagnosis not present

## 2020-11-28 DIAGNOSIS — Z794 Long term (current) use of insulin: Secondary | ICD-10-CM | POA: Diagnosis not present

## 2020-11-28 LAB — BAYER DCA HB A1C WAIVED: HB A1C (BAYER DCA - WAIVED): 11.7 % — ABNORMAL HIGH (ref <7.0)

## 2020-11-28 MED ORDER — OZEMPIC (0.25 OR 0.5 MG/DOSE) 2 MG/1.5ML ~~LOC~~ SOPN: 0.5000 mg | PEN_INJECTOR | SUBCUTANEOUS | 2 refills | Status: DC

## 2020-11-28 NOTE — Progress Notes (Signed)
Subjective: CC:CKD, HTN, DM PCP: Jon Norlander, DO KPT:WSFKCLE Jon Moore is a 60 y.o. male presenting to clinic today for:  1. Type 2 Diabetes with hypertension, hyperlipidemia and CKD:  History: Previously treated with [place] and he did well on this, insurance stopped paying for it.  Also previously on Invokana but this was discontinued by endocrinology due to risk vs benefit.  Managed by Dr. Dorris Moore, endocrinology.  Advised to gradually increase insulin last visit by 1 unit every 2 days for fasting blood sugar above 150.  He was also advised to follow-up with endocrinology, which does not appear that he is done.  He was continued on his Metformin, Azor, Catapres and Crestor.  He admits that he has not been very strict on his diet and blood sugars have been running anywhere between 180s to 200s fasting.  He has not followed up with his endocrinologist because the front office staff "seems not coordinated" and he was unable to get his labs prior to his visit there.  He denies any blurred vision, chest pain, shortness of breath, polydipsia or polyuria.  No new sensory changes.  Urine output is good.  Last eye exam: needs Last foot exam: needs Last A1c:  Lab Results  Component Value Date   HGBA1C 9.4 (H) 04/26/2020   Nephropathy screen indicated?: on ARB Last flu, zoster and/or pneumovax:  Immunization History  Administered Date(s) Administered  . Influenza,inj,Quad PF,6+ Mos 07/19/2014, 06/14/2016, 07/15/2017, 07/29/2018, 05/26/2019  . Influenza-Unspecified 07/19/2013  . Pneumococcal Conjugate-13 04/19/2014  . Pneumococcal Polysaccharide-23 04/29/2018  . Td 05/13/2011  . Tdap 05/13/2011    ROS: Per HPI  No Known Allergies Past Medical History:  Diagnosis Date  . Diabetes mellitus   . Frozen shoulder syndrome 2013   Right shoulder.  Has had PT.  Mar[place] Kitchen Hypercholesterolemia   . Hypertension     Current Outpatient Medications:  .  allopurinol (ZYLOPRIM) 100 MG tablet, Take 1/2  (one-half) tablet by mouth once daily, Disp: 45 tablet, Rfl: 0 .  amLODipine-olmesartan (AZOR) 10-40 MG tablet, Take 1 tablet by mouth once daily, Disp: 90 tablet, Rfl: 1 .  blood glucose meter kit and supplies, Dispense based on patient and insurance preference. Use up to two times daily as directed. (FOR ICD-10 E11.65), Disp: 1 each, Rfl: 5 .  carvedilol (COREG) 25 MG tablet, TAKE 1 TABLET BY MOUTH TWICE DAILY WITH A MEAL, Disp: 180 tablet, Rfl: 1 .  Cholecalciferol (VITAMIN D3) 125 MCG (5000 UT) CAPS, Take 1 capsule (5,000 Units total) by mouth daily., Disp: 90 capsule, Rfl: 0 .  cloNIDine (CATAPRES) 0.1 MG tablet, Take 1 tablet by mouth twice daily, Disp: 180 tablet, Rfl: 1 .  CONTOUR NEXT TEST test strip, USE TO CHECK SUGAR UP TO TWICE DAILY. PATIENT NEEDS APPOINTMENT, Disp: 100 strip, Rfl: 5 .  diclofenac sodium (VOLTAREN) 1 % GEL, Apply 4 g topically 4 (four) times daily., Disp: , Rfl:  .  furosemide (LASIX) 80 MG tablet, Take 1 tablet by mouth twice daily, Disp: 180 tablet, Rfl: 0 .  insulin glargine (LANTUS SOLOSTAR) 100 UNIT/ML Solostar Pen, Inject 55-70 Units into the skin at bedtime., Disp: 45 mL, Rfl: 1 .  Insulin Pen Needle 29G X 12.7MM MISC, Use to inject Lantus once qd, Disp: 100 each, Rfl: 3 .  metFORMIN (GLUCOPHAGE) 1000 MG tablet, Take 0.5 tablets (500 mg total) by mouth 2 (two) times daily with a meal., Disp: 180 tablet, Rfl: 2 .  mometasone (NASONEX) 50 MCG/ACT nasal spray, Use  2 spray(s) in each nostril once daily, Disp: 51 g, Rfl: 0 .  rosuvastatin (CRESTOR) 5 MG tablet, Take 1 tablet by mouth once daily, Disp: 90 tablet, Rfl: 1 Social History   Socioeconomic History  . Marital status: Married    Spouse name: Not on file  . Number of children: 1  . Years of education: Not on file  . Highest education level: Not on file  Occupational History  . Occupation: [employer]    Comment: Engineer, building services   Tobacco Use  . Smoking status: Never Smoker  . Smokeless  tobacco: Never Used  Vaping Use  . Vaping Use: Never used  Substance and Sexual Activity  . Alcohol use: No  . Drug use: No  . Sexual activity: Not on file  Other Topics Concern  . Not on file  Social History Narrative  . Not on file   Social Determinants of Health   Financial Resource Strain: Not on file  Food Insecurity: Not on file  Transportation Needs: Not on file  Physical Activity: Not on file  Stress: Not on file  Social Connections: Not on file  Intimate Partner Violence: Not on file   Family History  Problem Relation Age of Onset  . Diabetes Mother   . Heart failure Mother   . Hypertension Father   . Kidney disease Father   . Cancer Sister   . Hodgkin's lymphoma Sister   . Hypertension Brother   . Hypertension Brother   . Hypertension Brother   . Colon cancer Neg Hx     Objective: Office vital signs reviewed. BP 136/76   Pulse 70   Temp (!) 97.1 F (36.2 C)   Ht 6' (1.829 m)   Wt (!) 387 lb 6.4 oz (175.7 kg)   SpO2 97%   BMI 52.54 kg/m   Physical Examination:  General: Awake, alert, morbidly obese, No acute distress HEENT: Normal; sclera white; no carotid bruits Cardio: regular rate and rhythm, S1S2 heard, no murmurs appreciated Pulm: clear to auscultation bilaterally, no wheezes, rhonchi or rales; normal work of breathing on room air   Assessment/ Plan: 60 y.o. male   Type 2 diabetes mellitus with stage 3a chronic kidney disease, with long-term current use of insulin ([place]) - Plan: Renal Function Panel, Bayer DCA Hb A1c Waived, Semaglutide,0.25 or 0.5MG/DOS, (OZEMPIC, 0.25 OR 0.5 MG/DOSE,) 2 MG/1.5ML SOPN  Hyperlipidemia associated with type 2 diabetes mellitus ([place])  Hypertension associated with diabetes ([place])  Sugar grossly uncontrolled A1c up to 11.7 today.  I had a very frank conversation with Jon Moore today.  His risk of progressive kidney disease, cardiovascular complications and other pathology associated with uncontrolled diabetes is  quite high.  I worry about his health and risk of death at this point.  He is not only morbidly obese but again has the uncontrolled sugar as above.  I am adding Ozempic to his regimen.  A sample was provided.  He will start 0.25 mg each week and then will advance to 0.5 mg each week after 1 month.  Prescription has been sent and he will pick this up after he has completed the sample.  He will advance his Lantus up to 65 units nightly.  Continue to monitor blood sugars.  Would like him to follow-up with [name] in 1 month for recheck of blood sugar and to help with patient assistance if needed at that time.  If able to get [facility], would like to have this for the  patient to simplify regimen.    We will see him back in 3 months if he has not scheduled an appointment with his endocrinologist by then.  Plan for foot exam at that visit.  Needs to get diabetic eye exam done.  No orders of the defined types were placed in this encounter.  No orders of the defined types were placed in this encounter.    Jon Norlander, DO [place] [phone]

## 2020-11-28 NOTE — Patient Instructions (Signed)
Sugar is VERY uncontrolled.  YOU HAVE TO GET SERIOUS ABOUT LOWERING SUGAR AND CARBS IN YOUR DIET.  Your risk of death from stroke/ heart attack/ kidney disease is HIGH.  Ozempic added for weekly injection.  Start with 0.25mg  every 7 days x1 month, then increase to 0.5mg  injected weekly.  Increase Lantus to 65 units daily.  If your FASTING blood sugar is above 150 for 2 consecutive days, increase by 1 unit of your long acting insulin.  Example: Day 1: Blood sugar was 159 Day 2: Blood sugar was 205  Increase Lantus to 66 units Day 3: Blood sugar is 202 Day 4: Blood sugars 168  Increase Lantus to 67 units Day 5: Blood sugar is 105 Day 6: Blood sugars 145  Stick with 67 units.  Do not increase.

## 2020-11-29 LAB — RENAL FUNCTION PANEL
Albumin: 4 g/dL (ref 3.8–4.9)
BUN/Creatinine Ratio: 13 (ref 10–24)
BUN: 21 mg/dL (ref 8–27)
CO2: 24 mmol/L (ref 20–29)
Calcium: 9.4 mg/dL (ref 8.6–10.2)
Chloride: 101 mmol/L (ref 96–106)
Creatinine, Ser: 1.6 mg/dL — ABNORMAL HIGH (ref 0.76–1.27)
Glucose: 112 mg/dL — ABNORMAL HIGH (ref 65–99)
Phosphorus: 3.6 mg/dL (ref 2.8–4.1)
Potassium: 4.3 mmol/L (ref 3.5–5.2)
Sodium: 143 mmol/L (ref 134–144)
eGFR: 49 mL/min/{1.73_m2} — ABNORMAL LOW (ref >59)

## 2020-12-03 ENCOUNTER — Encounter: Payer: Self-pay | Admitting: Family Medicine

## 2020-12-03 ENCOUNTER — Other Ambulatory Visit: Payer: Self-pay | Admitting: Family Medicine

## 2020-12-05 ENCOUNTER — Other Ambulatory Visit: Payer: Self-pay | Admitting: Family Medicine

## 2020-12-05 DIAGNOSIS — Z794 Long term (current) use of insulin: Secondary | ICD-10-CM

## 2020-12-05 DIAGNOSIS — N1831 Chronic kidney disease, stage 3a: Secondary | ICD-10-CM

## 2020-12-05 DIAGNOSIS — E1122 Type 2 diabetes mellitus with diabetic chronic kidney disease: Secondary | ICD-10-CM

## 2020-12-07 ENCOUNTER — Telehealth: Payer: Self-pay

## 2020-12-07 NOTE — Telephone Encounter (Signed)
Left detailed message on pharmacy VM.

## 2020-12-07 NOTE — Telephone Encounter (Signed)
Caitlyn from [employer] called stating that pts insurance does not cover Lantus but will cover Semglee.

## 2020-12-07 NOTE — Telephone Encounter (Signed)
DR G okay the switch already called over to [facility]

## 2020-12-07 NOTE — Telephone Encounter (Signed)
Ok to change. Same instructions as Lantus.

## 2020-12-08 ENCOUNTER — Other Ambulatory Visit: Payer: Self-pay | Admitting: Family Medicine

## 2020-12-08 DIAGNOSIS — M1039 Gout due to renal impairment, multiple sites: Secondary | ICD-10-CM

## 2020-12-11 ENCOUNTER — Telehealth: Payer: Self-pay | Admitting: *Deleted

## 2020-12-11 ENCOUNTER — Other Ambulatory Visit: Payer: Self-pay | Admitting: Family Medicine

## 2020-12-11 DIAGNOSIS — Z794 Long term (current) use of insulin: Secondary | ICD-10-CM

## 2020-12-11 DIAGNOSIS — E1122 Type 2 diabetes mellitus with diabetic chronic kidney disease: Secondary | ICD-10-CM

## 2020-12-11 NOTE — Telephone Encounter (Signed)
Lantus SoloStar 100UNIT/ML pen-injectors Key: [id number] Sent to plan

## 2020-12-12 ENCOUNTER — Other Ambulatory Visit: Payer: Self-pay | Admitting: Family Medicine

## 2020-12-12 MED ORDER — INSULIN GLARGINE 100 UNIT/ML ~~LOC~~ SOLN: 55.0000 [IU] | Freq: Every day | SUBCUTANEOUS | 3 refills | Status: DC

## 2020-12-12 NOTE — Telephone Encounter (Signed)
This was already switched to semglee. Notes show VM left with pharmacy but for some reason, not reflected in EMR.  EMR updated.

## 2020-12-12 NOTE — Telephone Encounter (Signed)
Lantus - Denied on April 25

## 2020-12-26 ENCOUNTER — Ambulatory Visit: Payer: BC Managed Care – PPO | Admitting: Pharmacist

## 2020-12-26 ENCOUNTER — Encounter: Payer: Self-pay | Admitting: Pharmacist

## 2020-12-26 ENCOUNTER — Other Ambulatory Visit: Payer: Self-pay

## 2020-12-26 DIAGNOSIS — E119 Type 2 diabetes mellitus without complications: Secondary | ICD-10-CM

## 2020-12-26 MED ORDER — SOLIQUA 100-33 UNT-MCG/ML ~~LOC~~ SOPN: 60.0000 [IU] | PEN_INJECTOR | Freq: Every day | SUBCUTANEOUS | 11 refills | Status: DC

## 2020-12-26 NOTE — Progress Notes (Signed)
    12/26/2020 Name: Jon Moore MRN: [id number] DOB: 1961/07/26   S:  59 yoM Presents for diabetes evaluation, education, and management Patient was referred and last seen by Primary Care Provider on 11/28/20.  Per PCP Notes, History: Previously treated with [place] and he did well on this, insurance stopped paying for it.  Also previously on Invokana but this was discontinued by endocrinology due to risk vs benefit.  Managed by Dr. Fransico Him, endocrinology.  Insurance coverage/medication affordability: BCBS commercial  Patient reports adherence with medications. . Current diabetes medications include: ozempic, metformin, insulin glargine . Current hypertension medications include: amlodipine/olmesartan, coreg, clonidine Goal 130/80 . Current hyperlipidemia medications include: rosuvastatin  Patient denies hypoglycemic events.   Patient reported dietary habits: Eats 3 meals/day Discussed meal planning options and Plate method for healthy eating . Avoid sugary drinks and desserts . Incorporate balanced protein, non starchy veggies, 1 serving of carbohydrate with each meal . Increase water intake . Increase physical activity as able    O:  Lab Results  Component Value Date   HGBA1C 11.7 (H) 11/28/2020   Lipid Panel     Component Value Date/Time   CHOL 153 04/26/2020 0822   TRIG 238 (H) 04/26/2020 0822   HDL 27 (L) 04/26/2020 0822   CHOLHDL 5.7 (H) 04/26/2020 0822   LDLCALC 86 04/26/2020 0822    Home fasting blood sugars: 180-200  2 hour post-meal/random blood sugars: n/a.   Clinical Atherosclerotic Cardiovascular Disease (ASCVD): No   The 10-year ASCVD risk score Denman George DC Jr., et al., 2013) is: 29.1%   Values used to calculate the score:     Age: 36 years     Sex: Male     Is Non-Hispanic African American: Yes     Diabetic: Yes     Tobacco smoker: No     Systolic Blood Pressure: 136 mmHg     Is BP treated: Yes     HDL Cholesterol: 27 mg/dL     Total Cholesterol: 153  mg/dL    A/P:  Diabetes G8QP currently uncontrolled. Patient is adherent with medication. Control is suboptimal due to recent steroid injections of the knee (every 3 months).  -Patient would like to transition back to [facility] for ease of use--will start at 50 units nightly and titrate up as needed (appears preferred level 1 with PA on insurance)  copay card given  Samples given  Denies history of thyroid cancer  -Continue metformin  -Discontinue Ozempic and Semglee (insulin glargine)  -Will consider SGLT2 for CKD at next visit  -Extensively discussed pathophysiology of diabetes, recommended lifestyle interventions, dietary effects on blood sugar control  -Counseled on s/sx of and management of hypoglycemia  -Next A1C anticipated 02/2021.  Written patient instructions provided.  Total time in face to face counseling 20 minutes.   Follow up Pharmacist Clinic Visit ON 01/30/21.    Kieth Brightly, PharmD, BCPS Clinical Pharmacist, [facility] Family Medicine [facility]  II Phone [phone]

## 2020-12-28 DIAGNOSIS — B351 Tinea unguium: Secondary | ICD-10-CM | POA: Diagnosis not present

## 2020-12-28 DIAGNOSIS — M79676 Pain in unspecified toe(s): Secondary | ICD-10-CM | POA: Diagnosis not present

## 2020-12-31 ENCOUNTER — Other Ambulatory Visit: Payer: Self-pay | Admitting: Family Medicine

## 2021-01-21 ENCOUNTER — Other Ambulatory Visit: Payer: Self-pay | Admitting: Family Medicine

## 2021-01-30 ENCOUNTER — Ambulatory Visit (INDEPENDENT_AMBULATORY_CARE_PROVIDER_SITE_OTHER): Payer: BC Managed Care – PPO | Admitting: Pharmacist

## 2021-01-30 DIAGNOSIS — E1165 Type 2 diabetes mellitus with hyperglycemia: Secondary | ICD-10-CM | POA: Diagnosis not present

## 2021-01-30 MED ORDER — VITAMIN D3 125 MCG (5000 UT) PO CAPS: 1.0000 | ORAL_CAPSULE | Freq: Every day | ORAL | 0 refills | Status: DC

## 2021-01-30 MED ORDER — COLCHICINE 0.6 MG PO TABS: ORAL_TABLET | ORAL | 0 refills | Status: DC

## 2021-01-30 NOTE — Progress Notes (Signed)
    01/30/2021 Name: Jon Moore MRN: [id number] DOB: February 07, 1961   S:  59 yoM Presents for diabetes evaluation, education, and management Patient was referred and last seen by Primary Care Provider on 11/28/20.  Per PCP Notes, History: Previously treated with [place] and he did well on this, insurance stopped paying for it.  Also previously on Invokana but this was discontinued by endocrinology due to risk vs benefit.  Managed by Dr. Fransico Him, endocrinology.  Insurance coverage/medication affordability: BCBS commercial  Patient reports adherence with medications. Current diabetes medications include: soliquia, metformin Current hypertension medications include: amlodipine/olmesartan, coreg, clonidine Goal 130/80 Current hyperlipidemia medications include: rosuvastatin   Patient denies hypoglycemic events.   Patient reported dietary habits: Eats 3 meals/day Reports he is eating much better  Discussed meal planning options and Plate method for healthy eating Avoid sugary drinks and desserts Incorporate balanced protein, non starchy veggies, 1 serving of carbohydrate with each meal Increase water intake Increase physical activity as able  Patient-reported exercise habits: n/a; walks with cane, difficulty   O:  Lab Results  Component Value Date   HGBA1C 11.7 (H) 11/28/2020    Lipid Panel     Component Value Date/Time   CHOL 153 04/26/2020 0822   TRIG 238 (H) 04/26/2020 0822   HDL 27 (L) 04/26/2020 0822   CHOLHDL 5.7 (H) 04/26/2020 0822   LDLCALC 86 04/26/2020 0822     Home fasting blood sugars: 80-90  2 hour post-meal/random blood sugars: n/a.    Clinical Atherosclerotic Cardiovascular Disease (ASCVD): No   The 10-year ASCVD risk score Denman George DC Jr., et al., 2013) is: 29.1%   Values used to calculate the score:     Age: 78 years     Sex: Male     Is Non-Hispanic African American: Yes     Diabetic: Yes     Tobacco smoker: No     Systolic Blood Pressure: 136 mmHg      Is BP treated: Yes     HDL Cholesterol: 27 mg/dL     Total Cholesterol: 153 mg/dL    A/P:  Diabetes P5FF currently uncontrolled, but vastly improved. Patient is adherent with medication. Control is suboptimal due to recent steroid injections of the knee (every 3 months).  FBG 92 in office -->ranges from 72-110  Decreased to 55 units soliqua  Colchicine for gout; discussed with PCP and sent for cosign  Pen needles given  Continue metformin; GFR 49  -Extensively discussed pathophysiology of diabetes, recommended lifestyle interventions, dietary effects on blood sugar control  -Counseled on s/sx of and management of hypoglycemia  -Next A1C anticipated 1 month.   Written patient instructions provided.  Total time in face to face counseling 25 minutes.   Kieth Brightly, PharmD, BCPS Clinical Pharmacist, [facility] Family Medicine [facility]  II Phone [phone]

## 2021-02-15 ENCOUNTER — Other Ambulatory Visit: Payer: Self-pay | Admitting: Family Medicine

## 2021-02-15 DIAGNOSIS — M1039 Gout due to renal impairment, multiple sites: Secondary | ICD-10-CM

## 2021-02-27 ENCOUNTER — Other Ambulatory Visit: Payer: Self-pay

## 2021-02-27 ENCOUNTER — Encounter: Payer: Self-pay | Admitting: Family Medicine

## 2021-02-27 ENCOUNTER — Ambulatory Visit (INDEPENDENT_AMBULATORY_CARE_PROVIDER_SITE_OTHER): Payer: BC Managed Care – PPO | Admitting: Family Medicine

## 2021-02-27 VITALS — BP 140/84 | HR 71 | Temp 97.5°F | Ht 72.0 in | Wt 377.0 lb

## 2021-02-27 DIAGNOSIS — E1169 Type 2 diabetes mellitus with other specified complication: Secondary | ICD-10-CM

## 2021-02-27 DIAGNOSIS — E1122 Type 2 diabetes mellitus with diabetic chronic kidney disease: Secondary | ICD-10-CM | POA: Diagnosis not present

## 2021-02-27 DIAGNOSIS — N1831 Chronic kidney disease, stage 3a: Secondary | ICD-10-CM

## 2021-02-27 DIAGNOSIS — Z794 Long term (current) use of insulin: Secondary | ICD-10-CM | POA: Diagnosis not present

## 2021-02-27 DIAGNOSIS — E1159 Type 2 diabetes mellitus with other circulatory complications: Secondary | ICD-10-CM | POA: Diagnosis not present

## 2021-02-27 DIAGNOSIS — E785 Hyperlipidemia, unspecified: Secondary | ICD-10-CM

## 2021-02-27 DIAGNOSIS — I152 Hypertension secondary to endocrine disorders: Secondary | ICD-10-CM

## 2021-02-27 LAB — BAYER DCA HB A1C WAIVED: HB A1C (BAYER DCA - WAIVED): 7.3 % — ABNORMAL HIGH (ref <7.0)

## 2021-02-27 MED ORDER — SOLIQUA 100-33 UNT-MCG/ML ~~LOC~~ SOPN: 50.0000 [IU] | PEN_INJECTOR | Freq: Every day | SUBCUTANEOUS | 11 refills | Status: DC

## 2021-02-27 NOTE — Patient Instructions (Signed)
A1c has come down tremendously!  Great Job!!!  I'd like you to adjust your shot up by 1 click and that should get you to goal!

## 2021-02-27 NOTE — Progress Notes (Signed)
Subjective: CC: DM PCP: [name], DO XIP:[id number] Ramson is a 60 y.o. male presenting to clinic today for:  1. Type 2 Diabetes with hypertension, hyperlipidemia w/ CKD3a:  Patient has been doing extremely well with his [place].  He is currently injecting 50 units.  Blood sugars on average are running somewhere in the 90s to 100s with postprandials around the same.  He does not report any hypoglycemic episodes.  He is compliant with all other medications.  He feels much better and has lost a little bit of weight on the [place] which he is happy about.  He admits to splurging during a few holidays in the last month or so but otherwise has been trying to cut back on carbs  Last eye exam: UTD Last foot exam: UTD Last A1c:  Lab Results  Component Value Date   HGBA1C 11.7 (H) 11/28/2020   Nephropathy screen indicated?: UTD Last flu, zoster and/or pneumovax:  Immunization History  Administered Date(s) Administered   Influenza,inj,Quad PF,6+ Mos 07/19/2014, 06/14/2016, 07/15/2017, 07/29/2018, 05/26/2019   Influenza-Unspecified 07/19/2013   Pneumococcal Conjugate-13 04/19/2014   Pneumococcal Polysaccharide-23 04/29/2018   Td 05/13/2011   Tdap 05/13/2011    ROS: Per HPI  No Known Allergies Past Medical History:  Diagnosis Date   Diabetes mellitus    Frozen shoulder syndrome 2013   Right shoulder.  Has had PT.   Hypercholesterolemia    Hypertension     Current Outpatient Medications:    allopurinol (ZYLOPRIM) 100 MG tablet, Take 1/2 (one-half) tablet by mouth once daily, Disp: 45 tablet, Rfl: 0   amLODipine-olmesartan (AZOR) 10-40 MG tablet, Take 1 tablet by mouth once daily, Disp: 90 tablet, Rfl: 0   blood glucose meter kit and supplies, Dispense based on patient and insurance preference. Use up to two times daily as directed. (FOR ICD-10 E11.65), Disp: 1 each, Rfl: 5   carvedilol (COREG) 25 MG tablet, TAKE 1 TABLET BY MOUTH TWICE DAILY WITH A MEAL, Disp: 180 tablet, Rfl:  0   Cholecalciferol (VITAMIN D3) 125 MCG (5000 UT) CAPS, Take 1 capsule (5,000 Units total) by mouth daily., Disp: 90 capsule, Rfl: 0   cloNIDine (CATAPRES) 0.1 MG tablet, Take 1 tablet by mouth twice daily, Disp: 180 tablet, Rfl: 0   colchicine 0.6 MG tablet, Take 1.2 mg (two 0.6-mg tablets) orally at the first sign of a flare followed by 0.6 mg (1 tablet) one hour later; MAX 1.8 mg over 1 hour., Disp: 6 tablet, Rfl: 0   CONTOUR NEXT TEST test strip, USE TO CHECK SUGAR UP TO TWICE DAILY. PATIENT NEEDS APPOINTMENT, Disp: 100 strip, Rfl: 5   diclofenac sodium (VOLTAREN) 1 % GEL, Apply 4 g topically 4 (four) times daily., Disp: , Rfl:    furosemide (LASIX) 80 MG tablet, Take 1 tablet by mouth twice daily, Disp: 180 tablet, Rfl: 0   Insulin Glargine-Lixisenatide (SOLIQUA) 100-33 UNT-MCG/ML SOPN, Inject 60 Units into the skin daily., Disp: 3 mL, Rfl: 11   Insulin Pen Needle 29G X 12.7MM MISC, Use to inject Lantus once qd, Disp: 100 each, Rfl: 3   metFORMIN (GLUCOPHAGE) 1000 MG tablet, Take 0.5 tablets (500 mg total) by mouth 2 (two) times daily with a meal., Disp: 180 tablet, Rfl: 2   mometasone (NASONEX) 50 MCG/ACT nasal spray, Use 2 spray(s) in each nostril once daily, Disp: 17 g, Rfl: 3   rosuvastatin (CRESTOR) 5 MG tablet, Take 1 tablet by mouth once daily, Disp: 90 tablet, Rfl: 1 Social History  Socioeconomic History   Marital status: Married    Spouse name: Not on file   Number of children: 1   Years of education: Not on file   Highest education level: Not on file  Occupational History   Occupation: [employer]    Comment: Engineer, building services   Tobacco Use   Smoking status: Never   Smokeless tobacco: Never  Vaping Use   Vaping Use: Never used  Substance and Sexual Activity   Alcohol use: No   Drug use: No   Sexual activity: Not on file  Other Topics Concern   Not on file  Social History Narrative   Not on file   Social Determinants of Health   Financial Resource  Strain: Not on file  Food Insecurity: Not on file  Transportation Needs: Not on file  Physical Activity: Not on file  Stress: Not on file  Social Connections: Not on file  Intimate Partner Violence: Not on file   Family History  Problem Relation Age of Onset   Diabetes Mother    Heart failure Mother    Hypertension Father    Kidney disease Father    Cancer Sister    Hodgkin's lymphoma Sister    Hypertension Brother    Hypertension Brother    Hypertension Brother    Colon cancer Neg Hx     Objective: Office vital signs reviewed. BP 140/84   Pulse 71   Temp (!) 97.5 F (36.4 C)   Ht 6' (1.829 m)   Wt (!) 377 lb (171 kg)   SpO2 91%   BMI 51.13 kg/m   Physical Examination:  General: Awake, alert, well nourished, obese. No acute distress HEENT: Normal, sclera white Cardio: regular rate and rhythm, S1S2 heard, no murmurs appreciated Pulm: clear to auscultation bilaterally, no wheezes, rhonchi or rales; normal work of breathing on room air  Assessment/ Plan: 60 y.o. male   Type 2 diabetes mellitus with stage 3a chronic kidney disease, with long-term current use of insulin ([place]) - Plan: CMP14+EGFR, Bayer DCA Hb A1c Waived  Hyperlipidemia associated with type 2 diabetes mellitus ([place]) - Plan: CMP14+EGFR, Lipid Panel  Hypertension associated with diabetes ([place]) - Plan: CMP14+EGFR  Sugar now under much better control.  His A1c has dropped from 11.7 down to 7.3.  He is very nearly there and we discussed either cutting back more on intake of carbohydrates versus increasing his injection by 1 click.  He identified some areas of opportunity in his diet and therefore we will keep everything the same at 50 units injected each day and follow-up in 3 months.  Continue statin.  Check CMP, lipid panel  Blood pressure under control upon recheck.  No changes made  No orders of the defined types were placed in this encounter.  No orders of the defined types were placed in this  encounter.    [name], DO [place] [phone]

## 2021-02-28 LAB — CMP14+EGFR
ALT: 30 IU/L (ref 0–44)
AST: 20 IU/L (ref 0–40)
Albumin/Globulin Ratio: 1.1 — ABNORMAL LOW (ref 1.2–2.2)
Albumin: 4 g/dL (ref 3.8–4.9)
Alkaline Phosphatase: 129 IU/L — ABNORMAL HIGH (ref 44–121)
BUN/Creatinine Ratio: 13 (ref 10–24)
BUN: 23 mg/dL (ref 8–27)
Bilirubin Total: 0.4 mg/dL (ref 0.0–1.2)
CO2: 25 mmol/L (ref 20–29)
Calcium: 9.4 mg/dL (ref 8.6–10.2)
Chloride: 99 mmol/L (ref 96–106)
Creatinine, Ser: 1.73 mg/dL — ABNORMAL HIGH (ref 0.76–1.27)
Globulin, Total: 3.6 g/dL (ref 1.5–4.5)
Glucose: 81 mg/dL (ref 65–99)
Potassium: 4.2 mmol/L (ref 3.5–5.2)
Sodium: 142 mmol/L (ref 134–144)
Total Protein: 7.6 g/dL (ref 6.0–8.5)
eGFR: 45 mL/min/{1.73_m2} — ABNORMAL LOW (ref >59)

## 2021-02-28 LAB — LIPID PANEL
Chol/HDL Ratio: 4.9 ratio (ref 0.0–5.0)
Cholesterol, Total: 141 mg/dL (ref 100–199)
HDL: 29 mg/dL — ABNORMAL LOW (ref >39)
LDL Chol Calc (NIH): 79 mg/dL (ref 0–99)
Triglycerides: 192 mg/dL — ABNORMAL HIGH (ref 0–149)
VLDL Cholesterol Cal: 33 mg/dL (ref 5–40)

## 2021-03-07 ENCOUNTER — Other Ambulatory Visit: Payer: Self-pay | Admitting: Family Medicine

## 2021-03-15 DIAGNOSIS — M79676 Pain in unspecified toe(s): Secondary | ICD-10-CM | POA: Diagnosis not present

## 2021-03-15 DIAGNOSIS — B351 Tinea unguium: Secondary | ICD-10-CM | POA: Diagnosis not present

## 2021-04-28 ENCOUNTER — Other Ambulatory Visit: Payer: Self-pay | Admitting: Family Medicine

## 2021-05-24 DIAGNOSIS — B351 Tinea unguium: Secondary | ICD-10-CM | POA: Diagnosis not present

## 2021-05-24 DIAGNOSIS — M79676 Pain in unspecified toe(s): Secondary | ICD-10-CM | POA: Diagnosis not present

## 2021-05-30 ENCOUNTER — Encounter: Payer: Self-pay | Admitting: Family Medicine

## 2021-05-30 ENCOUNTER — Ambulatory Visit: Payer: BC Managed Care – PPO | Admitting: Family Medicine

## 2021-05-30 ENCOUNTER — Other Ambulatory Visit: Payer: Self-pay

## 2021-05-30 VITALS — BP 136/75 | HR 70 | Temp 97.4°F | Resp 20 | Ht 72.0 in | Wt 390.0 lb

## 2021-05-30 DIAGNOSIS — Z23 Encounter for immunization: Secondary | ICD-10-CM | POA: Diagnosis not present

## 2021-05-30 DIAGNOSIS — Z794 Long term (current) use of insulin: Secondary | ICD-10-CM | POA: Diagnosis not present

## 2021-05-30 DIAGNOSIS — N1831 Chronic kidney disease, stage 3a: Secondary | ICD-10-CM

## 2021-05-30 DIAGNOSIS — E1159 Type 2 diabetes mellitus with other circulatory complications: Secondary | ICD-10-CM | POA: Diagnosis not present

## 2021-05-30 DIAGNOSIS — E1169 Type 2 diabetes mellitus with other specified complication: Secondary | ICD-10-CM | POA: Diagnosis not present

## 2021-05-30 DIAGNOSIS — I152 Hypertension secondary to endocrine disorders: Secondary | ICD-10-CM

## 2021-05-30 DIAGNOSIS — E785 Hyperlipidemia, unspecified: Secondary | ICD-10-CM

## 2021-05-30 DIAGNOSIS — E1122 Type 2 diabetes mellitus with diabetic chronic kidney disease: Secondary | ICD-10-CM | POA: Diagnosis not present

## 2021-05-30 LAB — BAYER DCA HB A1C WAIVED: HB A1C (BAYER DCA - WAIVED): 7.2 % — ABNORMAL HIGH (ref 4.8–5.6)

## 2021-05-30 MED ORDER — SOLIQUA 100-33 UNT-MCG/ML ~~LOC~~ SOPN: 60.0000 [IU] | PEN_INJECTOR | Freq: Every day | SUBCUTANEOUS | 11 refills | Status: DC

## 2021-05-30 NOTE — Progress Notes (Signed)
Subjective: CC:DM PCP: [name], DO YPP:[id number] Daily is a 60 y.o. male presenting to clinic today for:  1. Type 2 Diabetes with hypertension, hyperlipidemia w/CKD3a:  Testing blood sugars and they typically running 90s to 110s in the morning.  The highest he seen is 120.  No hypoglycemic episodes.  He is compliant with Soliqua 60 units daily, metformin 500 mg twice daily, his blood pressure medication and low-dose Crestor.  He admits that he has not been exercising at all because his knee is due for shot and its been quite painful.  He is scheduling with his orthopedist and eating soon.  He is also been eating bread with every meal.  Last eye exam: needs Last foot exam: UTD Last A1c:  Lab Results  Component Value Date   HGBA1C 7.3 (H) 02/27/2021   Nephropathy screen indicated?: UTD Last flu, zoster and/or pneumovax:  Immunization History  Administered Date(s) Administered   Influenza,inj,Quad PF,6+ Mos 07/19/2014, 06/14/2016, 07/15/2017, 07/29/2018, 05/26/2019   Influenza-Unspecified 07/19/2013   Pneumococcal Conjugate-13 04/19/2014   Pneumococcal Polysaccharide-23 04/29/2018   Td 05/13/2011   Tdap 05/13/2011    ROS: Denies dizziness, LOC, polyuria, polydipsia, unintended weight loss/gain, foot ulcerations, numbness or tingling in extremities, shortness of breath or chest pain.    ROS: Per HPI  No Known Allergies Past Medical History:  Diagnosis Date   Diabetes mellitus    Frozen shoulder syndrome 2013   Right shoulder.  Has had PT.   Hypercholesterolemia    Hypertension     Current Outpatient Medications:    allopurinol (ZYLOPRIM) 100 MG tablet, Take 1/2 (one-half) tablet by mouth once daily, Disp: 45 tablet, Rfl: 0   amLODipine-olmesartan (AZOR) 10-40 MG tablet, Take 1 tablet by mouth once daily, Disp: 90 tablet, Rfl: 0   blood glucose meter kit and supplies, Dispense based on patient and insurance preference. Use up to two times daily as directed. (FOR  ICD-10 E11.65), Disp: 1 each, Rfl: 5   carvedilol (COREG) 25 MG tablet, TAKE 1 TABLET BY MOUTH TWICE DAILY WITH A MEAL, Disp: 180 tablet, Rfl: 0   Cholecalciferol (VITAMIN D3) 125 MCG (5000 UT) CAPS, Take 1 capsule (5,000 Units total) by mouth daily., Disp: 90 capsule, Rfl: 0   cloNIDine (CATAPRES) 0.1 MG tablet, Take 1 tablet by mouth twice daily, Disp: 180 tablet, Rfl: 0   colchicine 0.6 MG tablet, Take 1.2 mg (two 0.6-mg tablets) orally at the first sign of a flare followed by 0.6 mg (1 tablet) one hour later; MAX 1.8 mg over 1 hour., Disp: 6 tablet, Rfl: 0   CONTOUR NEXT TEST test strip, USE TO CHECK SUGAR UP TO TWICE DAILY. PATIENT NEEDS APPOINTMENT, Disp: 100 strip, Rfl: 5   diclofenac sodium (VOLTAREN) 1 % GEL, Apply 4 g topically 4 (four) times daily., Disp: , Rfl:    furosemide (LASIX) 80 MG tablet, Take 1 tablet by mouth twice daily, Disp: 180 tablet, Rfl: 0   Insulin Glargine-Lixisenatide (SOLIQUA) 100-33 UNT-MCG/ML SOPN, Inject 50 Units into the skin daily., Disp: 3 mL, Rfl: 11   Insulin Pen Needle 29G X 12.7MM MISC, Use to inject Lantus once qd, Disp: 100 each, Rfl: 3   metFORMIN (GLUCOPHAGE) 1000 MG tablet, Take 0.5 tablets (500 mg total) by mouth 2 (two) times daily with a meal., Disp: 180 tablet, Rfl: 2   mometasone (NASONEX) 50 MCG/ACT nasal spray, Use 2 spray(s) in each nostril once daily, Disp: 17 g, Rfl: 3   rosuvastatin (CRESTOR) 5 MG tablet,  Take 1 tablet by mouth once daily, Disp: 90 tablet, Rfl: 1 Social History   Socioeconomic History   Marital status: Married    Spouse name: Not on file   Number of children: 1   Years of education: Not on file   Highest education level: Not on file  Occupational History   Occupation: [employer]    Comment: Engineer, building services   Tobacco Use   Smoking status: Never   Smokeless tobacco: Never  Vaping Use   Vaping Use: Never used  Substance and Sexual Activity   Alcohol use: No   Drug use: No   Sexual activity: Not on  file  Other Topics Concern   Not on file  Social History Narrative   Not on file   Social Determinants of Health   Financial Resource Strain: Not on file  Food Insecurity: Not on file  Transportation Needs: Not on file  Physical Activity: Not on file  Stress: Not on file  Social Connections: Not on file  Intimate Partner Violence: Not on file   Family History  Problem Relation Age of Onset   Diabetes Mother    Heart failure Mother    Hypertension Father    Kidney disease Father    Cancer Sister    Hodgkin's lymphoma Sister    Hypertension Brother    Hypertension Brother    Hypertension Brother    Colon cancer Neg Hx     Objective: Office vital signs reviewed. BP 136/75   Pulse 70   Temp (!) 97.4 F (36.3 C) (Temporal)   Resp 20   Ht 6' (1.829 m)   Wt (!) 390 lb (176.9 kg)   SpO2 92%   BMI 52.89 kg/m   Physical Examination:  General: Morbidly obese male in no acute distress Cardio: regular rate and rhythm, S1S2 heard, no murmurs appreciated Pulm: clear to auscultation bilaterally, no wheezes, rhonchi or rales; normal work of breathing on room air MSK: Gait is antalgic and wide-based  Assessment/ Plan: 60 y.o. male   Type 2 diabetes mellitus with stage 3a chronic kidney disease, with long-term current use of insulin ([place]) - Plan: Renal Function Panel, Bayer DCA Hb A1c Waived  Hyperlipidemia associated with type 2 diabetes mellitus ([place])  Hypertension associated with diabetes ([place])  A1c is not at goal at 7.2 but has reduced slightly since last checkup.  We discussed consideration for advancement of his medication dose versus really being aggressive with lifestyle modification.  He prefers the latter.  He has made goals today of reducing consumption of bread to only 1 meal and 1 serving per day, increasing his exercise back to 30 minutes 5 days/week.  He needs to schedule diabetic eye exam  Continue statin.  Not yet due for fasting lipid panel  Blood pressure  is at goal.  No changes  No orders of the defined types were placed in this encounter.  No orders of the defined types were placed in this encounter.    [name], DO [place] [phone]

## 2021-05-30 NOTE — Patient Instructions (Signed)
SUGAR IS SUPER CLOSE. A1c of 7.2 lets me know your average blood sugars are actually running around 150s.  Want to get this down.  GOALS: A1c less 7.0 Walk 30 minutes 5 days per week Reduce bread intake to no more than 1 meal per day (and NO MORE than 1 serving)  Continue to monitor your blood sugars as we discussed.  Take your medication as directed.    Understanding your Hemoglobin A1c:     Diabetes Mellitus and Nutrition When you have diabetes (diabetes mellitus), it is very important to have healthy eating habits because your blood sugar (glucose) levels are greatly affected by what you eat and drink. Eating healthy foods in the appropriate amounts, at about the same times every day, can help you: Control your blood glucose. Lower your risk of heart disease. Improve your blood pressure. Reach or maintain a healthy weight.  Every person with diabetes is different, and each person has different needs for a meal plan. Your health care provider may recommend that you work with a diet and nutrition specialist (dietitian) to make a meal plan that is best for you. Your meal plan may vary depending on factors such as: The calories you need. The medicines you take. Your weight. Your blood glucose, blood pressure, and cholesterol levels. Your activity level. Other health conditions you have, such as heart or kidney disease.  How do carbohydrates affect me? Carbohydrates affect your blood glucose level more than any other type of food. Eating carbohydrates naturally increases the amount of glucose in your blood. Carbohydrate counting is a method for keeping track of how many carbohydrates you eat. Counting carbohydrates is important to keep your blood glucose at a healthy level, especially if you use insulin or take certain oral diabetes medicines. It is important to know how many carbohydrates you can safely have in each meal. This is different for every person. Your dietitian can help you  calculate how many carbohydrates you should have at each meal and for snack. Foods that contain carbohydrates include: Bread, cereal, rice, pasta, and crackers. Potatoes and corn. Peas, beans, and lentils. Milk and yogurt. Fruit and juice. Desserts, such as cakes, cookies, ice cream, and candy.  How does alcohol affect me? Alcohol can cause a sudden decrease in blood glucose (hypoglycemia), especially if you use insulin or take certain oral diabetes medicines. Hypoglycemia can be a life-threatening condition. Symptoms of hypoglycemia (sleepiness, dizziness, and confusion) are similar to symptoms of having too much alcohol. If your health care provider says that alcohol is safe for you, follow these guidelines: Limit alcohol intake to no more than 1 drink per day for nonpregnant women and 2 drinks per day for men. One drink equals 12 oz of beer, 5 oz of wine, or 1 oz of hard liquor. Do not drink on an empty stomach. Keep yourself hydrated with water, diet soda, or unsweetened iced tea. Keep in mind that regular soda, juice, and other mixers may contain a lot of sugar and must be counted as carbohydrates.  What are tips for following this plan? Reading food labels Start by checking the serving size on the label. The amount of calories, carbohydrates, fats, and other nutrients listed on the label are based on one serving of the food. Many foods contain more than one serving per package. Check the total grams (g) of carbohydrates in one serving. You can calculate the number of servings of carbohydrates in one serving by dividing the total carbohydrates by 15. For  example, if a food has 30 g of total carbohydrates, it would be equal to 2 servings of carbohydrates. Check the number of grams (g) of saturated and trans fats in one serving. Choose foods that have low or no amount of these fats. Check the number of milligrams (mg) of sodium in one serving. Most people should limit total sodium intake to  less than 2,300 mg per day. Always check the nutrition information of foods labeled as "low-fat" or "nonfat". These foods may be higher in added sugar or refined carbohydrates and should be avoided. Talk to your dietitian to identify your daily goals for nutrients listed on the label. Shopping Avoid buying canned, premade, or processed foods. These foods tend to be high in fat, sodium, and added sugar. Shop around the outside edge of the grocery store. This includes fresh fruits and vegetables, bulk grains, fresh meats, and fresh dairy. Cooking Use low-heat cooking methods, such as baking, instead of high-heat cooking methods like deep frying. Cook using healthy oils, such as olive, canola, or sunflower oil. Avoid cooking with butter, cream, or high-fat meats. Meal planning Eat meals and snacks regularly, preferably at the same times every day. Avoid going long periods of time without eating. Eat foods high in fiber, such as fresh fruits, vegetables, beans, and whole grains. Talk to your dietitian about how many servings of carbohydrates you can eat at each meal. Eat 4-6 ounces of lean protein each day, such as lean meat, chicken, fish, eggs, or tofu. 1 ounce is equal to 1 ounce of meat, chicken, or fish, 1 egg, or 1/4 cup of tofu. Eat some foods each day that contain healthy fats, such as avocado, nuts, seeds, and fish. Lifestyle  Check your blood glucose regularly. Exercise at least 30 minutes 5 or more days each week, or as told by your health care provider. Take medicines as told by your health care provider. Do not use any products that contain nicotine or tobacco, such as cigarettes and e-cigarettes. If you need help quitting, ask your health care provider. Work with a Veterinary surgeon or diabetes educator to identify strategies to manage stress and any emotional and social challenges. What are some questions to ask my health care provider? Do I need to meet with a diabetes educator? Do I need  to meet with a dietitian? What number can I call if I have questions? When are the best times to check my blood glucose? Where to find more information: American Diabetes Association: diabetes.org/food-and-fitness/food Academy of Nutrition and Dietetics: https://www.vargas.com/ [employer] of Diabetes and Digestive and Kidney Diseases (NIH): FindJewelers.cz Summary A healthy meal plan will help you control your blood glucose and maintain a healthy lifestyle. Working with a diet and nutrition specialist (dietitian) can help you make a meal plan that is best for you. Keep in mind that carbohydrates and alcohol have immediate effects on your blood glucose levels. It is important to count carbohydrates and to use alcohol carefully. This information is not intended to replace advice given to you by your health care provider. Make sure you discuss any questions you have with your health care provider. Document Released: 05/02/2005 Document Revised: 09/09/2016 Document Reviewed: 09/09/2016 Elsevier Interactive Patient Education  [employer].

## 2021-05-31 ENCOUNTER — Other Ambulatory Visit: Payer: Self-pay | Admitting: Family Medicine

## 2021-05-31 ENCOUNTER — Encounter: Payer: Self-pay | Admitting: Family Medicine

## 2021-05-31 LAB — RENAL FUNCTION PANEL
Albumin: 3.9 g/dL (ref 3.8–4.8)
BUN/Creatinine Ratio: 13 (ref 10–24)
BUN: 19 mg/dL (ref 8–27)
CO2: 26 mmol/L (ref 20–29)
Calcium: 9.5 mg/dL (ref 8.6–10.2)
Chloride: 103 mmol/L (ref 96–106)
Creatinine, Ser: 1.48 mg/dL — ABNORMAL HIGH (ref 0.76–1.27)
Glucose: 76 mg/dL (ref 70–99)
Phosphorus: 3.2 mg/dL (ref 2.8–4.1)
Potassium: 4 mmol/L (ref 3.5–5.2)
Sodium: 143 mmol/L (ref 134–144)
eGFR: 53 mL/min/{1.73_m2} — ABNORMAL LOW (ref >59)

## 2021-05-31 MED ORDER — CONTOUR NEXT TEST VI STRP: ORAL_STRIP | 11 refills | Status: DC

## 2021-06-01 MED ORDER — VITAMIN D3 125 MCG (5000 UT) PO CAPS: 1.0000 | ORAL_CAPSULE | Freq: Every day | ORAL | 3 refills | Status: DC

## 2021-06-02 ENCOUNTER — Other Ambulatory Visit: Payer: Self-pay | Admitting: Family Medicine

## 2021-06-04 DIAGNOSIS — Z6841 Body Mass Index (BMI) 40.0 and over, adult: Secondary | ICD-10-CM | POA: Diagnosis not present

## 2021-06-04 DIAGNOSIS — M1712 Unilateral primary osteoarthritis, left knee: Secondary | ICD-10-CM | POA: Diagnosis not present

## 2021-06-27 ENCOUNTER — Other Ambulatory Visit: Payer: Self-pay | Admitting: Family Medicine

## 2021-07-20 ENCOUNTER — Other Ambulatory Visit: Payer: Self-pay | Admitting: Family Medicine

## 2021-07-25 ENCOUNTER — Other Ambulatory Visit: Payer: Self-pay | Admitting: Family Medicine

## 2021-08-07 ENCOUNTER — Other Ambulatory Visit: Payer: Self-pay | Admitting: Family Medicine

## 2021-08-07 DIAGNOSIS — M1039 Gout due to renal impairment, multiple sites: Secondary | ICD-10-CM

## 2021-08-30 ENCOUNTER — Other Ambulatory Visit: Payer: Self-pay | Admitting: Family Medicine

## 2021-09-03 ENCOUNTER — Other Ambulatory Visit: Payer: Self-pay

## 2021-09-03 ENCOUNTER — Ambulatory Visit: Payer: BC Managed Care – PPO | Admitting: Family Medicine

## 2021-09-03 ENCOUNTER — Encounter: Payer: Self-pay | Admitting: Family Medicine

## 2021-09-03 VITALS — BP 136/75 | HR 76 | Temp 98.3°F | Ht 72.0 in | Wt 389.8 lb

## 2021-09-03 DIAGNOSIS — E1169 Type 2 diabetes mellitus with other specified complication: Secondary | ICD-10-CM

## 2021-09-03 DIAGNOSIS — Z91119 Patient's noncompliance with dietary regimen due to unspecified reason: Secondary | ICD-10-CM

## 2021-09-03 DIAGNOSIS — I152 Hypertension secondary to endocrine disorders: Secondary | ICD-10-CM

## 2021-09-03 DIAGNOSIS — E785 Hyperlipidemia, unspecified: Secondary | ICD-10-CM

## 2021-09-03 DIAGNOSIS — E1165 Type 2 diabetes mellitus with hyperglycemia: Secondary | ICD-10-CM | POA: Diagnosis not present

## 2021-09-03 DIAGNOSIS — Z794 Long term (current) use of insulin: Secondary | ICD-10-CM | POA: Diagnosis not present

## 2021-09-03 DIAGNOSIS — E1159 Type 2 diabetes mellitus with other circulatory complications: Secondary | ICD-10-CM | POA: Diagnosis not present

## 2021-09-03 DIAGNOSIS — E1122 Type 2 diabetes mellitus with diabetic chronic kidney disease: Secondary | ICD-10-CM | POA: Diagnosis not present

## 2021-09-03 LAB — BAYER DCA HB A1C WAIVED: HB A1C (BAYER DCA - WAIVED): 8.9 % — ABNORMAL HIGH (ref 4.8–5.6)

## 2021-09-03 MED ORDER — SOLIQUA 100-33 UNT-MCG/ML ~~LOC~~ SOPN: 62.0000 [IU] | PEN_INJECTOR | Freq: Every day | SUBCUTANEOUS | 11 refills | Status: DC

## 2021-09-03 NOTE — Progress Notes (Signed)
Subjective: CC:DM PCP: [name], DO PTW:[id number] Deans is a 61 y.o. male presenting to clinic today for:  1. Type 2 Diabetes with hypertension, hyperlipidemia:  Patient reports compliance with 60 units of Soliqua, metformin 500 mg twice daily and Crestor 5 mg daily.  He is compliant with his Coreg, Azor and Catapres.  He admits that he has not been following a strict diet due to the "holidays and his anniversary".  He also has had a couple of corticosteroid injections in his left knee since her last visit and is due for another tomorrow.  Blood sugars at home have been running typically in the 130s but he did have a blood sugar of 69 this morning that he had to eat something to bring back up.  In the evening time they can run around 150s to 160s.  He continues see Dr. Irving Shows for foot exams  Last eye exam: Needs Last foot exam: Up-to-date with Dr. Irving Shows Last A1c:  Lab Results  Component Value Date   HGBA1C 8.9 (H) 09/03/2021   Nephropathy screen indicated?:  Has known CKD 3A Last flu, zoster and/or pneumovax:  Immunization History  Administered Date(s) Administered   Influenza,inj,Quad PF,6+ Mos 07/19/2014, 06/14/2016, 07/15/2017, 07/29/2018, 05/26/2019, 05/30/2021   Influenza-Unspecified 07/19/2013   Pneumococcal Conjugate-13 04/19/2014   Pneumococcal Polysaccharide-23 04/29/2018   Td 05/13/2011   Tdap 05/13/2011    ROS: No chest pain, shortness of breath, blurred vision reported.  No foot ulcers  ROS: Per HPI  No Known Allergies Past Medical History:  Diagnosis Date   Diabetes mellitus    Frozen shoulder syndrome 2013   Right shoulder.  Has had PT.   Hypercholesterolemia    Hypertension     Current Outpatient Medications:    allopurinol (ZYLOPRIM) 100 MG tablet, Take 1/2 (one-half) tablet by mouth once daily, Disp: 45 tablet, Rfl: 0   amLODipine-olmesartan (AZOR) 10-40 MG tablet, Take 1 tablet by mouth daily., Disp: 90 tablet, Rfl: 0   blood glucose meter kit  and supplies, Dispense based on patient and insurance preference. Use up to two times daily as directed. (FOR ICD-10 E11.65), Disp: 1 each, Rfl: 5   carvedilol (COREG) 25 MG tablet, TAKE 1 TABLET BY MOUTH TWICE DAILY WITH A MEAL, Disp: 180 tablet, Rfl: 0   Cholecalciferol (VITAMIN D3) 125 MCG (5000 UT) CAPS, Take 1 capsule (5,000 Units total) by mouth daily., Disp: 90 capsule, Rfl: 3   cloNIDine (CATAPRES) 0.1 MG tablet, Take 1 tablet by mouth twice daily, Disp: 180 tablet, Rfl: 0   colchicine 0.6 MG tablet, Take 1.2 mg (two 0.6-mg tablets) orally at the first sign of a flare followed by 0.6 mg (1 tablet) one hour later; MAX 1.8 mg over 1 hour., Disp: 6 tablet, Rfl: 0   diclofenac sodium (VOLTAREN) 1 % GEL, Apply 4 g topically 4 (four) times daily., Disp: , Rfl:    furosemide (LASIX) 80 MG tablet, Take 1 tablet by mouth twice daily, Disp: 180 tablet, Rfl: 0   glucose blood (CONTOUR NEXT TEST) test strip, USE TO CHECK SUGAR UP TO TWICE DAILY. PATIENT NEEDS APPOINTMENT, Disp: 100 strip, Rfl: 11   Insulin Glargine-Lixisenatide (SOLIQUA) 100-33 UNT-MCG/ML SOPN, Inject 60 Units into the skin daily., Disp: 3 mL, Rfl: 11   Insulin Pen Needle 29G X 12.7MM MISC, Use to inject Lantus once qd, Disp: 100 each, Rfl: 3   metFORMIN (GLUCOPHAGE) 1000 MG tablet, TAKE 1/2 (ONE-HALF) TABLET BY MOUTH TWICE DAILY WITH A MEAL, Disp: 90  tablet, Rfl: 0   mometasone (NASONEX) 50 MCG/ACT nasal spray, Use 2 spray(s) in each nostril once daily, Disp: 17 g, Rfl: 3   rosuvastatin (CRESTOR) 5 MG tablet, Take 1 tablet by mouth once daily, Disp: 90 tablet, Rfl: 0 Social History   Socioeconomic History   Marital status: Married    Spouse name: Not on file   Number of children: 1   Years of education: Not on file   Highest education level: Not on file  Occupational History   Occupation: [employer]    Comment: Engineer, building services   Tobacco Use   Smoking status: Never   Smokeless tobacco: Never  Vaping Use   Vaping  Use: Never used  Substance and Sexual Activity   Alcohol use: No   Drug use: No   Sexual activity: Not on file  Other Topics Concern   Not on file  Social History Narrative   Not on file   Social Determinants of Health   Financial Resource Strain: Not on file  Food Insecurity: Not on file  Transportation Needs: Not on file  Physical Activity: Not on file  Stress: Not on file  Social Connections: Not on file  Intimate Partner Violence: Not on file   Family History  Problem Relation Age of Onset   Diabetes Mother    Heart failure Mother    Hypertension Father    Kidney disease Father    Cancer Sister    Hodgkin's lymphoma Sister    Hypertension Brother    Hypertension Brother    Hypertension Brother    Colon cancer Neg Hx     Objective: Office vital signs reviewed. BP 136/75    Pulse 76    Temp 98.3 F (36.8 C)    Ht 6' (1.829 m)    Wt (!) 389 lb 12.8 oz (176.8 kg)    SpO2 94%    BMI 52.87 kg/m   Physical Examination:  General: Awake, alert, morbidly obese, No acute distress HEENT: sclera white Cardio: regular rate and rhythm, S1S2 heard, no murmurs appreciated Pulm: clear to auscultation bilaterally, no wheezes, rhonchi or rales; normal work of breathing on room air MSK: Ambulating independently  Assessment/ Plan: 61 y.o. male   Uncontrolled type 2 diabetes mellitus with hyperglycemia ([place]) - Plan: Bayer DCA Hb A1c Waived  Hyperlipidemia associated with type 2 diabetes mellitus ([place])  Hypertension associated with diabetes ([place])  Morbid obesity ([place])  Noncompliance with dietary restriction  Sugars grossly uncontrolled with A1c rising quite a bit since her last visit at 8.9 today.  I think that his blood sugar of 69 is an outlier today and he relatively runs uncontrolled.  For this reason I advised to increase to 62 units daily of the [place].  He can increase to 64 units if he notes a very large increase in blood sugar after his next corticosteroid injection.   I reinforced absolute need for compliance with diet as he poses a higher cardiovascular risk with having uncontrolled levels.  He will continue statin  Blood pressure upon recheck was controlled.  No changes  No orders of the defined types were placed in this encounter.  No orders of the defined types were placed in this encounter.    [name], DO [place] [phone]

## 2021-09-03 NOTE — Patient Instructions (Signed)
Increase the Soliqua to 62 units.  May go to 64 units if sugar high after your joint injections.  Carbohydrate Counting for Diabetes Mellitus, Adult Carbohydrate counting is a method of keeping track of how many carbohydrates you eat. Eating carbohydrates increases the amount of sugar (glucose) in the blood. Counting how many carbohydrates you eat improves how well you manage your blood glucose. This, in turn, helps you manage your diabetes. Carbohydrates are measured in grams (g) per serving. It is important to know how many carbohydrates (in grams or by serving size) you can have in each meal. This is different for every person. A dietitian can help you make a meal plan and calculate how many carbohydrates you should have at each meal and snack. What foods contain carbohydrates? Carbohydrates are found in the following foods: Grains, such as breads and cereals. Dried beans and soy products. Starchy vegetables, such as potatoes, peas, and corn. Fruit and fruit juices. Milk and yogurt. Sweets and snack foods, such as cake, cookies, candy, chips, and soft drinks. How do I count carbohydrates in foods? There are two ways to count carbohydrates in food. You can read food labels or learn standard serving sizes of foods. You can use either of these methods or a combination of both. Using the Nutrition Facts label The Nutrition Facts list is included on the labels of almost all packaged foods and beverages in the [place]. It includes: The serving size. Information about nutrients in each serving, including the grams of carbohydrate per serving. To use the Nutrition Facts, decide how many servings you will have. Then, multiply the number of servings by the number of carbohydrates per serving. The resulting number is the total grams of carbohydrates that you will be having. Learning the standard serving sizes of foods When you eat carbohydrate foods that are not packaged or do not include  Nutrition Facts on the label, you need to measure the servings in order to count the grams of carbohydrates. Measure the foods that you will eat with a food scale or measuring cup, if needed. Decide how many standard-size servings you will eat. Multiply the number of servings by 15. For foods that contain carbohydrates, one serving equals 15 g of carbohydrates. For example, if you eat 2 cups or 10 oz (300 g) of strawberries, you will have eaten 2 servings and 30 g of carbohydrates (2 servings x 15 g = 30 g). For foods that have more than one food mixed, such as soups and casseroles, you must count the carbohydrates in each food that is included. The following list contains standard serving sizes of common carbohydrate-rich foods. Each of these servings has about 15 g of carbohydrates: 1 slice of bread. 1 six-inch (15 cm) tortilla. ? cup or 2 oz (53 g) cooked rice or pasta.  cup or 3 oz (85 g) cooked or canned, drained and rinsed beans or lentils.  cup or 3 oz (85 g) starchy vegetable, such as peas, corn, or squash.  cup or 4 oz (120 g) hot cereal.  cup or 3 oz (85 g) boiled or mashed potatoes, or  or 3 oz (85 g) of a large baked potato.  cup or 4 fl oz (118 mL) fruit juice. 1 cup or 8 fl oz (237 mL) milk. 1 small or 4 oz (106 g) apple.  or 2 oz (63 g) of a medium banana. 1 cup or 5 oz (150 g) strawberries. 3 cups or 1 oz (28.3 g) popped popcorn.  What is an example of carbohydrate counting? To calculate the grams of carbohydrates in this sample meal, follow the steps shown below. Sample meal 3 oz (85 g) chicken breast. ? cup or 4 oz (106 g) brown rice.  cup or 3 oz (85 g) corn. 1 cup or 8 fl oz (237 mL) milk. 1 cup or 5 oz (150 g) strawberries with sugar-free whipped topping. Carbohydrate calculation Identify the foods that contain carbohydrates: Rice. Corn. Milk. Strawberries. Calculate how many servings you have of each food: 2 servings rice. 1 serving corn. 1 serving  milk. 1 serving strawberries. Multiply each number of servings by 15 g: 2 servings rice x 15 g = 30 g. 1 serving corn x 15 g = 15 g. 1 serving milk x 15 g = 15 g. 1 serving strawberries x 15 g = 15 g. Add together all of the amounts to find the total grams of carbohydrates eaten: 30 g + 15 g + 15 g + 15 g = 75 g of carbohydrates total. What are tips for following this plan? Shopping Develop a meal plan and then make a shopping list. Buy fresh and frozen vegetables, fresh and frozen fruit, dairy, eggs, beans, lentils, and whole grains. Look at food labels. Choose foods that have more fiber and less sugar. Avoid processed foods and foods with added sugars. Meal planning Aim to have the same number of grams of carbohydrates at each meal and for each snack time. Plan to have regular, balanced meals and snacks. Where to find more information American Diabetes Association: diabetes.org Centers for Disease Control and Prevention: TonerPromos.no Academy of Nutrition and Dietetics: eatright.org Association of Diabetes Care & Education Specialists: diabeteseducator.org Summary Carbohydrate counting is a method of keeping track of how many carbohydrates you eat. Eating carbohydrates increases the amount of sugar (glucose) in your blood. Counting how many carbohydrates you eat improves how well you manage your blood glucose. This helps you manage your diabetes. A dietitian can help you make a meal plan and calculate how many carbohydrates you should have at each meal and snack. This information is not intended to replace advice given to you by your health care provider. Make sure you discuss any questions you have with your health care provider. Document Revised: 03/08/2020 Document Reviewed: 03/08/2020 Elsevier Patient Education  2022 [employer].

## 2021-09-04 DIAGNOSIS — M65342 Trigger finger, left ring finger: Secondary | ICD-10-CM | POA: Diagnosis not present

## 2021-09-04 DIAGNOSIS — M1712 Unilateral primary osteoarthritis, left knee: Secondary | ICD-10-CM | POA: Diagnosis not present

## 2021-09-04 DIAGNOSIS — Z6841 Body Mass Index (BMI) 40.0 and over, adult: Secondary | ICD-10-CM | POA: Diagnosis not present

## 2021-09-30 ENCOUNTER — Other Ambulatory Visit: Payer: Self-pay | Admitting: Family Medicine

## 2021-10-17 ENCOUNTER — Other Ambulatory Visit: Payer: Self-pay | Admitting: Family Medicine

## 2021-10-22 ENCOUNTER — Other Ambulatory Visit: Payer: Self-pay | Admitting: Family Medicine

## 2021-10-22 DIAGNOSIS — Z794 Long term (current) use of insulin: Secondary | ICD-10-CM

## 2021-11-01 DIAGNOSIS — B351 Tinea unguium: Secondary | ICD-10-CM | POA: Diagnosis not present

## 2021-11-01 DIAGNOSIS — M79676 Pain in unspecified toe(s): Secondary | ICD-10-CM | POA: Diagnosis not present

## 2021-11-11 ENCOUNTER — Other Ambulatory Visit: Payer: Self-pay | Admitting: Family Medicine

## 2021-11-27 ENCOUNTER — Telehealth: Payer: Self-pay | Admitting: Family Medicine

## 2021-11-27 NOTE — Telephone Encounter (Signed)
Absolutely fine with me

## 2021-11-27 NOTE — Telephone Encounter (Signed)
Ok with me 

## 2021-11-27 NOTE — Telephone Encounter (Signed)
Lmtcb Fine to change PCP to tiff morgan please change and schedule appt with her. For next check  ?

## 2021-12-02 ENCOUNTER — Other Ambulatory Visit: Payer: Self-pay | Admitting: Family Medicine

## 2021-12-04 ENCOUNTER — Ambulatory Visit: Payer: BC Managed Care – PPO | Admitting: Family Medicine

## 2021-12-06 DIAGNOSIS — M1712 Unilateral primary osteoarthritis, left knee: Secondary | ICD-10-CM | POA: Diagnosis not present

## 2021-12-06 DIAGNOSIS — Z6841 Body Mass Index (BMI) 40.0 and over, adult: Secondary | ICD-10-CM | POA: Diagnosis not present

## 2021-12-06 DIAGNOSIS — M65342 Trigger finger, left ring finger: Secondary | ICD-10-CM | POA: Diagnosis not present

## 2021-12-28 ENCOUNTER — Other Ambulatory Visit: Payer: Self-pay | Admitting: Family Medicine

## 2022-01-02 ENCOUNTER — Other Ambulatory Visit: Payer: Self-pay | Admitting: Family Medicine

## 2022-01-09 ENCOUNTER — Encounter: Payer: Self-pay | Admitting: Family Medicine

## 2022-01-09 ENCOUNTER — Ambulatory Visit: Payer: BC Managed Care – PPO | Admitting: Family Medicine

## 2022-01-09 VITALS — BP 139/78 | HR 69 | Temp 97.6°F | Ht 72.0 in | Wt 381.1 lb

## 2022-01-09 DIAGNOSIS — Z7689 Persons encountering health services in other specified circumstances: Secondary | ICD-10-CM

## 2022-01-09 DIAGNOSIS — E1159 Type 2 diabetes mellitus with other circulatory complications: Secondary | ICD-10-CM

## 2022-01-09 DIAGNOSIS — E1169 Type 2 diabetes mellitus with other specified complication: Secondary | ICD-10-CM

## 2022-01-09 DIAGNOSIS — E1165 Type 2 diabetes mellitus with hyperglycemia: Secondary | ICD-10-CM

## 2022-01-09 DIAGNOSIS — E785 Hyperlipidemia, unspecified: Secondary | ICD-10-CM

## 2022-01-09 DIAGNOSIS — N1831 Chronic kidney disease, stage 3a: Secondary | ICD-10-CM | POA: Insufficient documentation

## 2022-01-09 DIAGNOSIS — I152 Hypertension secondary to endocrine disorders: Secondary | ICD-10-CM | POA: Diagnosis not present

## 2022-01-09 LAB — BAYER DCA HB A1C WAIVED: HB A1C (BAYER DCA - WAIVED): 12 % — ABNORMAL HIGH (ref 4.8–5.6)

## 2022-01-09 MED ORDER — SOLIQUA 100-33 UNT-MCG/ML ~~LOC~~ SOPN: 62.0000 [IU] | PEN_INJECTOR | Freq: Every day | SUBCUTANEOUS | 3 refills | Status: DC

## 2022-01-09 MED ORDER — DAPAGLIFLOZIN PROPANEDIOL 10 MG PO TABS: 10.0000 mg | ORAL_TABLET | Freq: Every day | ORAL | 1 refills | Status: DC

## 2022-01-09 NOTE — Progress Notes (Signed)
Established Patient Office Visit  Subjective   Patient ID: Jon Moore, male    DOB: 1960-09-07  Age: 62 y.o. MRN: [id number]  Chief Complaint  Patient presents with   Medical Management of Chronic Issues   Diabetes   Hyperlipidemia   Hypertension    HPI Jon Moore is here to establish care and for a chronic follow up. He has been a patient of Dr. Lajuana Moore. He reports that his blood sugars have not been controlled. His fasting blood sugars have been around 200. He has been taking soliqua 64 units daily. He has been taking this as prescribed. He reports that he is trying to improve his diet as he has a history of noncompliance with a diabetic diet. He has not been exercising due to joint pain. He is trying to increase this since having his knee injection. He sees Dr. Irving Moore every 3 months. He recently had a steroid injections, one in his knee and one in his finger. He is not UTD on his eye exam. Patient denies foot ulcerations, increased appetite, nausea, paresthesia of the feet, polydipsia, polyuria, visual disturbances, vomiting, and weight loss. Denies chest pain, shortness of breath, edema, orthopnea, dizziness, palpitations, or focal weakness.   Patient Active Problem List   Diagnosis Date Noted   Stage 3a chronic kidney disease ([place]) 01/09/2022   Vitamin D deficiency 09/14/2019   Essential hypertension, benign 04/05/2019   Mixed hyperlipidemia 04/05/2019   Gout of multiple sites due to renal impairment 10/28/2018   Personal history of diabetic foot ulcer 04/29/2018   OA (osteoarthritis) of knee 04/08/2017   Healthcare maintenance 09/11/2015   Elbow pain 07/08/2015   Hypertension associated with diabetes ([place]) 11/20/2011   Uncontrolled type 2 diabetes mellitus with stage 3 chronic kidney disease 11/20/2011   Morbid obesity ([place]) 11/20/2011   Hyperlipidemia associated with type 2 diabetes mellitus ([place]) 11/20/2011   Venous stasis of lower extremity 11/20/2011   Screening for colon  cancer 05/30/2011      ROS As per HPI.    Objective:     BP 139/78   Pulse 69   Temp 97.6 F (36.4 C) (Temporal)   Ht 6' (1.829 m)   Wt (!) 381 lb 2 oz (172.9 kg)   SpO2 96%   BMI 51.69 kg/m  BP Readings from Last 3 Encounters:  01/09/22 139/78  09/03/21 136/75  05/30/21 136/75      Physical Exam Vitals and nursing note reviewed.  Constitutional:      General: He is not in acute distress.    Appearance: He is obese. He is not ill-appearing, toxic-appearing or diaphoretic.  HENT:     Head: Normocephalic and atraumatic.     Right Ear: Tympanic membrane, ear canal and external ear normal.     Left Ear: Tympanic membrane, ear canal and external ear normal.     Nose: Nose normal.     Mouth/Throat:     Mouth: Mucous membranes are moist.     Pharynx: Oropharynx is clear.  Eyes:     General: No scleral icterus.       Right eye: No discharge.        Left eye: No discharge.     Pupils: Pupils are equal, round, and reactive to light.  Neck:     Thyroid: No thyroid mass, thyromegaly or thyroid tenderness.     Vascular: No carotid bruit or JVD.  Cardiovascular:     Rate and Rhythm: Normal rate and regular rhythm.  Heart sounds: Normal heart sounds. No murmur heard. Pulmonary:     Effort: Pulmonary effort is normal. No respiratory distress.     Breath sounds: Normal breath sounds.  Abdominal:     General: Bowel sounds are normal. There is no distension.     Palpations: Abdomen is soft.     Tenderness: There is no abdominal tenderness. There is no guarding or rebound.  Musculoskeletal:     Right lower leg: No edema.     Left lower leg: No edema.  Skin:    General: Skin is warm and dry.  Neurological:     General: No focal deficit present.     Mental Status: He is alert and oriented to person, place, and time.  Psychiatric:        Mood and Affect: Mood normal.        Behavior: Behavior normal.        Thought Content: Thought content normal.     No results  found for any visits on 01/09/22.  Last CBC Lab Results  Component Value Date   WBC 6.5 05/26/2019   HGB 14.3 05/26/2019   HCT 42.6 05/26/2019   MCV 80 05/26/2019   MCH 26.9 05/26/2019   RDW 14.4 05/26/2019   PLT 255 77/82/4235   Last metabolic panel Lab Results  Component Value Date   GLUCOSE 76 05/30/2021   NA 143 05/30/2021   K 4.0 05/30/2021   CL 103 05/30/2021   CO2 26 05/30/2021   BUN 19 05/30/2021   CREATININE 1.48 (H) 05/30/2021   EGFR 53 (L) 05/30/2021   CALCIUM 9.5 05/30/2021   PHOS 3.2 05/30/2021   PROT 7.6 02/27/2021   ALBUMIN 3.9 05/30/2021   LABGLOB 3.6 02/27/2021   AGRATIO 1.1 (L) 02/27/2021   BILITOT 0.4 02/27/2021   ALKPHOS 129 (H) 02/27/2021   AST 20 02/27/2021   ALT 30 02/27/2021   Last lipids Lab Results  Component Value Date   CHOL 141 02/27/2021   HDL 29 (L) 02/27/2021   LDLCALC 79 02/27/2021   TRIG 192 (H) 02/27/2021   CHOLHDL 4.9 02/27/2021   Last hemoglobin A1c Lab Results  Component Value Date   HGBA1C 8.9 (H) 09/03/2021      The 10-year ASCVD risk score (Arnett DK, et al., 2019) is: 30%    Assessment & Plan:   Jon Moore was seen today for medical management of chronic issues, diabetes, hyperlipidemia and hypertension.  Diagnoses and all orders for this visit:  Uncontrolled type 2 diabetes mellitus with hyperglycemia ([place]) Very uncontrolled today with A1c of 12. Add farxiga. Continue soliqua. Discussed diet, exercise, and avoidance of steroid injections. Keep appt with podiatry. Eye exam scheduled with Jon Moore today. On statin and ARB. Follow up with Jon Moore in 4 weeks to DM.  -     CBC with Differential/Platelet -     CMP14+EGFR -     Lipid panel -     Bayer DCA Hb A1c Waived -     dapagliflozin propanediol (FARXIGA) 10 MG TABS tablet; Take 1 tablet (10 mg total) by mouth daily before breakfast. -     Insulin Glargine-Lixisenatide (SOLIQUA) 100-33 UNT-MCG/ML SOPN; Inject 62-64 Units into the skin daily.  Hyperlipidemia associated  with type 2 diabetes mellitus ([place]) On statin. Labs pending.  -     CBC with Differential/Platelet -     CMP14+EGFR -     Lipid panel  Hypertension associated with diabetes ([place]) On amlodipine-olmesartan. Well controlled on current regimen. Labs  pending.  -     CBC with Differential/Platelet -     CMP14+EGFR -     Lipid panel  Stage 3a chronic kidney disease ([place]) Start farxiga. Limit NSAIDs. Labs pending.  -     CBC with Differential/Platelet -     CMP14+EGFR  Morbid obesity ([facility]) Diet, exercise. Labs pending.  -     CBC with Differential/Platelet -     CMP14+EGFR -     Lipid panel  Encounter to establish care  Return in about 4 weeks (around 02/06/2022) for with Jon Moore for DM, 3 months with me for chronic follow up.   The patient indicates understanding of these issues and agrees with the plan.  Gwenlyn Perking, FNP

## 2022-01-09 NOTE — Patient Instructions (Signed)

## 2022-01-10 LAB — CBC WITH DIFFERENTIAL/PLATELET
Basophils Absolute: 0.1 10*3/uL (ref 0.0–0.2)
Basos: 1 %
EOS (ABSOLUTE): 0.3 10*3/uL (ref 0.0–0.4)
Eos: 4 %
Hematocrit: 43 % (ref 37.5–51.0)
Hemoglobin: 14.7 g/dL (ref 13.0–17.7)
Immature Grans (Abs): 0 10*3/uL (ref 0.0–0.1)
Immature Granulocytes: 0 %
Lymphocytes Absolute: 2.8 10*3/uL (ref 0.7–3.1)
Lymphs: 40 %
MCH: 27.3 pg (ref 26.6–33.0)
MCHC: 34.2 g/dL (ref 31.5–35.7)
MCV: 80 fL (ref 79–97)
Monocytes Absolute: 0.6 10*3/uL (ref 0.1–0.9)
Monocytes: 9 %
Neutrophils Absolute: 3.3 10*3/uL (ref 1.4–7.0)
Neutrophils: 46 %
Platelets: 261 10*3/uL (ref 150–450)
RBC: 5.39 x10E6/uL (ref 4.14–5.80)
RDW: 14.1 % (ref 11.6–15.4)
WBC: 7.1 10*3/uL (ref 3.4–10.8)

## 2022-01-10 LAB — LIPID PANEL
Chol/HDL Ratio: 5.3 ratio — ABNORMAL HIGH (ref 0.0–5.0)
Cholesterol, Total: 126 mg/dL (ref 100–199)
HDL: 24 mg/dL — ABNORMAL LOW (ref >39)
LDL Chol Calc (NIH): 67 mg/dL (ref 0–99)
Triglycerides: 209 mg/dL — ABNORMAL HIGH (ref 0–149)
VLDL Cholesterol Cal: 35 mg/dL (ref 5–40)

## 2022-01-10 LAB — CMP14+EGFR
ALT: 33 IU/L (ref 0–44)
AST: 24 IU/L (ref 0–40)
Albumin/Globulin Ratio: 1.1 — ABNORMAL LOW (ref 1.2–2.2)
Albumin: 4.1 g/dL (ref 3.8–4.8)
Alkaline Phosphatase: 144 IU/L — ABNORMAL HIGH (ref 44–121)
BUN/Creatinine Ratio: 14 (ref 10–24)
BUN: 21 mg/dL (ref 8–27)
Bilirubin Total: 0.5 mg/dL (ref 0.0–1.2)
CO2: 26 mmol/L (ref 20–29)
Calcium: 9.5 mg/dL (ref 8.6–10.2)
Chloride: 98 mmol/L (ref 96–106)
Creatinine, Ser: 1.51 mg/dL — ABNORMAL HIGH (ref 0.76–1.27)
Globulin, Total: 3.7 g/dL (ref 1.5–4.5)
Glucose: 160 mg/dL — ABNORMAL HIGH (ref 70–99)
Potassium: 4.3 mmol/L (ref 3.5–5.2)
Sodium: 139 mmol/L (ref 134–144)
Total Protein: 7.8 g/dL (ref 6.0–8.5)
eGFR: 52 mL/min/{1.73_m2} — ABNORMAL LOW (ref >59)

## 2022-01-19 ENCOUNTER — Other Ambulatory Visit: Payer: Self-pay | Admitting: Family Medicine

## 2022-01-24 DIAGNOSIS — M79676 Pain in unspecified toe(s): Secondary | ICD-10-CM | POA: Diagnosis not present

## 2022-01-24 DIAGNOSIS — B351 Tinea unguium: Secondary | ICD-10-CM | POA: Diagnosis not present

## 2022-01-31 DIAGNOSIS — Z6841 Body Mass Index (BMI) 40.0 and over, adult: Secondary | ICD-10-CM | POA: Diagnosis not present

## 2022-01-31 DIAGNOSIS — M1712 Unilateral primary osteoarthritis, left knee: Secondary | ICD-10-CM | POA: Diagnosis not present

## 2022-01-31 DIAGNOSIS — M65342 Trigger finger, left ring finger: Secondary | ICD-10-CM | POA: Diagnosis not present

## 2022-02-02 ENCOUNTER — Other Ambulatory Visit: Payer: Self-pay | Admitting: Family Medicine

## 2022-02-04 ENCOUNTER — Other Ambulatory Visit: Payer: Self-pay | Admitting: Family Medicine

## 2022-02-07 ENCOUNTER — Ambulatory Visit: Payer: BC Managed Care – PPO | Admitting: Pharmacist

## 2022-02-07 VITALS — BP 132/81

## 2022-02-07 DIAGNOSIS — E785 Hyperlipidemia, unspecified: Secondary | ICD-10-CM | POA: Diagnosis not present

## 2022-02-07 DIAGNOSIS — E1169 Type 2 diabetes mellitus with other specified complication: Secondary | ICD-10-CM | POA: Diagnosis not present

## 2022-02-15 ENCOUNTER — Encounter: Payer: Self-pay | Admitting: Pharmacist

## 2022-02-15 MED ORDER — LANTUS SOLOSTAR 100 UNIT/ML ~~LOC~~ SOPN: 10.0000 [IU] | PEN_INJECTOR | Freq: Every day | SUBCUTANEOUS | 99 refills | Status: DC

## 2022-02-15 NOTE — Progress Notes (Signed)
    02/15/2022 Name: Jon Moore MRN: [id number] DOB: 10/30/1960   S: 60yoM Presents for diabetes evaluation, education, and management Patient was referred and last seen by Primary Care Provider on 01/09/22.  Patient's t2dm has been poorly controlled for years.  He has difficulty with lifestyle changes mostly due to chronic joint pain. Insurance coverage/medication affordability: BCBS  Patient reports adherence with medications. Current diabetes medications include: farxiga, soliqua, metformin Current hypertension medications include: coreg, clonidine, amlodipine, olmesartan Goal 130/80 Current hyperlipidemia medications include: rosuvastatin  Current exercise: none due to knee/joint pain   Patient denies hypoglycemic events.    O:  Lab Results  Component Value Date   HGBA1C 12.0 (H) 01/09/2022     Lipid Panel     Component Value Date/Time   CHOL 126 01/09/2022 0818   TRIG 209 (H) 01/09/2022 0818   HDL 24 (L) 01/09/2022 0818   CHOLHDL 5.3 (H) 01/09/2022 0818   LDLCALC 67 01/09/2022 0818     Home fasting blood sugars: 130-150  2 hour post-meal/random blood sugars: n/a.    Clinical Atherosclerotic Cardiovascular Disease (ASCVD): No   The ASCVD Risk score (Arnett DK, et al., 2019) failed to calculate for the following reasons:   The valid total cholesterol range is 130 to 320 mg/dL    A/P:  Diabetes Y4IH currently uncontrolled due to diet, lifestyle and recent steroid injections.  Currently on max dose soliqua (60 units dialy), farxiga, metformin.  Will add additional basal insulin.  May consider discontinuation of soliqua and going with stronger GLP1/GIP plus concentrated basal insulin.  Will continue to follow.  May also need meal time insulin in the future. Watch GFR 52 (on sglt2 for protection now)   -Continue Soliqua max dose of 60 units daily -Adding basal lantus 10 units nightly to try and achieve optimal control (additional 10 units nightly as  discussed)  Denies personal and family history of Medullary thyroid cancer ([facility])  -Continue all other medications  -Extensively discussed pathophysiology of diabetes, recommended lifestyle interventions, dietary effects on blood sugar control  -Counseled on s/sx of and management of hypoglycemia  -Next A1C anticipated 3 months.    Written patient instructions provided.  Total time in face to face counseling 25 minutes.   Follow up with PharmD in 1 month   Kieth Brightly, PharmD, BCPS Clinical Pharmacist, [facility]  II Phone [phone]

## 2022-02-28 LAB — HM DIABETES EYE EXAM

## 2022-03-03 ENCOUNTER — Other Ambulatory Visit: Payer: Self-pay | Admitting: Family Medicine

## 2022-03-07 ENCOUNTER — Telehealth: Payer: Self-pay

## 2022-03-07 NOTE — Chronic Care Management (AMB) (Signed)
  Care Management   Outreach Note  03/07/2022 Name: Jon Moore MRN: [id number] DOB: 04-27-1961  Referred by: Gabriel Earing, FNP Reason for referral : Care Coordination (Outreach to schedule with [facility] COMM RNCM from Commercial list )   An unsuccessful telephone outreach was attempted today. The patient was referred to the case management team for assistance with care management and care coordination.   Follow Up Plan:  A HIPAA compliant phone message was left for the patient providing contact information and requesting a return call.  The care management team will reach out to the patient again over the next 7 days.  If patient returns call to provider office, please advise to call  Care Management Care Guide Penne Friar * at [phone] Lona, RMA Care Guide [facility]  [place], [place] [address] Direct Dial: [phone] [name]

## 2022-03-19 ENCOUNTER — Ambulatory Visit: Payer: BC Managed Care – PPO | Admitting: Pharmacist

## 2022-03-24 ENCOUNTER — Other Ambulatory Visit: Payer: Self-pay | Admitting: Family Medicine

## 2022-04-01 DIAGNOSIS — Z6841 Body Mass Index (BMI) 40.0 and over, adult: Secondary | ICD-10-CM | POA: Diagnosis not present

## 2022-04-01 DIAGNOSIS — M25642 Stiffness of left hand, not elsewhere classified: Secondary | ICD-10-CM | POA: Diagnosis not present

## 2022-04-01 DIAGNOSIS — M65342 Trigger finger, left ring finger: Secondary | ICD-10-CM | POA: Diagnosis not present

## 2022-04-01 DIAGNOSIS — M1712 Unilateral primary osteoarthritis, left knee: Secondary | ICD-10-CM | POA: Diagnosis not present

## 2022-04-02 ENCOUNTER — Ambulatory Visit (INDEPENDENT_AMBULATORY_CARE_PROVIDER_SITE_OTHER): Payer: BC Managed Care – PPO | Admitting: Pharmacist

## 2022-04-02 DIAGNOSIS — N1831 Chronic kidney disease, stage 3a: Secondary | ICD-10-CM | POA: Diagnosis not present

## 2022-04-02 DIAGNOSIS — E1165 Type 2 diabetes mellitus with hyperglycemia: Secondary | ICD-10-CM | POA: Diagnosis not present

## 2022-04-04 ENCOUNTER — Other Ambulatory Visit: Payer: Self-pay | Admitting: Family Medicine

## 2022-04-04 DIAGNOSIS — M25642 Stiffness of left hand, not elsewhere classified: Secondary | ICD-10-CM | POA: Diagnosis not present

## 2022-04-04 DIAGNOSIS — M65342 Trigger finger, left ring finger: Secondary | ICD-10-CM | POA: Diagnosis not present

## 2022-04-08 DIAGNOSIS — M25642 Stiffness of left hand, not elsewhere classified: Secondary | ICD-10-CM | POA: Diagnosis not present

## 2022-04-08 DIAGNOSIS — M65342 Trigger finger, left ring finger: Secondary | ICD-10-CM | POA: Diagnosis not present

## 2022-04-08 NOTE — Chronic Care Management (AMB) (Signed)
  Care Coordination  Note  04/08/2022 Name: Jon Moore MRN: [id number] DOB: Oct 28, 1960  Jon Moore is a 61 y.o. year old male who is a primary care patient of Gabriel Earing, FNP. I reached out to Bobbie Stack by phone today to offer care coordination services.      [name] was given information about Care Coordination services today including:  The Care Coordination services include support from the care team which includes your Nurse Coordinator, Clinical Social Worker, or Pharmacist.  The Care Coordination team is here to help remove barriers to the health concerns and goals most important to you. Care Coordination services are voluntary and the patient may decline or stop services at any time by request to their care team member.   Patient did not agree to participate in care coordination services at this time.  Follow up plan: Patient declines further follow up or participation in care coordination services.   Penne Rane, RMA Care Guide [facility] [place], [place] [address] Direct Dial: [phone] [email or web address]

## 2022-04-11 DIAGNOSIS — M79676 Pain in unspecified toe(s): Secondary | ICD-10-CM | POA: Diagnosis not present

## 2022-04-11 DIAGNOSIS — B351 Tinea unguium: Secondary | ICD-10-CM | POA: Diagnosis not present

## 2022-04-12 ENCOUNTER — Encounter: Payer: Self-pay | Admitting: Family Medicine

## 2022-04-12 ENCOUNTER — Ambulatory Visit: Payer: BC Managed Care – PPO | Admitting: Family Medicine

## 2022-04-12 VITALS — BP 121/70 | HR 69 | Temp 97.1°F | Ht 72.0 in | Wt 378.4 lb

## 2022-04-12 DIAGNOSIS — I152 Hypertension secondary to endocrine disorders: Secondary | ICD-10-CM

## 2022-04-12 DIAGNOSIS — E1169 Type 2 diabetes mellitus with other specified complication: Secondary | ICD-10-CM | POA: Diagnosis not present

## 2022-04-12 DIAGNOSIS — N1831 Chronic kidney disease, stage 3a: Secondary | ICD-10-CM | POA: Diagnosis not present

## 2022-04-12 DIAGNOSIS — E1165 Type 2 diabetes mellitus with hyperglycemia: Secondary | ICD-10-CM | POA: Diagnosis not present

## 2022-04-12 DIAGNOSIS — E785 Hyperlipidemia, unspecified: Secondary | ICD-10-CM

## 2022-04-12 DIAGNOSIS — E1159 Type 2 diabetes mellitus with other circulatory complications: Secondary | ICD-10-CM

## 2022-04-12 LAB — CBC WITH DIFFERENTIAL/PLATELET
Basophils Absolute: 0.1 10*3/uL (ref 0.0–0.2)
Basos: 1 %
EOS (ABSOLUTE): 0.2 10*3/uL (ref 0.0–0.4)
Eos: 4 %
Hematocrit: 43.9 % (ref 37.5–51.0)
Hemoglobin: 14.9 g/dL (ref 13.0–17.7)
Immature Grans (Abs): 0.1 10*3/uL (ref 0.0–0.1)
Immature Granulocytes: 1 %
Lymphocytes Absolute: 2.6 10*3/uL (ref 0.7–3.1)
Lymphs: 38 %
MCH: 27.2 pg (ref 26.6–33.0)
MCHC: 33.9 g/dL (ref 31.5–35.7)
MCV: 80 fL (ref 79–97)
Monocytes Absolute: 0.8 10*3/uL (ref 0.1–0.9)
Monocytes: 12 %
Neutrophils Absolute: 3.1 10*3/uL (ref 1.4–7.0)
Neutrophils: 44 %
Platelets: 260 10*3/uL (ref 150–450)
RBC: 5.47 x10E6/uL (ref 4.14–5.80)
RDW: 13.8 % (ref 11.6–15.4)
WBC: 6.9 10*3/uL (ref 3.4–10.8)

## 2022-04-12 LAB — LIPID PANEL
Chol/HDL Ratio: 4.8 ratio (ref 0.0–5.0)
Cholesterol, Total: 135 mg/dL (ref 100–199)
HDL: 28 mg/dL — ABNORMAL LOW (ref >39)
LDL Chol Calc (NIH): 69 mg/dL (ref 0–99)
Triglycerides: 229 mg/dL — ABNORMAL HIGH (ref 0–149)
VLDL Cholesterol Cal: 38 mg/dL (ref 5–40)

## 2022-04-12 LAB — CMP14+EGFR
ALT: 22 IU/L (ref 0–44)
AST: 19 IU/L (ref 0–40)
Albumin/Globulin Ratio: 1.2 (ref 1.2–2.2)
Albumin: 4 g/dL (ref 3.9–4.9)
Alkaline Phosphatase: 144 IU/L — ABNORMAL HIGH (ref 44–121)
BUN/Creatinine Ratio: 15 (ref 10–24)
BUN: 25 mg/dL (ref 8–27)
Bilirubin Total: 0.4 mg/dL (ref 0.0–1.2)
CO2: 24 mmol/L (ref 20–29)
Calcium: 9.3 mg/dL (ref 8.6–10.2)
Chloride: 103 mmol/L (ref 96–106)
Creatinine, Ser: 1.71 mg/dL — ABNORMAL HIGH (ref 0.76–1.27)
Globulin, Total: 3.4 g/dL (ref 1.5–4.5)
Glucose: 107 mg/dL — ABNORMAL HIGH (ref 70–99)
Potassium: 4.3 mmol/L (ref 3.5–5.2)
Sodium: 142 mmol/L (ref 134–144)
Total Protein: 7.4 g/dL (ref 6.0–8.5)
eGFR: 45 mL/min/{1.73_m2} — ABNORMAL LOW (ref >59)

## 2022-04-12 LAB — BAYER DCA HB A1C WAIVED: HB A1C (BAYER DCA - WAIVED): 9.7 % — ABNORMAL HIGH (ref 4.8–5.6)

## 2022-04-12 NOTE — Patient Instructions (Signed)
Titrate lantus insulin up by 2 units every 2-3 days if fasting blood sugars are greater than 120. Stop at 20 units total. Follow up with Jon Moore in 4 weeks to discuss.

## 2022-04-12 NOTE — Progress Notes (Signed)
Established Patient Office Visit  Subjective   Patient ID: Jon Moore, male    DOB: 22-May-1961  Age: 61 y.o. MRN: [id number]  Chief Complaint  Patient presents with   Medical Management of Chronic Issues   Hyperlipidemia   Diabetes   Hypertension    Hyperlipidemia This is a chronic problem. Exacerbating diseases include chronic renal disease, diabetes and obesity. Pertinent negatives include no chest pain, focal sensory loss, focal weakness, leg pain, myalgias or shortness of breath. Current antihyperlipidemic treatment includes statins. Compliance problems include adherence to diet and adherence to exercise.  Risk factors for coronary artery disease include diabetes mellitus, dyslipidemia, male sex, obesity and hypertension.  Diabetes He presents for his follow-up diabetic visit. He has type 2 diabetes mellitus. His disease course has been improving. Pertinent negatives for hypoglycemia include no sweats. Pertinent negatives for diabetes include no blurred vision, no chest pain, no fatigue, no foot paresthesias, no foot ulcerations, no polydipsia, no polyphagia, no polyuria, no visual change, no weakness and no weight loss. Risk factors for coronary artery disease include male sex, hypertension, obesity, dyslipidemia and diabetes mellitus. Current diabetic treatment includes oral agent (triple therapy), insulin injections and diet. Diabetic current diet: regular diet. He rarely participates in exercise. His breakfast blood glucose range is generally 140-180 mg/dl. (Fasting 140-150, 180s in the afternoons) An ACE inhibitor/angiotensin II receptor blocker is being taken. He sees a podiatrist. Hypertension This is a chronic problem. The problem is controlled. Pertinent negatives include no blurred vision, chest pain, malaise/fatigue, neck pain, orthopnea, palpitations, peripheral edema, PND, shortness of breath or sweats. Past treatments include angiotensin blockers, calcium channel blockers,  beta blockers and central alpha agonists. Compliance problems include diet and exercise.  Hypertensive end-organ damage includes kidney disease. Identifiable causes of hypertension include chronic renal disease.    Past Medical History:  Diagnosis Date   Diabetes mellitus    Frozen shoulder syndrome 2013   Right shoulder.  Has had PT.   Hypercholesterolemia    Hypertension       Review of Systems  Constitutional:  Negative for fatigue, malaise/fatigue and weight loss.  Eyes:  Negative for blurred vision.  Respiratory:  Negative for shortness of breath.   Cardiovascular:  Negative for chest pain, palpitations, orthopnea and PND.  Musculoskeletal:  Negative for myalgias and neck pain.  Neurological:  Negative for focal weakness and weakness.  Endo/Heme/Allergies:  Negative for polydipsia and polyphagia.      Objective:     BP 121/70   Pulse 69   Temp (!) 97.1 F (36.2 C) (Temporal)   Ht 6' (1.829 m)   Wt (!) 378 lb 6 oz (171.6 kg)   SpO2 94%   BMI 51.32 kg/m  BP Readings from Last 3 Encounters:  04/12/22 121/70  02/15/22 132/81  01/09/22 139/78   Wt Readings from Last 3 Encounters:  04/12/22 (!) 378 lb 6 oz (171.6 kg)  01/09/22 (!) 381 lb 2 oz (172.9 kg)  09/03/21 (!) 389 lb 12.8 oz (176.8 kg)      Physical Exam Vitals and nursing note reviewed.  Constitutional:      General: He is not in acute distress.    Appearance: He is obese. He is not ill-appearing, toxic-appearing or diaphoretic.  HENT:     Head: Normocephalic and atraumatic.     Nose: Nose normal.     Mouth/Throat:     Mouth: Mucous membranes are moist.     Pharynx: Oropharynx is clear.  Eyes:  Extraocular Movements: Extraocular movements intact.     Pupils: Pupils are equal, round, and reactive to light.  Neck:     Vascular: No carotid bruit.  Cardiovascular:     Rate and Rhythm: Normal rate and regular rhythm.     Heart sounds: Normal heart sounds. No murmur heard. Pulmonary:     Effort:  Pulmonary effort is normal.     Breath sounds: Normal breath sounds.  Abdominal:     General: Bowel sounds are normal. There is no distension.     Palpations: Abdomen is soft.     Tenderness: There is no abdominal tenderness. There is no guarding or rebound.  Musculoskeletal:     Cervical back: Neck supple.     Right lower leg: No edema.     Left lower leg: No edema.  Skin:    General: Skin is warm and dry.  Neurological:     General: No focal deficit present.     Mental Status: He is alert and oriented to person, place, and time.     Motor: No weakness.  Psychiatric:        Mood and Affect: Mood normal.        Behavior: Behavior normal.      No results found for any visits on 04/12/22.  Last CBC Lab Results  Component Value Date   WBC 7.1 01/09/2022   HGB 14.7 01/09/2022   HCT 43.0 01/09/2022   MCV 80 01/09/2022   MCH 27.3 01/09/2022   RDW 14.1 01/09/2022   PLT 261 12/45/8099   Last metabolic panel Lab Results  Component Value Date   GLUCOSE 160 (H) 01/09/2022   NA 139 01/09/2022   K 4.3 01/09/2022   CL 98 01/09/2022   CO2 26 01/09/2022   BUN 21 01/09/2022   CREATININE 1.51 (H) 01/09/2022   EGFR 52 (L) 01/09/2022   CALCIUM 9.5 01/09/2022   PHOS 3.2 05/30/2021   PROT 7.8 01/09/2022   ALBUMIN 4.1 01/09/2022   LABGLOB 3.7 01/09/2022   AGRATIO 1.1 (L) 01/09/2022   BILITOT 0.5 01/09/2022   ALKPHOS 144 (H) 01/09/2022   AST 24 01/09/2022   ALT 33 01/09/2022   Last lipids Lab Results  Component Value Date   CHOL 126 01/09/2022   HDL 24 (L) 01/09/2022   LDLCALC 67 01/09/2022   TRIG 209 (H) 01/09/2022   CHOLHDL 5.3 (H) 01/09/2022   Last hemoglobin A1c Lab Results  Component Value Date   HGBA1C 12.0 (H) 01/09/2022      The ASCVD Risk score (Arnett DK, et al., 2019) failed to calculate for the following reasons:   The valid total cholesterol range is 130 to 320 mg/dL    Assessment & Plan:   Jon Moore was seen today for medical management of chronic  issues, hyperlipidemia, diabetes and hypertension.  Diagnoses and all orders for this visit:  Uncontrolled type 2 diabetes mellitus with hyperglycemia ([place]) Uncontrolled. A1c is down from 12.0 to 9.7 today however. Discuss to titrate Lantus up by 2 units every 2-3 days to a max of 20 units. On max on soliqua. Also on metformin and farxiga. Follow up with [name] in 4 weeks. Per Julies last note, may consider stronger GLP1. Discussed compliance with diet.  -     Bayer DCA Hb A1c Waived  Hypertension associated with diabetes ([place]) Well controlled on current regimen. On coreg, amlodipin-olmesartan, clonidine.  -     CMP14+EGFR -     CBC with Differential/Platelet  Hyperlipidemia associated  with type 2 diabetes mellitus ([place]) On statin. Labs pending. May need dose increase pending labs.  -     Lipid panel  Stage 3a chronic kidney disease ([place]) On farxiga.   Morbid obesity ([place]) Down 3 lbs since last visit. Diet and exercise.    Return in about 3 months (around 07/13/2022) for chronic follow up. See [name] in 4 weeks for DM.   The patient indicates understanding of these issues and agrees with the plan.  Gwenlyn Perking, FNP

## 2022-04-15 ENCOUNTER — Other Ambulatory Visit: Payer: Self-pay | Admitting: Family Medicine

## 2022-04-15 DIAGNOSIS — R809 Proteinuria, unspecified: Secondary | ICD-10-CM

## 2022-04-15 DIAGNOSIS — M65342 Trigger finger, left ring finger: Secondary | ICD-10-CM | POA: Diagnosis not present

## 2022-04-15 DIAGNOSIS — M25642 Stiffness of left hand, not elsewhere classified: Secondary | ICD-10-CM | POA: Diagnosis not present

## 2022-04-15 DIAGNOSIS — N1831 Chronic kidney disease, stage 3a: Secondary | ICD-10-CM

## 2022-04-15 MED ORDER — KERENDIA 10 MG PO TABS: 10.0000 mg | ORAL_TABLET | Freq: Every day | ORAL | 3 refills | Status: DC

## 2022-04-16 ENCOUNTER — Telehealth: Payer: Self-pay

## 2022-04-16 NOTE — Telephone Encounter (Signed)
Jon Moore Key: [id number] - Rx #: [id number] Need help? Call us at [phone] Outcome Approvedtoday Effective from 04/16/2022 through 04/15/2023. Drug Kerendia 10MG  tablets  Pharmacy informed

## 2022-04-17 NOTE — Progress Notes (Signed)
    04/02/2022 Name: Jon Moore   MRN: [id number]       DOB: 02-Dec-1960     S: 60yoM Presents for diabetes evaluation, education, and management.  Patient's t2dm has been poorly controlled for years.  He has difficulty with lifestyle changes mostly due to chronic joint pain.  He has made improvements in his glycemic control since his last appt.  Insurance coverage/medication affordability: BCBS   Patient reports adherence with medications. Current diabetes medications include: farxiga, soliqua, lantus, metformin Current hypertension medications include: coreg, clonidine, amlodipine, olmesartan Goal 130/80 Current hyperlipidemia medications include: rosuvastatin   Current exercise: none due to knee/joint pain   Patient denies hypoglycemic events.     O:   Recent Labs       Lab Results  Component Value Date    HGBA1C 12.0 (H) 01/09/2022        Lipid Panel   Labs (Brief)          Component Value Date/Time    CHOL 126 01/09/2022 0818    TRIG 209 (H) 01/09/2022 0818    HDL 24 (L) 01/09/2022 0818    CHOLHDL 5.3 (H) 01/09/2022 0818    LDLCALC 67 01/09/2022 0818         Home fasting blood sugars: 130-150   2 hour post-meal/random blood sugars: n/a.     Clinical Atherosclerotic Cardiovascular Disease (ASCVD): No    The ASCVD Risk score (Arnett DK, et al., 2019) failed to calculate for the following reasons:   The valid total cholesterol range is 130 to 320 mg/dL     A/P:   Diabetes D1SH currently uncontrolled due to diet, lifestyle and recent steroid injections.  Currently on max dose soliqua (60 units dialy), farxiga, metformin.  May consider discontinuation of soliqua and going with stronger GLP1/GIP plus concentrated basal insulin.  Will continue to follow.  May also need meal time insulin in the future. Watch GFR 52 (on sglt2 for protection now)     -Continue Soliqua max dose of 60 units daily -increase basal lantus to 10-20 units nightly to try and achieve  optimal control (additional 10-20 units nightly as discussed)             Denies personal and family history of Medullary thyroid cancer (MTC)  Will repeat a1c and discuss future options  Likely transition to ozempic/lantus in the place of soliqua/lantus   -Continue farxiga/metformin  -Continue all other medications   -Extensively discussed pathophysiology of diabetes, recommended lifestyle interventions, dietary effects on blood sugar control   -Counseled on s/sx of and management of hypoglycemia   -Next A1C anticipated 3 months.       Written patient instructions provided.  Total time in face to face counseling 25 minutes.    Follow up with PharmD in 1 month     Jon Moore, PharmD, BCPS Clinical Pharmacist, [facility]  II Phone [phone]

## 2022-04-18 ENCOUNTER — Encounter: Payer: Self-pay | Admitting: Family Medicine

## 2022-04-24 DIAGNOSIS — M65342 Trigger finger, left ring finger: Secondary | ICD-10-CM | POA: Diagnosis not present

## 2022-04-24 DIAGNOSIS — M25642 Stiffness of left hand, not elsewhere classified: Secondary | ICD-10-CM | POA: Diagnosis not present

## 2022-04-28 ENCOUNTER — Other Ambulatory Visit: Payer: Self-pay | Admitting: Family Medicine

## 2022-05-01 ENCOUNTER — Other Ambulatory Visit: Payer: Self-pay | Admitting: Family Medicine

## 2022-05-01 DIAGNOSIS — M65342 Trigger finger, left ring finger: Secondary | ICD-10-CM | POA: Diagnosis not present

## 2022-05-01 DIAGNOSIS — M25642 Stiffness of left hand, not elsewhere classified: Secondary | ICD-10-CM | POA: Diagnosis not present

## 2022-05-06 DIAGNOSIS — M65342 Trigger finger, left ring finger: Secondary | ICD-10-CM | POA: Diagnosis not present

## 2022-05-06 DIAGNOSIS — Z6841 Body Mass Index (BMI) 40.0 and over, adult: Secondary | ICD-10-CM | POA: Diagnosis not present

## 2022-05-06 DIAGNOSIS — M25642 Stiffness of left hand, not elsewhere classified: Secondary | ICD-10-CM | POA: Diagnosis not present

## 2022-05-08 ENCOUNTER — Ambulatory Visit (INDEPENDENT_AMBULATORY_CARE_PROVIDER_SITE_OTHER): Payer: BC Managed Care – PPO | Admitting: Pharmacist

## 2022-05-08 VITALS — BP 125/76 | HR 62

## 2022-05-08 DIAGNOSIS — E1165 Type 2 diabetes mellitus with hyperglycemia: Secondary | ICD-10-CM

## 2022-05-08 DIAGNOSIS — E785 Hyperlipidemia, unspecified: Secondary | ICD-10-CM | POA: Diagnosis not present

## 2022-05-08 DIAGNOSIS — E1169 Type 2 diabetes mellitus with other specified complication: Secondary | ICD-10-CM | POA: Diagnosis not present

## 2022-05-08 DIAGNOSIS — N1831 Chronic kidney disease, stage 3a: Secondary | ICD-10-CM

## 2022-05-08 MED ORDER — SEMAGLUTIDE(0.25 OR 0.5MG/DOS) 2 MG/3ML ~~LOC~~ SOPN: 0.5000 mg | PEN_INJECTOR | SUBCUTANEOUS | 4 refills | Status: DC

## 2022-05-08 NOTE — Progress Notes (Signed)
    05/08/2022 Name: Jon Moore   MRN: [id number]       DOB: Mar 23, 1961     S: 63yoM Presents for diabetes evaluation, education, and management.  Patient's t2dm has been poorly controlled for years.  He has difficulty with lifestyle changes mostly due to chronic joint pain.  He has made improvements in his glycemic control since his last appt.   Insurance coverage/medication affordability: BCBS   Patient reports adherence with medications. Current diabetes medications include: farxiga, soliqua, lantus, metformin Current hypertension medications include: coreg, clonidine, amlodipine, olmesartan Goal 130/80 Current hyperlipidemia medications include: rosuvastatin   Current exercise: none due to knee/joint pain   Patient denies hypoglycemic events.     O:      Lipid Panel   Labs (Brief)              Component Value Date/Time    CHOL 126 01/09/2022 0818    TRIG 209 (H) 01/09/2022 0818    HDL 24 (L) 01/09/2022 0818    CHOLHDL 5.3 (H) 01/09/2022 0818    LDLCALC 67 01/09/2022 0818         Home fasting blood sugars: 130-150   2 hour post-meal/random blood sugars: n/a.     Clinical Atherosclerotic Cardiovascular Disease (ASCVD): No    The ASCVD Risk score (Arnett DK, et al., 2019) failed to calculate for the following reasons:   The valid total cholesterol range is 130 to 320 mg/dL     A/P:   Diabetes T2DM currently uncontrolled due to diet, lifestyle and recent steroid injections.  May also need meal time insulin in the future. Watch GFR 52 -->45(on sglt2 for protection now)     -Discontinue Soliqua   -Start Ozempic 0.25mg  sq weekly (sample given)  Denies personal and family history of Medullary thyroid cancer (MTC)  Will titrate to 0.5mg  weekly on week 5  -INCREASE lantus to 50-60 units nightly  -Continue farxiga/metformin   -Continue all other medications   -Extensively discussed pathophysiology of diabetes, recommended lifestyle interventions, dietary  effects on blood sugar control   -Counseled on s/sx of and management of hypoglycemia   -Next A1C anticipated 3 months.       Written patient instructions provided.  Total time in face to face counseling 25 minutes.    Follow up with PharmD in 1 month     Regina Eck, PharmD, BCPS Clinical Pharmacist, [place]  II Phone [phone]

## 2022-05-14 MED ORDER — LANTUS SOLOSTAR 100 UNIT/ML ~~LOC~~ SOPN: 60.0000 [IU] | PEN_INJECTOR | Freq: Every day | SUBCUTANEOUS | 5 refills | Status: DC

## 2022-05-15 ENCOUNTER — Telehealth: Payer: BC Managed Care – PPO

## 2022-06-01 ENCOUNTER — Other Ambulatory Visit: Payer: Self-pay | Admitting: Family Medicine

## 2022-06-04 ENCOUNTER — Encounter: Payer: Self-pay | Admitting: Family Medicine

## 2022-06-04 ENCOUNTER — Ambulatory Visit (INDEPENDENT_AMBULATORY_CARE_PROVIDER_SITE_OTHER): Payer: BC Managed Care – PPO | Admitting: Pharmacist

## 2022-06-04 DIAGNOSIS — E1165 Type 2 diabetes mellitus with hyperglycemia: Secondary | ICD-10-CM

## 2022-06-04 DIAGNOSIS — E785 Hyperlipidemia, unspecified: Secondary | ICD-10-CM | POA: Diagnosis not present

## 2022-06-04 DIAGNOSIS — E1169 Type 2 diabetes mellitus with other specified complication: Secondary | ICD-10-CM | POA: Diagnosis not present

## 2022-06-04 MED ORDER — SEMAGLUTIDE (1 MG/DOSE) 4 MG/3ML ~~LOC~~ SOPN: 1.0000 mg | PEN_INJECTOR | SUBCUTANEOUS | 5 refills | Status: DC

## 2022-06-04 MED ORDER — LANTUS SOLOSTAR 100 UNIT/ML ~~LOC~~ SOPN: 50.0000 [IU] | PEN_INJECTOR | Freq: Every day | SUBCUTANEOUS | 5 refills | Status: DC

## 2022-06-04 NOTE — Progress Notes (Signed)
     06/04/2022 Name: Jon Moore MRN: [id number] DOB: 1960-09-15     S: 86yoM Presents for diabetes evaluation, education, and management.  Patient's t2dm has been poorly controlled for years.  He has difficulty with lifestyle changes mostly due to chronic joint pain.  He has made improvements in his glycemic control since his last appt. He is tolerating ozempic well and reports 18lb weight lost and his sugar is now controlled.   Insurance coverage/medication affordability: BCBS   Patient reports adherence with medications. Current diabetes medications include: farxiga,OZEMPIC, lantus, metformin Current hypertension medications include: coreg, clonidine, amlodipine, olmesartan Goal 130/80 Current hyperlipidemia medications include: rosuvastatin   Current exercise: none due to knee/joint pain   Patient denies hypoglycemic events.    O:  Lab Results  Component Value Date   HGBA1C 9.7 (H) 04/12/2022    There were no vitals filed for this visit.     Lipid Panel     Component Value Date/Time   CHOL 135 04/12/2022 0849   TRIG 229 (H) 04/12/2022 0849   HDL 28 (L) 04/12/2022 0849   CHOLHDL 4.8 04/12/2022 0849   LDLCALC 69 04/12/2022 0849     Home fasting blood sugars: <130  2 hour post-meal/random blood sugars: n/a.    Clinical Atherosclerotic Cardiovascular Disease (ASCVD): No   The 10-year ASCVD risk score (Arnett DK, et al., 2019) is: 26%   Values used to calculate the score:     Age: 66 years     Sex: Male     Is Non-Hispanic African American: Yes     Diabetic: Yes     Tobacco smoker: No     Systolic Blood Pressure: 924 mmHg     Is BP treated: Yes     HDL Cholesterol: 28 mg/dL     Total Cholesterol: 135 mg/dL    A/P:   Diabetes T2DM currently controlled now that patient is on basal/glp/sglt2/met.  He reports 18lb weight loss as well     -Increase Ozempic to 0.$RemoveBefo'5mg'pkIRiDGZGAL$ . Weekly for 4 weeks, then increase to $RemoveBef'1mg'WCrluakgPM$  weekly             Denies personal and family  history of Medullary thyroid cancer (MTC)               -DECREASE lantus to 50 units nightly   -Continue farxiga/metformin   -Continue all other medications  -Extensively discussed pathophysiology of diabetes, recommended lifestyle interventions, dietary effects on blood sugar control  -Counseled on s/sx of and management of hypoglycemia  -Next A1C anticipated 3-6 MONTHS.  Written patient instructions provided.  Total time in face to face counseling 25 minutes.     Regina Eck, PharmD, BCPS Clinical Pharmacist, [place]  II Phone [phone]

## 2022-06-14 ENCOUNTER — Encounter: Payer: Self-pay | Admitting: Pharmacist

## 2022-06-14 ENCOUNTER — Other Ambulatory Visit: Payer: Self-pay | Admitting: Family Medicine

## 2022-06-14 ENCOUNTER — Telehealth: Payer: Self-pay | Admitting: Pharmacist

## 2022-06-14 DIAGNOSIS — E1165 Type 2 diabetes mellitus with hyperglycemia: Secondary | ICD-10-CM

## 2022-06-14 MED ORDER — INSULIN GLARGINE-YFGN 100 UNIT/ML ~~LOC~~ SOPN: 50.0000 [IU] | PEN_INJECTOR | Freq: Every day | SUBCUTANEOUS | 6 refills | Status: DC

## 2022-06-14 NOTE — Telephone Encounter (Signed)
PA started Looks like insurance may require semglee

## 2022-06-14 NOTE — Telephone Encounter (Signed)
Denied prefers semglee Will message patient

## 2022-06-20 DIAGNOSIS — B351 Tinea unguium: Secondary | ICD-10-CM | POA: Diagnosis not present

## 2022-06-20 DIAGNOSIS — M79676 Pain in unspecified toe(s): Secondary | ICD-10-CM | POA: Diagnosis not present

## 2022-07-09 ENCOUNTER — Other Ambulatory Visit: Payer: Self-pay | Admitting: Family Medicine

## 2022-07-09 DIAGNOSIS — E1165 Type 2 diabetes mellitus with hyperglycemia: Secondary | ICD-10-CM

## 2022-07-09 DIAGNOSIS — N1831 Chronic kidney disease, stage 3a: Secondary | ICD-10-CM

## 2022-07-17 ENCOUNTER — Encounter: Payer: Self-pay | Admitting: Family Medicine

## 2022-07-17 ENCOUNTER — Ambulatory Visit: Payer: BC Managed Care – PPO | Admitting: Family Medicine

## 2022-07-17 VITALS — BP 123/60 | HR 70 | Temp 97.3°F | Ht 72.0 in | Wt 357.4 lb

## 2022-07-17 DIAGNOSIS — E1169 Type 2 diabetes mellitus with other specified complication: Secondary | ICD-10-CM | POA: Diagnosis not present

## 2022-07-17 DIAGNOSIS — E1159 Type 2 diabetes mellitus with other circulatory complications: Secondary | ICD-10-CM

## 2022-07-17 DIAGNOSIS — E1165 Type 2 diabetes mellitus with hyperglycemia: Secondary | ICD-10-CM | POA: Diagnosis not present

## 2022-07-17 DIAGNOSIS — N1831 Chronic kidney disease, stage 3a: Secondary | ICD-10-CM | POA: Diagnosis not present

## 2022-07-17 DIAGNOSIS — E785 Hyperlipidemia, unspecified: Secondary | ICD-10-CM

## 2022-07-17 DIAGNOSIS — I152 Hypertension secondary to endocrine disorders: Secondary | ICD-10-CM

## 2022-07-17 LAB — CMP14+EGFR
ALT: 23 IU/L (ref 0–44)
AST: 18 IU/L (ref 0–40)
Albumin/Globulin Ratio: 1.3 (ref 1.2–2.2)
Albumin: 4.2 g/dL (ref 3.9–4.9)
Alkaline Phosphatase: 111 IU/L (ref 44–121)
BUN/Creatinine Ratio: 17 (ref 10–24)
BUN: 30 mg/dL — ABNORMAL HIGH (ref 8–27)
Bilirubin Total: 0.4 mg/dL (ref 0.0–1.2)
CO2: 24 mmol/L (ref 20–29)
Calcium: 9.5 mg/dL (ref 8.6–10.2)
Chloride: 107 mmol/L — ABNORMAL HIGH (ref 96–106)
Creatinine, Ser: 1.79 mg/dL — ABNORMAL HIGH (ref 0.76–1.27)
Globulin, Total: 3.3 g/dL (ref 1.5–4.5)
Glucose: 77 mg/dL (ref 70–99)
Potassium: 4.3 mmol/L (ref 3.5–5.2)
Sodium: 146 mmol/L — ABNORMAL HIGH (ref 134–144)
Total Protein: 7.5 g/dL (ref 6.0–8.5)
eGFR: 42 mL/min/{1.73_m2} — ABNORMAL LOW (ref >59)

## 2022-07-17 LAB — BAYER DCA HB A1C WAIVED: HB A1C (BAYER DCA - WAIVED): 7.8 % — ABNORMAL HIGH (ref 4.8–5.6)

## 2022-07-17 NOTE — Progress Notes (Signed)
Established Patient Office Visit  Subjective   Patient ID: Jon Moore, male    DOB: Mar 24, 1961  Age: 61 y.o. MRN: [id number]  Chief Complaint  Patient presents with   Medical Management of Chronic Issues   Diabetes    Diabetes    DM Pt presents for followup evaluation of Type 2 diabetes mellitus. Patient denies foot ulcerations, increased appetite, nausea, paresthesia of the feet, polydipsia, polyuria, visual disturbances, vomiting, and weight loss.  Current diabetic medications include: ozempic 1 mg for 2 weeks, semglee 55-60 units, metformin 500 mg BID, farxiga 10 mg Compliant with meds - Yes  Current monitoring regimen: home blood tests - daily Home blood sugar records: fasting range: 80s, 120s pre prandial Any episodes of hypoglycemia? yes - occasionally, is symptomatic when this occurs  Eye exam current (within one year): yes Podiatry yearly?  No Weight trend: decreasing steadily Current diet: diabetic Current exercise: none  Urine microalbumin UTD? Yes  Is He on ACE inhibitor or angiotensin II receptor blocker?  Yes, olmesartan Is He on statin? Yes crestor  2. HTN Complaint with meds - Yes Current Medications - amlodipine-olmesartan 10-40 mg, coreg 25 mg BID, clonidine 0.1 mg BID, lasix 80 mg,  Checking BP at home ranging: 120s/60s Pertinent ROS:  Fatigue - No Visual Disturbances - No Chest pain - No Dyspnea - No Palpitations - No  3. CKD On ARB, farxiga, and karendia.   Past Medical History:  Diagnosis Date   Diabetes mellitus    Frozen shoulder syndrome 2013   Right shoulder.  Has had PT.   Hypercholesterolemia    Hypertension       ROS As per HPI.    Objective:     BP 123/60   Pulse 70   Temp (!) 97.3 F (36.3 C) (Temporal)   Ht 6' (1.829 m)   Wt (!) 357 lb 6 oz (162.1 kg)   BMI 48.47 kg/m  Wt Readings from Last 3 Encounters:  07/17/22 (!) 357 lb 6 oz (162.1 kg)  04/12/22 (!) 378 lb 6 oz (171.6 kg)  01/09/22 (!) 381 lb 2 oz  (172.9 kg)     Physical Exam Vitals and nursing note reviewed.  Constitutional:      General: He is not in acute distress.    Appearance: He is obese. He is not ill-appearing, toxic-appearing or diaphoretic.  HENT:     Head: Normocephalic and atraumatic.     Right Ear: Tympanic membrane, ear canal and external ear normal.     Left Ear: Tympanic membrane, ear canal and external ear normal.  Neck:     Thyroid: No thyroid mass, thyromegaly or thyroid tenderness.     Vascular: No carotid bruit.  Cardiovascular:     Rate and Rhythm: Normal rate and regular rhythm.     Heart sounds: Normal heart sounds. No murmur heard. Pulmonary:     Effort: Pulmonary effort is normal. No respiratory distress.     Breath sounds: Normal breath sounds.  Abdominal:     General: Bowel sounds are normal. There is no distension.     Palpations: Abdomen is soft.     Tenderness: There is no abdominal tenderness. There is no guarding or rebound.  Musculoskeletal:     Right lower leg: No edema.     Left lower leg: No edema.  Skin:    General: Skin is warm and dry.  Neurological:     General: No focal deficit present.     Mental Status:  He is alert and oriented to person, place, and time.     Motor: No weakness.     Gait: Gait normal.  Psychiatric:        Mood and Affect: Mood normal.        Behavior: Behavior normal.        Thought Content: Thought content normal.        Judgment: Judgment normal.      No results found for any visits on 07/17/22.  Last CBC Lab Results  Component Value Date   WBC 6.9 04/12/2022   HGB 14.9 04/12/2022   HCT 43.9 04/12/2022   MCV 80 04/12/2022   MCH 27.2 04/12/2022   RDW 13.8 04/12/2022   PLT 260 46/21/9471   Last metabolic panel Lab Results  Component Value Date   GLUCOSE 107 (H) 04/12/2022   NA 142 04/12/2022   K 4.3 04/12/2022   CL 103 04/12/2022   CO2 24 04/12/2022   BUN 25 04/12/2022   CREATININE 1.71 (H) 04/12/2022   EGFR 45 (L) 04/12/2022    CALCIUM 9.3 04/12/2022   PHOS 3.2 05/30/2021   PROT 7.4 04/12/2022   ALBUMIN 4.0 04/12/2022   LABGLOB 3.4 04/12/2022   AGRATIO 1.2 04/12/2022   BILITOT 0.4 04/12/2022   ALKPHOS 144 (H) 04/12/2022   AST 19 04/12/2022   ALT 22 04/12/2022   Last lipids Lab Results  Component Value Date   CHOL 135 04/12/2022   HDL 28 (L) 04/12/2022   LDLCALC 69 04/12/2022   TRIG 229 (H) 04/12/2022   CHOLHDL 4.8 04/12/2022   Last hemoglobin A1c Lab Results  Component Value Date   HGBA1C 9.7 (H) 04/12/2022      The 10-year ASCVD risk score (Arnett DK, et al., 2019) is: 25.4%    Assessment & Plan:   Little was seen today for medical management of chronic issues and diabetes.  Diagnoses and all orders for this visit:  Uncontrolled type 2 diabetes mellitus with hyperglycemia ([facility]) A1c in 7.8, improving but not at goal yet. Blood sugars are well controlled however, since increasing ozempic for 1 mg 2 weeks ago. Continue ozempic, metformin, farxiga, and semglee. He will follow up with [name] in 4-6 weeks to discuss medication management. Foot exam, eye exam are UTD. Microalbumin today. On ARB and statin.  -     Microalbumin / creatinine urine ratio -     Bayer DCA Hb A1c Waived  Hypertension associated with diabetes ([place]) Well controlled on current regimen. Continue amlodipine, olmesartan, coreg, clonidine, and lasix.   Hyperlipidemia associated with type 2 diabetes mellitus ([place]) On crestor. Last LDL was 69  Stage 3a chronic kidney disease ([place]) Labs pending. On ARB, farxiga, and [place].  -     CMP14+EGFR  Morbid obesity ([facility]) Down 21 lbs since last visit. Continue ozempic and diet.   Return in about 3 months (around 10/17/2022) for chronic follow up with me, 4-6 weeks for [name] for DM.  The patient indicates understanding of these issues and agrees with the plan.    Gwenlyn Perking, FNP

## 2022-07-19 LAB — MICROALBUMIN / CREATININE URINE RATIO
Creatinine, Urine: 214.8 mg/dL
Microalb/Creat Ratio: 9 mg/g creat (ref 0–29)
Microalbumin, Urine: 18.5 ug/mL

## 2022-07-28 ENCOUNTER — Other Ambulatory Visit: Payer: Self-pay | Admitting: Family Medicine

## 2022-08-20 ENCOUNTER — Other Ambulatory Visit: Payer: Self-pay | Admitting: Family Medicine

## 2022-08-20 ENCOUNTER — Encounter: Payer: Self-pay | Admitting: Family Medicine

## 2022-08-20 DIAGNOSIS — M1039 Gout due to renal impairment, multiple sites: Secondary | ICD-10-CM

## 2022-08-20 NOTE — Telephone Encounter (Signed)
Allopurinol is a prevenative med- it doe sot treat acute out- he will need an appointment for treatment

## 2022-08-22 MED ORDER — ALLOPURINOL 100 MG PO TABS: 100.0000 mg | ORAL_TABLET | Freq: Every day | ORAL | 1 refills | Status: DC

## 2022-08-22 NOTE — Addendum Note (Signed)
Addended by: Milas Hock on: 08/22/2022 05:13 PM   Modules accepted: Orders

## 2022-08-29 DIAGNOSIS — M79676 Pain in unspecified toe(s): Secondary | ICD-10-CM | POA: Diagnosis not present

## 2022-08-29 DIAGNOSIS — B351 Tinea unguium: Secondary | ICD-10-CM | POA: Diagnosis not present

## 2022-08-31 ENCOUNTER — Other Ambulatory Visit: Payer: Self-pay | Admitting: Family Medicine

## 2022-09-09 ENCOUNTER — Other Ambulatory Visit: Payer: Self-pay | Admitting: Family Medicine

## 2022-09-22 ENCOUNTER — Other Ambulatory Visit: Payer: Self-pay | Admitting: Family Medicine

## 2022-10-01 ENCOUNTER — Ambulatory Visit (INDEPENDENT_AMBULATORY_CARE_PROVIDER_SITE_OTHER): Payer: BC Managed Care – PPO | Admitting: Pharmacist

## 2022-10-01 DIAGNOSIS — Z794 Long term (current) use of insulin: Secondary | ICD-10-CM

## 2022-10-01 DIAGNOSIS — E1165 Type 2 diabetes mellitus with hyperglycemia: Secondary | ICD-10-CM | POA: Diagnosis not present

## 2022-10-01 NOTE — Progress Notes (Signed)
    S:    T2 DIABETES  62 y.o. male who presents for diabetes evaluation, education, and management. PMH is significant for T2DM, HTN, HLD, OA of knee, CKD, and gout. Patient last seen by Primary Care Provider, Marjorie Smolder, FNP, on 07/17/2022.   Today, patient arrives in good spirits and presents without any assistance. Reports he self decreased his Semglee from 60 units to 50 units due to hypoglycemia. Patient has been tolerated Ozempic 1 mg weekly since December. Denies GI side effects. States his blood sugar has been well controlled. No issues with medications reported.   Current diabetes medications include: Ozempic 1 mg weekly, Semglee 50, Farxiga 10 mg daily, metformin 500 mg BID Current hyperlipidemia medications include: rosuvastatin 5 mg daily  Patient reports adherence to taking all medications as prescribed.   Insurance coverage: BC[employer]  Patient denies hypoglycemic events since decreasing Semglee to 50 units.  Reported home fasting blood sugars: 75-90, none >100 mg/dL  Reported 2 hour post-meal/random blood sugars: 130.  Patient denies nocturia (nighttime urination).  Patient denies neuropathy (nerve pain). Patient denies visual changes. Patient reports self foot exams.   Patient reported dietary habits: Eats 3 meals/day Lunch: fruit Dinner: chicken, potatoes Snacks: fruit  Drinks: water (60 oz), zero soda    O:  Lab Results  Component Value Date   HGBA1C 7.8 (H) 07/17/2022   Lipid Panel     Component Value Date/Time   CHOL 135 04/12/2022 0849   TRIG 229 (H) 04/12/2022 0849   HDL 28 (L) 04/12/2022 0849   CHOLHDL 4.8 04/12/2022 0849   LDLCALC 69 04/12/2022 0849    Clinical Atherosclerotic Cardiovascular Disease (ASCVD): No  The 10-year ASCVD risk score (Arnett DK, et al., 2019) is: 25.4%   Values used to calculate the score:     Age: 72 years     Sex: Male     Is Non-Hispanic African American: Yes     Diabetic: Yes     Tobacco smoker: No      Systolic Blood Pressure: 093 mmHg     Is BP treated: Yes     HDL Cholesterol: 28 mg/dL     Total Cholesterol: 135 mg/dL    A/P: Diabetes longstanding, currently close to goal based on A1c (7.8% in November 2023). Patient is able to verbalize appropriate hypoglycemia management plan. Fasting blood sugar on the lower end of normal. Will plan titrate Ozempic and decrease insulin dose. Medication adherence appears appropriate. -Decreased dose of basal insulin Semglee to 40 units daily. Instructed patient to contact the clinic and further decrease insulin dose if hypoglycemia occurs with the titration of Ozempic.  -Increased dose of GLP-1 Ozempic to 2 mg. Patient denies GI side effects. -Continued SGLT2-I Farxiga 10 mg daily. Counseled on sick day rules. -Continued metformin 500 mg BID.  -Patient educated on purpose, proper use, and potential adverse effects of insulin, Ozempic, Farxiga, and metformin.  -Extensively discussed pathophysiology of diabetes, recommended lifestyle interventions, dietary effects on blood sugar control.  -Counseled on s/sx of and management of hypoglycemia.  -Next A1c anticipated at PCP follow up.   Patient verbalized understanding of treatment plan.  Total time in face to face counseling 30 minutes.    Follow-up:  Pharmacist PRN. PCP clinic visit in 2 weeks.   Joseph Art, Pharm.D. PGY-2 Ambulatory Care Pharmacy Resident 10/01/2022 1:58 PM  Regina Eck, PharmD, BCPS, BCACP Clinical Pharmacist, [place]  [facility]  T [phone]

## 2022-10-03 MED ORDER — SEMAGLUTIDE (2 MG/DOSE) 8 MG/3ML ~~LOC~~ SOPN: 2.0000 mg | PEN_INJECTOR | SUBCUTANEOUS | 4 refills | Status: DC

## 2022-10-05 ENCOUNTER — Other Ambulatory Visit: Payer: Self-pay | Admitting: Family Medicine

## 2022-10-05 DIAGNOSIS — N1831 Chronic kidney disease, stage 3a: Secondary | ICD-10-CM

## 2022-10-05 DIAGNOSIS — E1165 Type 2 diabetes mellitus with hyperglycemia: Secondary | ICD-10-CM

## 2022-10-17 ENCOUNTER — Encounter: Payer: Self-pay | Admitting: Family Medicine

## 2022-10-17 ENCOUNTER — Ambulatory Visit: Payer: BC Managed Care – PPO | Admitting: Family Medicine

## 2022-10-17 VITALS — BP 125/72 | HR 80 | Temp 97.5°F | Ht 72.0 in | Wt 356.5 lb

## 2022-10-17 DIAGNOSIS — N1831 Chronic kidney disease, stage 3a: Secondary | ICD-10-CM

## 2022-10-17 DIAGNOSIS — E1159 Type 2 diabetes mellitus with other circulatory complications: Secondary | ICD-10-CM

## 2022-10-17 DIAGNOSIS — E1165 Type 2 diabetes mellitus with hyperglycemia: Secondary | ICD-10-CM

## 2022-10-17 DIAGNOSIS — E1169 Type 2 diabetes mellitus with other specified complication: Secondary | ICD-10-CM | POA: Diagnosis not present

## 2022-10-17 DIAGNOSIS — Z794 Long term (current) use of insulin: Secondary | ICD-10-CM

## 2022-10-17 DIAGNOSIS — E1122 Type 2 diabetes mellitus with diabetic chronic kidney disease: Secondary | ICD-10-CM

## 2022-10-17 DIAGNOSIS — I152 Hypertension secondary to endocrine disorders: Secondary | ICD-10-CM

## 2022-10-17 DIAGNOSIS — E785 Hyperlipidemia, unspecified: Secondary | ICD-10-CM

## 2022-10-17 LAB — BAYER DCA HB A1C WAIVED: HB A1C (BAYER DCA - WAIVED): 7.1 % — ABNORMAL HIGH (ref 4.8–5.6)

## 2022-10-17 NOTE — Progress Notes (Signed)
Established Patient Office Visit  Subjective   Patient ID: Jon Moore, male    DOB: 1961-04-30  Age: 62 y.o. MRN: [id number]  Chief Complaint  Patient presents with   Medical Management of Chronic Issues   Diabetes   Chronic Kidney Disease    HPI  DM Pt presents for followup evaluation of Type 2 diabetes mellitus. Patient denies foot ulcerations, increased appetite, nausea, paresthesia of the feet, polydipsia, polyuria, visual disturbances, vomiting, and weight loss.  Current diabetic medications include: ozempic 2 mg (increased 2 weeks ago), semglee 40 units, metformin 500 mg BID, farxiga 10 mg Compliant with meds - Yes  Current monitoring regimen: home blood tests - daily Home blood sugar records: fasting range: 80-90, 110-120 pre prandial Any episodes of hypoglycemia? yes - occasionally, is symptomatic when this occurs  Eye exam current (within one year): yes Podiatry yearly?  No Weight trend: decreasing steadily Current diet: diabetic Current exercise: none  Urine microalbumin UTD? Yes  Is He on ACE inhibitor or angiotensin II receptor blocker?  Yes, olmesartan Is He on statin? Yes crestor  2. HTN Complaint with meds - Yes Current Medications - amlodipine-olmesartan 10-40 mg, coreg 25 mg BID, clonidine 0.1 mg BID, lasix 80 mg,  Checking BP at home ranging: 120s/60-70s Pertinent ROS:  Fatigue - No Visual Disturbances - No Chest pain - No Dyspnea - No Palpitations - No  3. CKD On ARB, farxiga, and karendia.   Past Medical History:  Diagnosis Date   Diabetes mellitus    Frozen shoulder syndrome 2013   Right shoulder.  Has had PT.   Hypercholesterolemia    Hypertension       ROS As per HPI.    Objective:     BP 125/72   Pulse 80   Temp (!) 97.5 F (36.4 C) (Temporal)   Ht 6' (1.829 m)   Wt (!) 356 lb 8 oz (161.7 kg)   SpO2 96%   BMI 48.35 kg/m  Wt Readings from Last 3 Encounters:  10/17/22 (!) 356 lb 8 oz (161.7 kg)  07/17/22 (!) 357 lb  6 oz (162.1 kg)  04/12/22 (!) 378 lb 6 oz (171.6 kg)     Physical Exam Vitals and nursing note reviewed.  Constitutional:      General: He is not in acute distress.    Appearance: He is obese. He is not ill-appearing, toxic-appearing or diaphoretic.  HENT:     Head: Normocephalic and atraumatic.     Right Ear: Tympanic membrane, ear canal and external ear normal.     Left Ear: Tympanic membrane, ear canal and external ear normal.  Neck:     Thyroid: No thyroid mass, thyromegaly or thyroid tenderness.     Vascular: No carotid bruit.  Cardiovascular:     Rate and Rhythm: Normal rate and regular rhythm.     Heart sounds: Normal heart sounds. No murmur heard. Pulmonary:     Effort: Pulmonary effort is normal. No respiratory distress.     Breath sounds: Normal breath sounds.  Abdominal:     General: Bowel sounds are normal. There is no distension.     Palpations: Abdomen is soft.     Tenderness: There is no abdominal tenderness. There is no guarding or rebound.  Musculoskeletal:     Right lower leg: No edema.     Left lower leg: No edema.  Skin:    General: Skin is warm and dry.  Neurological:     General: No focal  deficit present.     Mental Status: He is alert and oriented to person, place, and time.     Motor: No weakness.     Gait: Gait normal.  Psychiatric:        Mood and Affect: Mood normal.        Behavior: Behavior normal.        Thought Content: Thought content normal.        Judgment: Judgment normal.      No results found for any visits on 10/17/22.  Last CBC Lab Results  Component Value Date   WBC 6.9 04/12/2022   HGB 14.9 04/12/2022   HCT 43.9 04/12/2022   MCV 80 04/12/2022   MCH 27.2 04/12/2022   RDW 13.8 04/12/2022   PLT 260 [id number]   Last metabolic panel Lab Results  Component Value Date   GLUCOSE 77 07/17/2022   NA 146 (H) 07/17/2022   K 4.3 07/17/2022   CL 107 (H) 07/17/2022   CO2 24 07/17/2022   BUN 30 (H) 07/17/2022   CREATININE  1.79 (H) 07/17/2022   EGFR 42 (L) 07/17/2022   CALCIUM 9.5 07/17/2022   PHOS 3.2 05/30/2021   PROT 7.5 07/17/2022   ALBUMIN 4.2 07/17/2022   LABGLOB 3.3 07/17/2022   AGRATIO 1.3 07/17/2022   BILITOT 0.4 07/17/2022   ALKPHOS 111 07/17/2022   AST 18 07/17/2022   ALT 23 07/17/2022   Last lipids Lab Results  Component Value Date   CHOL 135 04/12/2022   HDL 28 (L) 04/12/2022   LDLCALC 69 04/12/2022   TRIG 229 (H) 04/12/2022   CHOLHDL 4.8 04/12/2022   Last hemoglobin A1c Lab Results  Component Value Date   HGBA1C 7.8 (H) 07/17/2022      The 10-year ASCVD risk score (Arnett DK, et al., 2019) is: 26%    Assessment & Plan:   Jon Moore was seen today for medical management of chronic issues, diabetes and chronic kidney disease.  Diagnoses and all orders for this visit:  Type 2 diabetes mellitus with hyperglycemia, with long-term current use of insulin (HCC) A1c 7.1 today, not quite at goal of <7. Medication changes today: none as ozempic was just increased 2 days ago. Continue farxiga, metformin, and semglee. He will notify if hypoglycemia events occur- will titrate down insulin. On ACE/ARB and statin. Eye exam: UTD. Foot exam: UTD. Urine micro: UTD. Diet and exercise. Labs pending.  -     Vitamin B12 -     Bayer DCA Hb A1c Waived  Hypertension associated with diabetes ([place]) Well controlled on current regimen. Continue amlodipine, olmesartan, coreg, clonidine, and lasix.  -     CMP14+EGFR -     CBC with Differential/Platelet  Hyperlipidemia associated with type 2 diabetes mellitus (HCC) Last LDL 69. On crestor.   Stage 3a chronic kidney disease ([place]) On ARB, farxiga, karendia. Labs pending.  -     CMP14+EGFR -     VITAMIN D 25 Hydroxy (Vit-D Deficiency, Fractures) -     CBC with Differential/Platelet  Morbid obesity (HCC) Down an additional pound. Diet and exercise.  -     VITAMIN D 25 Hydroxy (Vit-D Deficiency, Fractures)   Return in about 3 months (around  01/15/2023) for chronic follow up.  The patient indicates understanding of these issues and agrees with the plan.    Gwenlyn Perking, FNP

## 2022-10-18 ENCOUNTER — Other Ambulatory Visit: Payer: Self-pay | Admitting: Family Medicine

## 2022-10-18 ENCOUNTER — Encounter: Payer: Self-pay | Admitting: Family Medicine

## 2022-10-18 LAB — CMP14+EGFR
ALT: 33 IU/L (ref 0–44)
AST: 21 IU/L (ref 0–40)
Albumin/Globulin Ratio: 1.1 — ABNORMAL LOW (ref 1.2–2.2)
Albumin: 4.1 g/dL (ref 3.9–4.9)
Alkaline Phosphatase: 131 IU/L — ABNORMAL HIGH (ref 44–121)
BUN/Creatinine Ratio: 19 (ref 10–24)
BUN: 37 mg/dL — ABNORMAL HIGH (ref 8–27)
Bilirubin Total: 0.3 mg/dL (ref 0.0–1.2)
CO2: 24 mmol/L (ref 20–29)
Calcium: 9.6 mg/dL (ref 8.6–10.2)
Chloride: 102 mmol/L (ref 96–106)
Creatinine, Ser: 1.9 mg/dL — ABNORMAL HIGH (ref 0.76–1.27)
Globulin, Total: 3.6 g/dL (ref 1.5–4.5)
Glucose: 79 mg/dL (ref 70–99)
Potassium: 4.5 mmol/L (ref 3.5–5.2)
Sodium: 141 mmol/L (ref 134–144)
Total Protein: 7.7 g/dL (ref 6.0–8.5)
eGFR: 39 mL/min/{1.73_m2} — ABNORMAL LOW (ref >59)

## 2022-10-18 LAB — CBC WITH DIFFERENTIAL/PLATELET
Basophils Absolute: 0.1 10*3/uL (ref 0.0–0.2)
Basos: 1 %
EOS (ABSOLUTE): 0.3 10*3/uL (ref 0.0–0.4)
Eos: 4 %
Hematocrit: 42 % (ref 37.5–51.0)
Hemoglobin: 14.3 g/dL (ref 13.0–17.7)
Immature Grans (Abs): 0 10*3/uL (ref 0.0–0.1)
Immature Granulocytes: 0 %
Lymphocytes Absolute: 3.6 10*3/uL — ABNORMAL HIGH (ref 0.7–3.1)
Lymphs: 44 %
MCH: 27 pg (ref 26.6–33.0)
MCHC: 34 g/dL (ref 31.5–35.7)
MCV: 79 fL (ref 79–97)
Monocytes Absolute: 0.8 10*3/uL (ref 0.1–0.9)
Monocytes: 10 %
Neutrophils Absolute: 3.4 10*3/uL (ref 1.4–7.0)
Neutrophils: 41 %
Platelets: 281 10*3/uL (ref 150–450)
RBC: 5.29 x10E6/uL (ref 4.14–5.80)
RDW: 14 % (ref 11.6–15.4)
WBC: 8.2 10*3/uL (ref 3.4–10.8)

## 2022-10-18 LAB — VITAMIN D 25 HYDROXY (VIT D DEFICIENCY, FRACTURES): Vit D, 25-Hydroxy: 37.5 ng/mL (ref 30.0–100.0)

## 2022-10-18 LAB — VITAMIN B12: Vitamin B-12: 488 pg/mL (ref 232–1245)

## 2022-10-28 ENCOUNTER — Other Ambulatory Visit: Payer: Self-pay | Admitting: Family Medicine

## 2022-10-29 DIAGNOSIS — Z1211 Encounter for screening for malignant neoplasm of colon: Secondary | ICD-10-CM | POA: Diagnosis not present

## 2022-11-07 DIAGNOSIS — B351 Tinea unguium: Secondary | ICD-10-CM | POA: Diagnosis not present

## 2022-11-07 DIAGNOSIS — M79676 Pain in unspecified toe(s): Secondary | ICD-10-CM | POA: Diagnosis not present

## 2022-11-12 ENCOUNTER — Other Ambulatory Visit: Payer: Self-pay | Admitting: Family Medicine

## 2022-11-22 DIAGNOSIS — N189 Chronic kidney disease, unspecified: Secondary | ICD-10-CM | POA: Diagnosis not present

## 2022-11-22 DIAGNOSIS — Z79899 Other long term (current) drug therapy: Secondary | ICD-10-CM | POA: Diagnosis not present

## 2022-11-22 DIAGNOSIS — Z6841 Body Mass Index (BMI) 40.0 and over, adult: Secondary | ICD-10-CM | POA: Diagnosis not present

## 2022-11-22 DIAGNOSIS — Z7985 Long-term (current) use of injectable non-insulin antidiabetic drugs: Secondary | ICD-10-CM | POA: Diagnosis not present

## 2022-11-22 DIAGNOSIS — K635 Polyp of colon: Secondary | ICD-10-CM | POA: Diagnosis not present

## 2022-11-22 DIAGNOSIS — E119 Type 2 diabetes mellitus without complications: Secondary | ICD-10-CM | POA: Diagnosis not present

## 2022-11-22 DIAGNOSIS — Z1211 Encounter for screening for malignant neoplasm of colon: Secondary | ICD-10-CM | POA: Diagnosis not present

## 2022-11-22 DIAGNOSIS — Z7984 Long term (current) use of oral hypoglycemic drugs: Secondary | ICD-10-CM | POA: Diagnosis not present

## 2022-11-22 DIAGNOSIS — E785 Hyperlipidemia, unspecified: Secondary | ICD-10-CM | POA: Diagnosis not present

## 2022-11-22 DIAGNOSIS — I129 Hypertensive chronic kidney disease with stage 1 through stage 4 chronic kidney disease, or unspecified chronic kidney disease: Secondary | ICD-10-CM | POA: Diagnosis not present

## 2022-11-22 DIAGNOSIS — I10 Essential (primary) hypertension: Secondary | ICD-10-CM | POA: Diagnosis not present

## 2022-11-22 DIAGNOSIS — D123 Benign neoplasm of transverse colon: Secondary | ICD-10-CM | POA: Diagnosis not present

## 2022-12-01 ENCOUNTER — Other Ambulatory Visit: Payer: Self-pay | Admitting: Family Medicine

## 2023-01-13 ENCOUNTER — Other Ambulatory Visit: Payer: Self-pay | Admitting: Family Medicine

## 2023-01-13 DIAGNOSIS — N1831 Chronic kidney disease, stage 3a: Secondary | ICD-10-CM

## 2023-01-13 DIAGNOSIS — E1165 Type 2 diabetes mellitus with hyperglycemia: Secondary | ICD-10-CM

## 2023-01-16 DIAGNOSIS — M79676 Pain in unspecified toe(s): Secondary | ICD-10-CM | POA: Diagnosis not present

## 2023-01-16 DIAGNOSIS — B351 Tinea unguium: Secondary | ICD-10-CM | POA: Diagnosis not present

## 2023-01-17 ENCOUNTER — Ambulatory Visit: Payer: BC Managed Care – PPO | Admitting: Family Medicine

## 2023-01-18 ENCOUNTER — Other Ambulatory Visit: Payer: Self-pay | Admitting: Family Medicine

## 2023-01-20 ENCOUNTER — Encounter: Payer: Self-pay | Admitting: Family Medicine

## 2023-01-20 ENCOUNTER — Ambulatory Visit: Payer: BC Managed Care – PPO | Admitting: Family Medicine

## 2023-01-20 VITALS — BP 118/68 | HR 85 | Temp 97.4°F | Ht 72.0 in | Wt 347.5 lb

## 2023-01-20 DIAGNOSIS — I152 Hypertension secondary to endocrine disorders: Secondary | ICD-10-CM

## 2023-01-20 DIAGNOSIS — Z6841 Body Mass Index (BMI) 40.0 and over, adult: Secondary | ICD-10-CM

## 2023-01-20 DIAGNOSIS — E1122 Type 2 diabetes mellitus with diabetic chronic kidney disease: Secondary | ICD-10-CM | POA: Diagnosis not present

## 2023-01-20 DIAGNOSIS — E1165 Type 2 diabetes mellitus with hyperglycemia: Secondary | ICD-10-CM

## 2023-01-20 DIAGNOSIS — E785 Hyperlipidemia, unspecified: Secondary | ICD-10-CM

## 2023-01-20 DIAGNOSIS — N1831 Chronic kidney disease, stage 3a: Secondary | ICD-10-CM | POA: Diagnosis not present

## 2023-01-20 DIAGNOSIS — E1169 Type 2 diabetes mellitus with other specified complication: Secondary | ICD-10-CM | POA: Diagnosis not present

## 2023-01-20 DIAGNOSIS — Z794 Long term (current) use of insulin: Secondary | ICD-10-CM | POA: Diagnosis not present

## 2023-01-20 DIAGNOSIS — E1159 Type 2 diabetes mellitus with other circulatory complications: Secondary | ICD-10-CM

## 2023-01-20 LAB — CMP14+EGFR
ALT: 32 IU/L (ref 0–44)
AST: 17 IU/L (ref 0–40)
Albumin/Globulin Ratio: 1.1 — ABNORMAL LOW (ref 1.2–2.2)
Albumin: 4 g/dL (ref 3.9–4.9)
Alkaline Phosphatase: 131 IU/L — ABNORMAL HIGH (ref 44–121)
BUN/Creatinine Ratio: 20 (ref 10–24)
BUN: 46 mg/dL — ABNORMAL HIGH (ref 8–27)
Bilirubin Total: 0.4 mg/dL (ref 0.0–1.2)
CO2: 21 mmol/L (ref 20–29)
Calcium: 9.3 mg/dL (ref 8.6–10.2)
Chloride: 104 mmol/L (ref 96–106)
Creatinine, Ser: 2.35 mg/dL — ABNORMAL HIGH (ref 0.76–1.27)
Globulin, Total: 3.6 g/dL (ref 1.5–4.5)
Glucose: 93 mg/dL (ref 70–99)
Potassium: 4.5 mmol/L (ref 3.5–5.2)
Sodium: 142 mmol/L (ref 134–144)
Total Protein: 7.6 g/dL (ref 6.0–8.5)
eGFR: 31 mL/min/{1.73_m2} — ABNORMAL LOW (ref >59)

## 2023-01-20 LAB — BAYER DCA HB A1C WAIVED: HB A1C (BAYER DCA - WAIVED): 6.3 % — ABNORMAL HIGH (ref 4.8–5.6)

## 2023-01-20 NOTE — Progress Notes (Signed)
Established Patient Office Visit  Subjective   Patient ID: Jon Moore, male    DOB: Apr 30, 1961  Age: 62 y.o. MRN: [id number]  Chief Complaint  Patient presents with   Medical Management of Chronic Issues   Diabetes    HPI  DM Pt presents for followup evaluation of Type 2 diabetes mellitus. Patient denies foot ulcerations, increased appetite, nausea, paresthesia of the feet, polydipsia, polyuria, visual disturbances, vomiting, and weight loss.  Current diabetic medications include: ozempic 2 mg, semglee 40 units, metformin 500 mg BID, farxiga 10 mg. He was off of ozempic for about 3 weeks prior to colonoscopy in early April Compliant with meds - Yes  Current monitoring regimen: home blood tests - daily Home blood sugar records: fasting range: 80-90s Any episodes of hypoglycemia?   Eye exam current (within one year): yes Podiatry yearly?  No Weight trend: decreasing steadily Current diet: diabetic Current exercise: none  Urine microalbumin UTD? Yes  Is He on ACE inhibitor or angiotensin II receptor blocker?  Yes, olmesartan Is He on statin? Yes crestor  2. HTN Complaint with meds - Yes Current Medications - amlodipine-olmesartan 10-40 mg, coreg 25 mg BID, clonidine 0.1 mg BID, lasix 80 mg,  Checking BP at home ranging: 110s/60-70s Pertinent ROS:  Fatigue - No Visual Disturbances - No Chest pain - No Dyspnea - No Palpitations - No  3. CKD On ARB, farxiga, and karendia.   Past Medical History:  Diagnosis Date   Diabetes mellitus    Frozen shoulder syndrome 2013   Right shoulder.  Has had PT.   Hypercholesterolemia    Hypertension       ROS As per HPI.    Objective:     BP 118/68   Pulse 85   Temp (!) 97.4 F (36.3 C) (Temporal)   Ht 6' (1.829 m)   Wt (!) 347 lb 8 oz (157.6 kg)   SpO2 96%   BMI 47.13 kg/m  Wt Readings from Last 3 Encounters:  01/20/23 (!) 347 lb 8 oz (157.6 kg)  10/17/22 (!) 356 lb 8 oz (161.7 kg)  07/17/22 (!) 357 lb 6 oz  (162.1 kg)     Physical Exam Vitals and nursing note reviewed.  Constitutional:      General: He is not in acute distress.    Appearance: He is obese. He is not ill-appearing, toxic-appearing or diaphoretic.  HENT:     Head: Normocephalic and atraumatic.     Right Ear: Tympanic membrane, ear canal and external ear normal.     Left Ear: Tympanic membrane, ear canal and external ear normal.  Neck:     Thyroid: No thyroid mass, thyromegaly or thyroid tenderness.     Vascular: No carotid bruit.  Cardiovascular:     Rate and Rhythm: Normal rate and regular rhythm.     Heart sounds: Normal heart sounds. No murmur heard. Pulmonary:     Effort: Pulmonary effort is normal. No respiratory distress.     Breath sounds: Normal breath sounds.  Abdominal:     General: Bowel sounds are normal. There is no distension.     Palpations: Abdomen is soft.     Tenderness: There is no abdominal tenderness. There is no guarding or rebound.  Musculoskeletal:     Right lower leg: No edema.     Left lower leg: No edema.  Skin:    General: Skin is warm and dry.  Neurological:     General: No focal deficit present.  Mental Status: He is alert and oriented to person, place, and time.     Motor: No weakness.     Gait: Gait normal.  Psychiatric:        Mood and Affect: Mood normal.        Behavior: Behavior normal.        Thought Content: Thought content normal.        Judgment: Judgment normal.      No results found for any visits on 01/20/23.  Last CBC Lab Results  Component Value Date   WBC 8.2 10/17/2022   HGB 14.3 10/17/2022   HCT 42.0 10/17/2022   MCV 79 10/17/2022   MCH 27.0 10/17/2022   RDW 14.0 10/17/2022   PLT 281 10/17/2022   Last metabolic panel Lab Results  Component Value Date   GLUCOSE 79 10/17/2022   NA 141 10/17/2022   K 4.5 10/17/2022   CL 102 10/17/2022   CO2 24 10/17/2022   BUN 37 (H) 10/17/2022   CREATININE 1.90 (H) 10/17/2022   EGFR 39 (L) 10/17/2022    CALCIUM 9.6 10/17/2022   PHOS 3.2 05/30/2021   PROT 7.7 10/17/2022   ALBUMIN 4.1 10/17/2022   LABGLOB 3.6 10/17/2022   AGRATIO 1.1 (L) 10/17/2022   BILITOT 0.3 10/17/2022   ALKPHOS 131 (H) 10/17/2022   AST 21 10/17/2022   ALT 33 10/17/2022   Last lipids Lab Results  Component Value Date   CHOL 135 04/12/2022   HDL 28 (L) 04/12/2022   LDLCALC 69 04/12/2022   TRIG 229 (H) 04/12/2022   CHOLHDL 4.8 04/12/2022   Last hemoglobin A1c Lab Results  Component Value Date   HGBA1C 7.1 (H) 10/17/2022      The 10-year ASCVD risk score (Arnett DK, et al., 2019) is: 23.7%    Assessment & Plan:   Kharee was seen today for medical management of chronic issues and diabetes.  Diagnoses and all orders for this visit:  Type 2 diabetes mellitus with hyperglycemia, with long-term current use of insulin (HCC) A1c 6.3 today, at goal of <7. Medication changes today: none, continue current regimen. He is on an ACE/ARB and statin. Eye exam: UTD. Foot exam: UTD. Urine micro: UTD. Diet and exercise.  -     Bayer DCA Hb A1c Waived  Hypertension associated with diabetes (HCC) Well controlled on current regimen.   Hyperlipidemia associated with type 2 diabetes mellitus (HCC) Last LDL at goal. Continue statin.   Stage 3a chronic kidney disease (HCC) On ARB, farxiga, karendia. Avoid NSAIDs. Labs pending.  -     CMP14+EGFR  Morbid obesity (HCC) Down an additional 9 lbs. Diet and exercise.   Return in about 3 months (around 04/22/2023) for chronic follow up.  The patient indicates understanding of these issues and agrees with the plan.    Gabriel Earing, FNP

## 2023-01-21 ENCOUNTER — Encounter: Payer: Self-pay | Admitting: Family Medicine

## 2023-01-23 ENCOUNTER — Other Ambulatory Visit: Payer: Self-pay

## 2023-01-23 ENCOUNTER — Other Ambulatory Visit: Payer: Self-pay | Admitting: Family Medicine

## 2023-01-23 ENCOUNTER — Telehealth: Payer: Self-pay

## 2023-01-23 DIAGNOSIS — N1832 Chronic kidney disease, stage 3b: Secondary | ICD-10-CM

## 2023-01-23 DIAGNOSIS — N1831 Chronic kidney disease, stage 3a: Secondary | ICD-10-CM

## 2023-01-23 NOTE — Telephone Encounter (Signed)
Yes, I would like for him to continue meds. I've discussed this with [name] as well and she agrees with continuing all meds She is working on responding back to his [employer].

## 2023-01-23 NOTE — Telephone Encounter (Signed)
Pt wanted to know if you still want pt to continue taking the following meds?  Kerendia Ozempic Farxiga  Pls advice?

## 2023-01-23 NOTE — Progress Notes (Signed)
Disregard order, already been placed

## 2023-01-24 DIAGNOSIS — E1122 Type 2 diabetes mellitus with diabetic chronic kidney disease: Secondary | ICD-10-CM | POA: Diagnosis not present

## 2023-01-24 DIAGNOSIS — Z794 Long term (current) use of insulin: Secondary | ICD-10-CM | POA: Diagnosis not present

## 2023-01-24 DIAGNOSIS — Z13820 Encounter for screening for osteoporosis: Secondary | ICD-10-CM | POA: Diagnosis not present

## 2023-01-24 DIAGNOSIS — N183 Chronic kidney disease, stage 3 unspecified: Secondary | ICD-10-CM | POA: Diagnosis not present

## 2023-01-25 ENCOUNTER — Other Ambulatory Visit: Payer: Self-pay | Admitting: Family Medicine

## 2023-01-30 ENCOUNTER — Other Ambulatory Visit: Payer: BC Managed Care – PPO

## 2023-01-30 ENCOUNTER — Other Ambulatory Visit: Payer: Self-pay

## 2023-01-30 DIAGNOSIS — N1831 Chronic kidney disease, stage 3a: Secondary | ICD-10-CM

## 2023-01-30 DIAGNOSIS — Z794 Long term (current) use of insulin: Secondary | ICD-10-CM | POA: Diagnosis not present

## 2023-01-30 DIAGNOSIS — E1165 Type 2 diabetes mellitus with hyperglycemia: Secondary | ICD-10-CM | POA: Diagnosis not present

## 2023-01-30 DIAGNOSIS — E1159 Type 2 diabetes mellitus with other circulatory complications: Secondary | ICD-10-CM | POA: Diagnosis not present

## 2023-01-30 DIAGNOSIS — I152 Hypertension secondary to endocrine disorders: Secondary | ICD-10-CM

## 2023-01-30 LAB — BASIC METABOLIC PANEL
BUN/Creatinine Ratio: 16 (ref 10–24)
BUN: 28 mg/dL — ABNORMAL HIGH (ref 8–27)
CO2: 22 mmol/L (ref 20–29)
Calcium: 9.5 mg/dL (ref 8.6–10.2)
Chloride: 105 mmol/L (ref 96–106)
Creatinine, Ser: 1.72 mg/dL — ABNORMAL HIGH (ref 0.76–1.27)
Glucose: 69 mg/dL — ABNORMAL LOW (ref 70–99)
Potassium: 4.4 mmol/L (ref 3.5–5.2)
Sodium: 138 mmol/L (ref 134–144)
eGFR: 44 mL/min/{1.73_m2} — ABNORMAL LOW (ref >59)

## 2023-02-09 ENCOUNTER — Other Ambulatory Visit: Payer: Self-pay | Admitting: Family Medicine

## 2023-02-11 ENCOUNTER — Other Ambulatory Visit: Payer: Self-pay | Admitting: Nephrology

## 2023-02-11 DIAGNOSIS — R809 Proteinuria, unspecified: Secondary | ICD-10-CM | POA: Diagnosis not present

## 2023-02-11 DIAGNOSIS — E1129 Type 2 diabetes mellitus with other diabetic kidney complication: Secondary | ICD-10-CM | POA: Diagnosis not present

## 2023-02-11 DIAGNOSIS — E559 Vitamin D deficiency, unspecified: Secondary | ICD-10-CM | POA: Diagnosis not present

## 2023-02-11 DIAGNOSIS — N189 Chronic kidney disease, unspecified: Secondary | ICD-10-CM

## 2023-02-11 DIAGNOSIS — N1832 Chronic kidney disease, stage 3b: Secondary | ICD-10-CM | POA: Diagnosis not present

## 2023-02-12 ENCOUNTER — Other Ambulatory Visit: Payer: Self-pay | Admitting: Family Medicine

## 2023-02-12 DIAGNOSIS — E1165 Type 2 diabetes mellitus with hyperglycemia: Secondary | ICD-10-CM

## 2023-03-01 ENCOUNTER — Other Ambulatory Visit: Payer: Self-pay | Admitting: Family Medicine

## 2023-03-24 ENCOUNTER — Telehealth: Payer: Self-pay

## 2023-03-24 ENCOUNTER — Other Ambulatory Visit: Payer: Self-pay | Admitting: Family Medicine

## 2023-03-24 NOTE — Telephone Encounter (Signed)
Ariv Kilian (Key: [id number]) PA Case ID #: [id number] Rx #: [id number] Need Help? Call us at [phone] Status sent iconSent to Plan today Drug Rosuvastatin Calcium 5MG  tablets ePA cloud Psychologist, educational Electronic PA Form [phone] NCPDP) Original Claim Info [phone]

## 2023-03-25 ENCOUNTER — Encounter: Payer: Self-pay | Admitting: Family Medicine

## 2023-03-28 ENCOUNTER — Telehealth: Payer: Self-pay | Admitting: Family Medicine

## 2023-03-28 DIAGNOSIS — E1169 Type 2 diabetes mellitus with other specified complication: Secondary | ICD-10-CM

## 2023-03-28 NOTE — Telephone Encounter (Signed)
Pharmacy Patient Advocate Encounter  Received notification from  [employer]  that Prior Authorization for Rosuvastatin Calcium 5MG  tablets has been DENIED. Please advise how you'd like to proceed. Full denial letter will be uploaded to the media tab. See denial reason below.   PA #/Case ID/Reference #: [id number] Key: [id number]   Denial Reason: Must try and fail Atorvastatin 40mg  or 80mg 

## 2023-03-31 DIAGNOSIS — R81 Glycosuria: Secondary | ICD-10-CM | POA: Insufficient documentation

## 2023-03-31 MED ORDER — ATORVASTATIN CALCIUM 40 MG PO TABS
40.0000 mg | ORAL_TABLET | Freq: Every day | ORAL | 3 refills | Status: DC
Start: 2023-03-31 — End: 2024-03-22

## 2023-03-31 NOTE — Telephone Encounter (Signed)
Pt aware new med sent in

## 2023-03-31 NOTE — Telephone Encounter (Signed)
Insurance will not cover crestor. Atorvastatin 40 mg sent instead.

## 2023-04-01 ENCOUNTER — Other Ambulatory Visit: Payer: Self-pay | Admitting: Pharmacist

## 2023-04-01 DIAGNOSIS — E1165 Type 2 diabetes mellitus with hyperglycemia: Secondary | ICD-10-CM

## 2023-04-01 MED ORDER — INSULIN PEN NEEDLE 29G X 12.7MM MISC: 3 refills | Status: AC

## 2023-04-01 MED ORDER — BASAGLAR KWIKPEN 100 UNIT/ML ~~LOC~~ SOPN: 40.0000 [IU] | PEN_INJECTOR | Freq: Every day | SUBCUTANEOUS | 4 refills | Status: DC

## 2023-04-01 NOTE — Progress Notes (Signed)
   04/01/2023 Name: Jon Moore MRN: [id number] DOB: 1961/01/09  Chief Complaint  Patient presents with   Diabetes   Patient's insurance has required transition to [employer] (for long-acting basal insulin).  Patient is currently taking 40 units of insulin glargine (Semglee) and will continue this same daily dose with the [employer] (insulin glargine).  Prescription sent to [facility] for Basaglar and pen needles.  Will notify patient via my chart message  Lab Results  Component Value Date   HGBA1C 6.3 (H) 01/20/2023      Kieth Brightly, PharmD, BCACP Clinical Pharmacist, [facility]

## 2023-04-01 NOTE — Telephone Encounter (Signed)
Patient needs to talk to Jon Moore about his insulin. Will be out the end of the month. Please call.

## 2023-04-02 ENCOUNTER — Other Ambulatory Visit (HOSPITAL_COMMUNITY): Payer: Self-pay | Admitting: Nephrology

## 2023-04-02 DIAGNOSIS — R8290 Unspecified abnormal findings in urine: Secondary | ICD-10-CM

## 2023-04-02 DIAGNOSIS — N1832 Chronic kidney disease, stage 3b: Secondary | ICD-10-CM

## 2023-04-03 ENCOUNTER — Other Ambulatory Visit: Payer: Self-pay | Admitting: Family Medicine

## 2023-04-03 ENCOUNTER — Telehealth: Payer: Self-pay | Admitting: Family Medicine

## 2023-04-03 DIAGNOSIS — E1165 Type 2 diabetes mellitus with hyperglycemia: Secondary | ICD-10-CM

## 2023-04-03 MED ORDER — BASAGLAR KWIKPEN 100 UNIT/ML ~~LOC~~ SOPN
40.0000 [IU] | PEN_INJECTOR | Freq: Every day | SUBCUTANEOUS | 4 refills | Status: DC
Start: 2023-04-03 — End: 2023-04-08

## 2023-04-03 NOTE — Telephone Encounter (Signed)
Please advise and if appropriate send in new script

## 2023-04-03 NOTE — Telephone Encounter (Signed)
Pt aware new rx sent in. 

## 2023-04-03 NOTE — Telephone Encounter (Signed)
Rx sent for 2 month supply

## 2023-04-07 ENCOUNTER — Encounter: Payer: Self-pay | Admitting: Family Medicine

## 2023-04-07 ENCOUNTER — Telehealth: Payer: Self-pay | Admitting: Family Medicine

## 2023-04-07 ENCOUNTER — Ambulatory Visit (HOSPITAL_COMMUNITY): Payer: BC Managed Care – PPO

## 2023-04-07 DIAGNOSIS — E1165 Type 2 diabetes mellitus with hyperglycemia: Secondary | ICD-10-CM

## 2023-04-07 NOTE — Telephone Encounter (Signed)
Pt says rx is still not right. Please call pt back to clarify.

## 2023-04-07 NOTE — Telephone Encounter (Signed)
[name] can you take a look at this-it looks like we had just changed him due to his new insurance not covering the Lantus. Please advise.

## 2023-04-07 NOTE — Telephone Encounter (Signed)
Jon Moore called from patients [facility] plan stating that she has spoken with pt and pt is interested in being put back on Lantus, which is what he used to take. Says Rx would require PA to be filled out or even a tier exception.   Can contact [name] for questions. Leave detailed message with good contact info if Jon Moore is unable to take the call. [phone]

## 2023-04-07 NOTE — Telephone Encounter (Signed)
Pt aware that the boxes can't be split up so he will end up having extra insulin and not having to get the refills every 30 days. He may pay a little more up front it won't $60 every month as he won't need it filled every month. Pt voiced understanding.

## 2023-04-08 ENCOUNTER — Telehealth: Payer: Self-pay | Admitting: Family Medicine

## 2023-04-08 DIAGNOSIS — Z794 Long term (current) use of insulin: Secondary | ICD-10-CM

## 2023-04-08 MED ORDER — BASAGLAR KWIKPEN 100 UNIT/ML ~~LOC~~ SOPN
PEN_INJECTOR | SUBCUTANEOUS | 4 refills | Status: DC
Start: 2023-04-08 — End: 2023-04-09

## 2023-04-08 NOTE — Telephone Encounter (Signed)
I spoke with Jon Moore and [name] and ok to write his current Basaglar Sig: Inject up to max dose of 50 units daily. New rx written and sent to pharmacy and pt is aware.

## 2023-04-09 MED ORDER — LANTUS SOLOSTAR 100 UNIT/ML ~~LOC~~ SOPN: PEN_INJECTOR | SUBCUTANEOUS | 4 refills | Status: DC

## 2023-04-09 NOTE — Telephone Encounter (Signed)
Pt aware.

## 2023-04-09 NOTE — Telephone Encounter (Signed)
Lantus RX sent.

## 2023-04-10 ENCOUNTER — Other Ambulatory Visit: Payer: Self-pay | Admitting: Family Medicine

## 2023-04-10 DIAGNOSIS — E1165 Type 2 diabetes mellitus with hyperglycemia: Secondary | ICD-10-CM

## 2023-04-10 MED ORDER — BASAGLAR KWIKPEN 100 UNIT/ML ~~LOC~~ SOPN
PEN_INJECTOR | SUBCUTANEOUS | 4 refills | Status: DC
Start: 2023-04-10 — End: 2023-08-25

## 2023-04-16 ENCOUNTER — Ambulatory Visit (HOSPITAL_COMMUNITY)
Admission: RE | Admit: 2023-04-16 | Discharge: 2023-04-16 | Disposition: A | Discharge status: Home / Self Care | Payer: BC Managed Care – PPO | Source: Ambulatory Visit | Attending: Nephrology | Admitting: Nephrology

## 2023-04-16 DIAGNOSIS — N1832 Chronic kidney disease, stage 3b: Secondary | ICD-10-CM | POA: Diagnosis not present

## 2023-04-16 DIAGNOSIS — N183 Chronic kidney disease, stage 3 unspecified: Secondary | ICD-10-CM | POA: Diagnosis not present

## 2023-04-16 DIAGNOSIS — R8290 Unspecified abnormal findings in urine: Secondary | ICD-10-CM | POA: Insufficient documentation

## 2023-04-23 ENCOUNTER — Other Ambulatory Visit: Payer: Self-pay | Admitting: Family Medicine

## 2023-04-24 ENCOUNTER — Other Ambulatory Visit: Payer: Self-pay | Admitting: Family Medicine

## 2023-04-24 DIAGNOSIS — E1165 Type 2 diabetes mellitus with hyperglycemia: Secondary | ICD-10-CM

## 2023-04-24 DIAGNOSIS — N1831 Chronic kidney disease, stage 3a: Secondary | ICD-10-CM

## 2023-04-25 ENCOUNTER — Ambulatory Visit (INDEPENDENT_AMBULATORY_CARE_PROVIDER_SITE_OTHER): Payer: 59 | Admitting: Family Medicine

## 2023-04-25 ENCOUNTER — Encounter: Payer: Self-pay | Admitting: Family Medicine

## 2023-04-25 VITALS — BP 123/76 | HR 83 | Temp 98.4°F | Ht 72.0 in | Wt 348.1 lb

## 2023-04-25 DIAGNOSIS — I152 Hypertension secondary to endocrine disorders: Secondary | ICD-10-CM

## 2023-04-25 DIAGNOSIS — R809 Proteinuria, unspecified: Secondary | ICD-10-CM

## 2023-04-25 DIAGNOSIS — E1169 Type 2 diabetes mellitus with other specified complication: Secondary | ICD-10-CM

## 2023-04-25 DIAGNOSIS — Z7985 Long-term (current) use of injectable non-insulin antidiabetic drugs: Secondary | ICD-10-CM | POA: Diagnosis not present

## 2023-04-25 DIAGNOSIS — E1159 Type 2 diabetes mellitus with other circulatory complications: Secondary | ICD-10-CM | POA: Diagnosis not present

## 2023-04-25 DIAGNOSIS — E1129 Type 2 diabetes mellitus with other diabetic kidney complication: Secondary | ICD-10-CM | POA: Insufficient documentation

## 2023-04-25 DIAGNOSIS — Z794 Long term (current) use of insulin: Secondary | ICD-10-CM

## 2023-04-25 DIAGNOSIS — E1165 Type 2 diabetes mellitus with hyperglycemia: Secondary | ICD-10-CM | POA: Diagnosis not present

## 2023-04-25 DIAGNOSIS — Z7984 Long term (current) use of oral hypoglycemic drugs: Secondary | ICD-10-CM | POA: Diagnosis not present

## 2023-04-25 DIAGNOSIS — N1832 Chronic kidney disease, stage 3b: Secondary | ICD-10-CM | POA: Insufficient documentation

## 2023-04-25 DIAGNOSIS — E785 Hyperlipidemia, unspecified: Secondary | ICD-10-CM

## 2023-04-25 LAB — CMP14+EGFR
ALT: 77 IU/L — ABNORMAL HIGH (ref 0–44)
AST: 35 IU/L (ref 0–40)
Albumin: 3.8 g/dL — ABNORMAL LOW (ref 3.9–4.9)
Alkaline Phosphatase: 154 IU/L — ABNORMAL HIGH (ref 44–121)
BUN/Creatinine Ratio: 14 (ref 10–24)
BUN: 27 mg/dL (ref 8–27)
Bilirubin Total: 0.4 mg/dL (ref 0.0–1.2)
CO2: 22 mmol/L (ref 20–29)
Calcium: 9.2 mg/dL (ref 8.6–10.2)
Chloride: 106 mmol/L (ref 96–106)
Creatinine, Ser: 1.93 mg/dL — ABNORMAL HIGH (ref 0.76–1.27)
Globulin, Total: 3.7 g/dL (ref 1.5–4.5)
Glucose: 73 mg/dL (ref 70–99)
Potassium: 4.2 mmol/L (ref 3.5–5.2)
Sodium: 144 mmol/L (ref 134–144)
Total Protein: 7.5 g/dL (ref 6.0–8.5)
eGFR: 39 mL/min/{1.73_m2} — ABNORMAL LOW (ref >59)

## 2023-04-25 LAB — BAYER DCA HB A1C WAIVED: HB A1C (BAYER DCA - WAIVED): 6.2 % — ABNORMAL HIGH (ref 4.8–5.6)

## 2023-04-25 LAB — LIPID PANEL
Chol/HDL Ratio: 3.5 ratio (ref 0.0–5.0)
Cholesterol, Total: 91 mg/dL — ABNORMAL LOW (ref 100–199)
HDL: 26 mg/dL — ABNORMAL LOW (ref >39)
LDL Chol Calc (NIH): 44 mg/dL (ref 0–99)
Triglycerides: 113 mg/dL (ref 0–149)
VLDL Cholesterol Cal: 21 mg/dL (ref 5–40)

## 2023-04-25 NOTE — Progress Notes (Signed)
Established Patient Office Visit  Subjective   Patient ID: Jon Moore, male    DOB: 1961-03-27  Age: 62 y.o. MRN: [id number]  Chief Complaint  Patient presents with   Medical Management of Chronic Issues   Diabetes   Hyperlipidemia   Chronic Kidney Disease   Hypertension    HPI  DM Pt presents for followup evaluation of Type 2 diabetes mellitus. Patient denies foot ulcerations, increased appetite, nausea, paresthesia of the feet, polydipsia, polyuria, visual disturbances, vomiting, and weight loss.  Current diabetic medications include: ozempic 2 mg, basaglar 40 units, farxiga 10 mg. Nephrology has had him hold metformin Compliant with meds - Yes  Current monitoring regimen: home blood tests - daily Home blood sugar records: fasting range: 80s Any episodes of hypoglycemia?   Eye exam current (within one year): yes Podiatry yearly?  No Weight trend: decreasing steadily Current diet: diabetic Current exercise: none  Urine microalbumin UTD? Yes  Is He on ACE inhibitor or angiotensin II receptor blocker?  Yes, olmesartan Is He on statin? Yes crestor  2. HTN Complaint with meds - Yes Current Medications - amlodipine-olmesartan 10-40 mg, coreg 25 mg BID, clonidine 0.1 mg BID, lasix 80 mg,  Checking BP at home ranging: 120s/70s Pertinent ROS:  Fatigue - No Visual Disturbances - No Chest pain - No Dyspnea - No Palpitations - No  3. HLD On statin. Ok diet. No exercise.   4. CKD On ARB, farxiga,. Currently not taking karendia- on hold by nephrology  Past Medical History:  Diagnosis Date   Diabetes mellitus    Frozen shoulder syndrome 2013   Right shoulder.  Has had PT.   Hypercholesterolemia    Hypertension       ROS As per HPI.    Objective:     BP 123/76   Pulse 83   Temp 98.4 F (36.9 C) (Temporal)   Ht 6' (1.829 m)   Wt (!) 348 lb 2 oz (157.9 kg)   SpO2 95%   BMI 47.21 kg/m  Wt Readings from Last 3 Encounters:  04/25/23 (!) 348 lb 2 oz  (157.9 kg)  01/20/23 (!) 347 lb 8 oz (157.6 kg)  10/17/22 (!) 356 lb 8 oz (161.7 kg)     Physical Exam Vitals and nursing note reviewed.  Constitutional:      General: He is not in acute distress.    Appearance: He is obese. He is not ill-appearing, toxic-appearing or diaphoretic.  HENT:     Head: Normocephalic and atraumatic.     Right Ear: Tympanic membrane, ear canal and external ear normal.     Left Ear: Tympanic membrane, ear canal and external ear normal.  Neck:     Thyroid: No thyroid mass, thyromegaly or thyroid tenderness.     Vascular: No carotid bruit.  Cardiovascular:     Rate and Rhythm: Normal rate and regular rhythm.     Heart sounds: Normal heart sounds. No murmur heard. Pulmonary:     Effort: Pulmonary effort is normal. No respiratory distress.     Breath sounds: Normal breath sounds.  Abdominal:     General: Bowel sounds are normal. There is no distension.     Palpations: Abdomen is soft.     Tenderness: There is no abdominal tenderness. There is no guarding or rebound.  Musculoskeletal:     Right lower leg: No edema.     Left lower leg: No edema.  Skin:    General: Skin is warm and dry.  Neurological:     General: No focal deficit present.     Mental Status: He is alert and oriented to person, place, and time.     Motor: No weakness.     Gait: Gait normal.  Psychiatric:        Mood and Affect: Mood normal.        Behavior: Behavior normal.        Thought Content: Thought content normal.        Judgment: Judgment normal.      No results found for any visits on 04/25/23.  Last CBC Lab Results  Component Value Date   WBC 8.2 10/17/2022   HGB 14.3 10/17/2022   HCT 42.0 10/17/2022   MCV 79 10/17/2022   MCH 27.0 10/17/2022   RDW 14.0 10/17/2022   PLT 281 10/17/2022   Last metabolic panel Lab Results  Component Value Date   GLUCOSE 69 (L) 01/30/2023   NA 138 01/30/2023   K 4.4 01/30/2023   CL 105 01/30/2023   CO2 22 01/30/2023   BUN 28  (H) 01/30/2023   CREATININE 1.72 (H) 01/30/2023   EGFR 44 (L) 01/30/2023   CALCIUM 9.5 01/30/2023   PHOS 3.2 05/30/2021   PROT 7.6 01/20/2023   ALBUMIN 4.0 01/20/2023   LABGLOB 3.6 01/20/2023   AGRATIO 1.1 (L) 01/20/2023   BILITOT 0.4 01/20/2023   ALKPHOS 131 (H) 01/20/2023   AST 17 01/20/2023   ALT 32 01/20/2023   Last lipids Lab Results  Component Value Date   CHOL 135 04/12/2022   HDL 28 (L) 04/12/2022   LDLCALC 69 04/12/2022   TRIG 229 (H) 04/12/2022   CHOLHDL 4.8 04/12/2022   Last hemoglobin A1c Lab Results  Component Value Date   HGBA1C 6.3 (H) 01/20/2023      The 10-year ASCVD risk score (Arnett DK, et al., 2019) is: 25.4%    Assessment & Plan:   Domenik was seen today for medical management of chronic issues, diabetes, hyperlipidemia, chronic kidney disease and hypertension.  Diagnoses and all orders for this visit:  Type 2 diabetes mellitus with hyperglycemia, with long-term current use of insulin (HCC) A1c 62 today, at goal of <7. Medication changes today: none. Continue basaglar, farxiga, ozempic. He is on an ACE/ARB and statin. Eye exam: reminded to schedule. Foot exam: UTD. Urine micro: UTD. Diet and exercise.  -     Bayer DCA Hb A1c Waived  Long term current use of oral hypoglycemic drug Long-term current use of injectable noninsulin antidiabetic medication  Hypertension associated with diabetes (HCC) Well controlled on current regimen.   Hyperlipidemia associated with type 2 diabetes mellitus (HCC) On statin. Fasting panel pending.  -     Lipid panel  Stage 3b chronic kidney disease (HCC) Managed by nephrology. On farxiga, ARB. Leonides Grills on hold.  -     CMP14+EGFR  Morbid obesity (HCC) Diet and exercise.   Microalbuminuria due to type 2 diabetes mellitus (HCC) On farxiga.   Return in about 6 months (around 10/23/2023) for chronic follow up.  The patient indicates understanding of these issues and agrees with the plan.    Gabriel Earing, FNP

## 2023-04-26 ENCOUNTER — Telehealth: Payer: Self-pay | Admitting: Pharmacist

## 2023-04-26 NOTE — Telephone Encounter (Signed)
  Patient now CKD 3b Restarting/continue on Farxiga PA completed on covermymeds A1c has improved! Chauncey Mann on hold per nephro Metformin discontinued

## 2023-04-28 ENCOUNTER — Other Ambulatory Visit: Payer: Self-pay | Admitting: Family Medicine

## 2023-04-28 ENCOUNTER — Other Ambulatory Visit: Payer: Self-pay | Admitting: *Deleted

## 2023-04-28 DIAGNOSIS — R748 Abnormal levels of other serum enzymes: Secondary | ICD-10-CM

## 2023-04-30 ENCOUNTER — Encounter: Payer: Self-pay | Admitting: Family Medicine

## 2023-04-30 ENCOUNTER — Other Ambulatory Visit: Payer: Self-pay | Admitting: Family Medicine

## 2023-04-30 NOTE — Telephone Encounter (Signed)
Appeal submitted via covermymeds 04/30/23 Will follow

## 2023-05-01 LAB — ALKALINE PHOSPHATASE, ISOENZYMES
Alkaline Phosphatase: 152 IU/L — ABNORMAL HIGH (ref 44–121)
BONE FRACTION: 36 % (ref 12–68)
INTESTINAL FRAC.: 13 % (ref 0–18)
LIVER FRACTION: 51 % (ref 13–88)

## 2023-05-01 LAB — SPECIMEN STATUS REPORT

## 2023-05-02 ENCOUNTER — Other Ambulatory Visit: Payer: Self-pay | Admitting: Family Medicine

## 2023-05-20 ENCOUNTER — Other Ambulatory Visit (HOSPITAL_COMMUNITY): Payer: Self-pay

## 2023-05-20 NOTE — Telephone Encounter (Addendum)
Pharmacy Patient Advocate Encounter  Received notification from  [facility]  that Prior Authorization for Marcelline Deist has been APPROVED from 05/01/23 to 04/30/24  last filled 05/04/23   PA #/Case ID/Reference #: [id number] EF

## 2023-05-26 ENCOUNTER — Other Ambulatory Visit: Payer: 59

## 2023-05-26 DIAGNOSIS — R748 Abnormal levels of other serum enzymes: Secondary | ICD-10-CM

## 2023-05-26 NOTE — Progress Notes (Unsigned)
ccccccc

## 2023-05-27 LAB — CBC WITH DIFFERENTIAL/PLATELET
Basophils Absolute: 0.1 10*3/uL (ref 0.0–0.2)
Basos: 1 %
EOS (ABSOLUTE): 0.3 10*3/uL (ref 0.0–0.4)
Eos: 3 %
Hematocrit: 40.2 % (ref 37.5–51.0)
Hemoglobin: 13.3 g/dL (ref 13.0–17.7)
Immature Grans (Abs): 0 10*3/uL (ref 0.0–0.1)
Immature Granulocytes: 0 %
Lymphocytes Absolute: 3.1 10*3/uL (ref 0.7–3.1)
Lymphs: 38 %
MCH: 26.7 pg (ref 26.6–33.0)
MCHC: 33.1 g/dL (ref 31.5–35.7)
MCV: 81 fL (ref 79–97)
Monocytes Absolute: 0.8 10*3/uL (ref 0.1–0.9)
Monocytes: 10 %
Neutrophils Absolute: 3.9 10*3/uL (ref 1.4–7.0)
Neutrophils: 48 %
Platelets: 325 10*3/uL (ref 150–450)
RBC: 4.98 x10E6/uL (ref 4.14–5.80)
RDW: 14 % (ref 11.6–15.4)
WBC: 8.3 10*3/uL (ref 3.4–10.8)

## 2023-05-27 LAB — HEPATIC FUNCTION PANEL
ALT: 47 [IU]/L — ABNORMAL HIGH (ref 0–44)
AST: 26 [IU]/L (ref 0–40)
Albumin: 3.9 g/dL (ref 3.9–4.9)
Alkaline Phosphatase: 136 [IU]/L — ABNORMAL HIGH (ref 44–121)
Bilirubin Total: 0.4 mg/dL (ref 0.0–1.2)
Bilirubin, Direct: 0.12 mg/dL (ref 0.00–0.40)
Total Protein: 7.7 g/dL (ref 6.0–8.5)

## 2023-05-28 ENCOUNTER — Other Ambulatory Visit: Payer: Self-pay | Admitting: Family Medicine

## 2023-05-28 DIAGNOSIS — E1165 Type 2 diabetes mellitus with hyperglycemia: Secondary | ICD-10-CM

## 2023-06-01 ENCOUNTER — Other Ambulatory Visit: Payer: Self-pay | Admitting: Family Medicine

## 2023-06-06 ENCOUNTER — Telehealth: Payer: Self-pay

## 2023-06-06 NOTE — Telephone Encounter (Signed)
Jon Moore (Key: [id number]) Rx #: [id number] Ozempic (2 MG/DOSE) 8MG [name] pen-injectors Form Ambulance person PA Form (2017 NCPDP) Created 8 days ago Sent to Plan 4 minutes ago Plan Response 4 minutes ago Submit Clinical Questions less than a minute ago Determination Wait for Determination Please wait for [employer] NCPDP 2017 to return a determination.

## 2023-06-09 ENCOUNTER — Other Ambulatory Visit (HOSPITAL_COMMUNITY): Payer: Self-pay

## 2023-06-09 NOTE — Telephone Encounter (Signed)
Pharmacy Patient Advocate Encounter  Received notification from [facility] that Prior Authorization for [facility] has been APPROVED from 10.19.24 to 10.19.25. Ran test claim, Copay is $74.99. This test claim was processed through [facility]- copay amounts may vary at other pharmacies due to pharmacy/plan contracts, or as the patient moves through the different stages of their insurance plan.   PA #/Case ID/Reference #: Key: [id number]

## 2023-06-12 ENCOUNTER — Other Ambulatory Visit: Payer: Self-pay | Admitting: Family Medicine

## 2023-07-07 DIAGNOSIS — E1122 Type 2 diabetes mellitus with diabetic chronic kidney disease: Secondary | ICD-10-CM | POA: Insufficient documentation

## 2023-07-07 DIAGNOSIS — I129 Hypertensive chronic kidney disease with stage 1 through stage 4 chronic kidney disease, or unspecified chronic kidney disease: Secondary | ICD-10-CM | POA: Insufficient documentation

## 2023-07-09 ENCOUNTER — Ambulatory Visit: Payer: BC Managed Care – PPO | Admitting: Orthopedic Surgery

## 2023-07-09 ENCOUNTER — Other Ambulatory Visit: Payer: Self-pay

## 2023-07-09 ENCOUNTER — Other Ambulatory Visit: Payer: Self-pay | Admitting: Orthopedic Surgery

## 2023-07-09 ENCOUNTER — Other Ambulatory Visit (INDEPENDENT_AMBULATORY_CARE_PROVIDER_SITE_OTHER): Payer: BC Managed Care – PPO

## 2023-07-09 ENCOUNTER — Encounter: Payer: Self-pay | Admitting: Orthopedic Surgery

## 2023-07-09 VITALS — BP 118/71 | HR 76 | Ht 72.0 in | Wt 341.0 lb

## 2023-07-09 DIAGNOSIS — M170 Bilateral primary osteoarthritis of knee: Secondary | ICD-10-CM

## 2023-07-09 DIAGNOSIS — M25561 Pain in right knee: Secondary | ICD-10-CM | POA: Diagnosis not present

## 2023-07-09 DIAGNOSIS — G8929 Other chronic pain: Secondary | ICD-10-CM

## 2023-07-09 NOTE — Patient Instructions (Signed)

## 2023-07-09 NOTE — Progress Notes (Addendum)
New Patient Visit  Assessment: Jon Moore is a 62 y.o. male with the following: 1. Primary osteoarthritis of both knees  Plan: Danya Oneill has pain in both knees, right is much worse than the left currently.  Radiographs demonstrate severe degenerative changes of the left knee, with moderate to severe degenerative changes of the right knee.  He is complaining of pain in the right knee only today.  He is interested in a steroid injection of the right knee.  He is using a cane to assist with ambulation.  He has stage III kidney disease, and is very limited in regards to his medications.  I advised him to try some topical treatments, including ice.  Will continue to use his cane.  Unfortunately, he is not a candidate for total knee arthroplasty currently, as his BMI is 46.  He states he has lost 50 pounds in the past year, taking the Ozempic.  Right knee injection was completed in clinic today without issues.  If he has issues with the left knee, I am happy to provide him with another injection.  Otherwise, he will follow-up as needed.  Procedure note injection Right knee joint   Verbal consent was obtained to inject the right knee joint  Timeout was completed to confirm the site of injection.  The skin was prepped with alcohol and ethyl chloride was sprayed at the injection site.  A 21-gauge needle was used to inject 40 mg of Depo-Medrol and 1% lidocaine (4 cc) into the right knee using an anterolateral approach.  There were no complications. A sterile bandage was applied.   Follow-up: No follow-ups on file.  Subjective:  Chief Complaint  Patient presents with   Knee Pain    Bilat knee pain L > R but the R bothering him more today, has had R KA in approx '98. Pt previously seen by [facility] but recently had insurance change.     History of Present Illness: Jon Moore is a 62 y.o. male who presents for evaluation of right knee pain.  He has issues with both knees, however his right knee is  currently bothering him.  He has previously been followed by [facility] for his knee issues.  He has not had an injection in either knee for several years.  He states he knows he has arthritis.  He is using a cane to assist with ambulation.  This allows him to stand for longer periods of time.  Pain in the right knee is diffuse.  He states he has lost over 50 pounds over the past year.  He has been taking Ozempic, which has been effective.  He has stage III kidney disease, caused by chronic hypertension.  He does have a history of an arthroscopy, completed greater than 20 years ago.   Review of Systems: No fevers or chills No numbness or tingling No chest pain No shortness of breath No bowel or bladder dysfunction No GI distress No headaches   Medical History:  Past Medical History:  Diagnosis Date   Diabetes mellitus    Frozen shoulder syndrome 2013   Right shoulder.  Has had PT.   Hypercholesterolemia    Hypertension     Past Surgical History:  Procedure Laterality Date   APPENDECTOMY     KNEE SURGERY     right    Family History  Problem Relation Age of Onset   Diabetes Mother    Heart failure Mother    Hypertension Father    Kidney  disease Father    Cancer Sister    Hodgkin's lymphoma Sister    Hypertension Brother    Hypertension Brother    Hypertension Brother    Colon cancer Neg Hx    Social History   Tobacco Use   Smoking status: Never   Smokeless tobacco: Never  Vaping Use   Vaping status: Never Used  Substance Use Topics   Alcohol use: No   Drug use: No    No Known Allergies  Current Meds  Medication Sig   allopurinol (ZYLOPRIM) 100 MG tablet Take 1 tablet by mouth once daily   amLODipine-olmesartan (AZOR) 10-40 MG tablet Take 1 tablet by mouth once daily   atorvastatin (LIPITOR) 40 MG tablet Take 1 tablet (40 mg total) by mouth daily.   carvedilol (COREG) 25 MG tablet TAKE 1 TABLET BY MOUTH TWICE DAILY WITH A MEAL   Cholecalciferol  (VITAMIN D3) 125 MCG (5000 UT) CAPS Take 1 capsule by mouth once daily   cloNIDine (CATAPRES) 0.1 MG tablet Take 1 tablet by mouth twice daily   FARXIGA 10 MG TABS tablet TAKE 1 TABLET BY MOUTH ONCE DAILY BEFORE BREAKFAST   furosemide (LASIX) 80 MG tablet Take 1 tablet by mouth twice daily   glucose blood (CONTOUR NEXT TEST) test strip USE TO CHECK SUGAR UP TO TWICE DAILY. DX E11.65   Insulin Glargine (BASAGLAR KWIKPEN) 100 UNIT/ML Inject up to max dose of 50 units daily as directed.   Insulin Pen Needle 29G X 12.7MM MISC Use to inject insulin once daily. DX: E11.9   Semaglutide, 2 MG/DOSE, (OZEMPIC, 2 MG/DOSE,) 8 MG/3ML SOPN INJECT 2 MG SUBCUTANEOUSLY ONCE A WEEK    Objective: BP 118/71   Pulse 76   Ht 6' (1.829 m)   Wt (!) 341 lb (154.7 kg)   BMI 46.25 kg/m   Physical Exam:  General: Alert and oriented. and No acute distress. Gait: Ambulates with the assistance of a cane.  Mild varus alignment of the right knee.  Varus alignment of the left knee.  Range of motion the right knee from 10-100 degrees.  Range of motion of the left knee from 15-100.  No increased laxity varus or valgus stress.  Negative Lachman.  Diffuse tenderness to palpation throughout the right knee.    IMAGING: I personally ordered and reviewed the following images  X-rays of bilateral knees were obtained in clinic today.  No acute injuries are noted.  Right knee with moderate degenerative changes overall.  Mild varus alignment.  There are associated osteophytes.  Severe degenerative changes of the left knee, with advanced varus alignment, bone-on-bone articulation, subchondral sclerosis.  Impression: Right knee x-ray with moderate to severe degenerative changes, left knee x-rays with varus alignment and severe degenerative changes.   New Medications:  No orders of the defined types were placed in this encounter.     Oliver Barre, MD  07/09/2023 9:37 AM

## 2023-07-16 ENCOUNTER — Ambulatory Visit (INDEPENDENT_AMBULATORY_CARE_PROVIDER_SITE_OTHER): Payer: 59 | Admitting: Orthopedic Surgery

## 2023-07-16 ENCOUNTER — Encounter: Payer: Self-pay | Admitting: Orthopedic Surgery

## 2023-07-16 DIAGNOSIS — M1712 Unilateral primary osteoarthritis, left knee: Secondary | ICD-10-CM | POA: Diagnosis not present

## 2023-07-16 NOTE — Patient Instructions (Signed)

## 2023-07-17 NOTE — Progress Notes (Addendum)
Return  Assessment: Jon Moore is a 62 y.o. male with the following: 1. Primary osteoarthritis of left knee  Plan: Jon Moore has pain in both knees.  Right knee injection was completed last week, and he has had a good response.  He would like to proceed with an injection in the left knee.  This was completed in clinic today without issues.  If he has any problems, he will contact the clinic.  Otherwise, follow-up as needed.  Procedure note injection Left knee joint   Verbal consent was obtained to inject the left knee joint  Timeout was completed to confirm the site of injection.  The skin was prepped with alcohol and ethyl chloride was sprayed at the injection site.  A 21-gauge needle was used to inject 40 mg of Depo-Medrol and 1% lidocaine (4 cc) into the left knee using an anterolateral approach.  There were no complications. A sterile bandage was applied.     Follow-up: Return if symptoms worsen or fail to improve.  Subjective:  Chief Complaint  Patient presents with   Knee Pain    L/ would like to get an injection.    History of Present Illness: Jon Moore is a 62 y.o. male who returns to clinic today for repeat evaluation of left knee pain.  I saw him in clinic last week, at which time he is complaining of pain in both knees, but right was worse than left.  As a result, the right knee was injected.  He states he has had a good response with the injection in the right knee, and is interested in an injection for his left knee.   Review of Systems: No fevers or chills No numbness or tingling No chest pain No shortness of breath No bowel or bladder dysfunction No GI distress No headaches   Objective: There were no vitals taken for this visit.  Physical Exam:  General: Alert and oriented. and No acute distress. Gait: Ambulates with the assistance of a cane.  Mild varus alignment of the right knee.  Varus alignment of the left knee.  Range of motion the right  knee from 10-100 degrees.  Range of motion of the left knee from 15-100.  No increased laxity varus or valgus stress.  Negative Lachman.  Diffuse tenderness to palpation throughout the right knee.    IMAGING: No new imaging obtained today   New Medications:  No orders of the defined types were placed in this encounter.     Oliver Barre, MD  07/17/2023 12:13 AM

## 2023-07-18 ENCOUNTER — Other Ambulatory Visit: Payer: Self-pay | Admitting: Family Medicine

## 2023-07-24 ENCOUNTER — Ambulatory Visit (INDEPENDENT_AMBULATORY_CARE_PROVIDER_SITE_OTHER): Payer: 59 | Admitting: *Deleted

## 2023-07-24 DIAGNOSIS — Z794 Long term (current) use of insulin: Secondary | ICD-10-CM

## 2023-07-24 DIAGNOSIS — E1165 Type 2 diabetes mellitus with hyperglycemia: Secondary | ICD-10-CM

## 2023-07-24 LAB — HM DIABETES EYE EXAM

## 2023-07-24 NOTE — Progress Notes (Signed)
Jon Moore arrived 07/24/2023 and has given verbal consent to obtain images and complete their overdue diabetic retinal screening.  The images have been sent to an ophthalmologist or optometrist for review and interpretation.  Results will be sent back to Gabriel Earing, FNP for review.  Patient has been informed they will be contacted when we receive the results via telephone or MyChart

## 2023-07-28 ENCOUNTER — Other Ambulatory Visit: Payer: Self-pay | Admitting: Family Medicine

## 2023-07-28 DIAGNOSIS — E1165 Type 2 diabetes mellitus with hyperglycemia: Secondary | ICD-10-CM

## 2023-07-28 DIAGNOSIS — N1831 Chronic kidney disease, stage 3a: Secondary | ICD-10-CM

## 2023-07-31 ENCOUNTER — Encounter: Payer: Self-pay | Admitting: Family Medicine

## 2023-08-25 ENCOUNTER — Telehealth: Payer: Self-pay | Admitting: *Deleted

## 2023-08-25 DIAGNOSIS — E1165 Type 2 diabetes mellitus with hyperglycemia: Secondary | ICD-10-CM

## 2023-08-25 MED ORDER — LANTUS SOLOSTAR 100 UNIT/ML ~~LOC~~ SOPN: PEN_INJECTOR | SUBCUTANEOUS | 99 refills | Status: AC

## 2023-08-25 NOTE — Telephone Encounter (Signed)
 Fax from [facility] pharmacy RE: Jon Moore Note - therapeutic change - no formulary Please admise

## 2023-08-25 NOTE — Addendum Note (Signed)
 Addended by: Gabriel Earing on: 08/25/2023 01:40 PM   Modules accepted: Orders

## 2023-09-12 ENCOUNTER — Other Ambulatory Visit: Payer: Self-pay | Admitting: Family Medicine

## 2023-09-12 DIAGNOSIS — E1165 Type 2 diabetes mellitus with hyperglycemia: Secondary | ICD-10-CM

## 2023-09-12 MED ORDER — OZEMPIC (2 MG/DOSE) 8 MG/3ML ~~LOC~~ SOPN: 2.0000 mg | PEN_INJECTOR | SUBCUTANEOUS | 1 refills | Status: DC

## 2023-09-12 NOTE — Telephone Encounter (Signed)
Copied from CRM [id number]. Topic: Clinical - Medication Refill >> Sep 12, 2023  9:18 AM Maxwell Marion wrote: Most Recent Primary Care Visit:  Provider: Tamera Punt  Department: [facility]  Visit Type: DIABETIC RETINAL EXAM  Date: 07/24/2023  Medication: Semaglutide, 2 MG/DOSE, (OZEMPIC, 2 MG/DOSE,  Has the patient contacted their pharmacy? Yes (Agent: If no, request that the patient contact the pharmacy for the refill. If patient does not wish to contact the pharmacy document the reason why and proceed with request.) (Agent: If yes, when and what did the pharmacy advise?) Could not fill for patient, said it was on hold  Is this the correct pharmacy for this prescription? Yes If no, delete pharmacy and type the correct one.  This is the patient's preferred pharmacy:  [facility] [address], [place] - [address] [place] [address] Phone: [phone] Fax: [phone]  [id number] [place] [place] - [address] [place] [address] Phone: [phone] Fax: [phone]   Has the prescription been filled recently? No  Is the patient out of the medication? Yes  Has the patient been seen for an appointment in the last year OR does the patient have an upcoming appointment? Yes  Can we respond through MyChart? Yes  Agent: Please be advised that Rx refills may take up to 3 business days. We ask that you follow-up with your pharmacy.

## 2023-09-15 ENCOUNTER — Encounter: Payer: Self-pay | Admitting: Family Medicine

## 2023-09-15 DIAGNOSIS — I152 Hypertension secondary to endocrine disorders: Secondary | ICD-10-CM

## 2023-09-15 DIAGNOSIS — E1165 Type 2 diabetes mellitus with hyperglycemia: Secondary | ICD-10-CM

## 2023-09-15 DIAGNOSIS — N1832 Chronic kidney disease, stage 3b: Secondary | ICD-10-CM

## 2023-09-17 ENCOUNTER — Telehealth: Payer: Self-pay

## 2023-09-17 ENCOUNTER — Other Ambulatory Visit (HOSPITAL_COMMUNITY): Payer: Self-pay

## 2023-09-17 NOTE — Telephone Encounter (Signed)
Pharmacy Patient Advocate Encounter  Received notification from [facility] that Prior Authorization for Ozempic 8mg /48ml has been APPROVED from 09/17/23 to 09/16/24. Ran test claim, Copay is $74.99 for 84 day supply. This test claim was processed through [facility]- copay amounts may vary at other pharmacies due to pharmacy/plan contracts, or as the patient moves through the different stages of their insurance plan.

## 2023-09-17 NOTE — Telephone Encounter (Signed)
Pharmacy Patient Advocate Encounter   Received notification from Patient Pharmacy that prior authorization for Ozempic 8mg /29ml is required/requested.   Insurance verification completed.   The patient is insured through [facility] .   Per test claim: PA required; PA submitted to above mentioned insurance via CoverMyMeds Key/confirmation #/EOC [id number] Status is pending

## 2023-09-17 NOTE — Telephone Encounter (Signed)
Notified patient via Mychart message. Faxed approval to patients pharmacy

## 2023-09-24 DIAGNOSIS — R81 Glycosuria: Secondary | ICD-10-CM | POA: Diagnosis not present

## 2023-09-24 DIAGNOSIS — N1832 Chronic kidney disease, stage 3b: Secondary | ICD-10-CM | POA: Diagnosis not present

## 2023-09-24 DIAGNOSIS — I129 Hypertensive chronic kidney disease with stage 1 through stage 4 chronic kidney disease, or unspecified chronic kidney disease: Secondary | ICD-10-CM | POA: Diagnosis not present

## 2023-09-24 DIAGNOSIS — E1122 Type 2 diabetes mellitus with diabetic chronic kidney disease: Secondary | ICD-10-CM | POA: Diagnosis not present

## 2023-10-01 ENCOUNTER — Telehealth: Payer: Self-pay

## 2023-10-01 ENCOUNTER — Other Ambulatory Visit: Payer: Self-pay | Admitting: Family Medicine

## 2023-10-01 DIAGNOSIS — E1165 Type 2 diabetes mellitus with hyperglycemia: Secondary | ICD-10-CM

## 2023-10-01 DIAGNOSIS — I152 Hypertension secondary to endocrine disorders: Secondary | ICD-10-CM

## 2023-10-01 MED ORDER — AMLODIPINE-OLMESARTAN 10-40 MG PO TABS: 1.0000 | ORAL_TABLET | Freq: Every day | ORAL | 3 refills | Status: DC

## 2023-10-01 NOTE — Telephone Encounter (Signed)
Copied from CRM [id number]. Topic: Clinical - Medication Refill >> Oct 01, 2023  2:14 PM Shelah Lewandowsky wrote: Most Recent Primary Care Visit:  Provider: Tamera Punt  Department: [facility]  Visit Type: DIABETIC RETINAL EXAM  Date: 07/24/2023  Medication: amLODipine-olmesartan (AZOR) 10-40 MG tablet  Has the patient contacted their pharmacy? Yes (Agent: If no, request that the patient contact the pharmacy for the refill. If patient does not wish to contact the pharmacy document the reason why and proceed with request.) (Agent: If yes, when and what did the pharmacy advise?)  Is this the correct pharmacy for this prescription? Yes If no, delete pharmacy and type the correct one.  This is the patient's preferred pharmacy:  [facility] [address], [place] - [address] [place] [address] [place] [address] Phone: [phone] Fax: [phone]   Has the prescription been filled recently? Yes  Is the patient out of the medication? No  Has the patient been seen for an appointment in the last year OR does the patient have an upcoming appointment? Yes  Can we respond through MyChart? Yes  Agent: Please be advised that Rx refills may take up to 3 business days. We ask that you follow-up with your pharmacy.

## 2023-10-01 NOTE — Telephone Encounter (Signed)
Copied from CRM [id number]. Topic: Clinical - Request for Lab/Test Order >> Oct 01, 2023  2:16 PM Shelah Lewandowsky wrote: Reason for CRM: Insurance does not cover [facility], need to get it done at [facility], please send order to [facility]

## 2023-10-01 NOTE — Telephone Encounter (Signed)
Advised pt we will write his lab orders on an Rx and he can come by and pick up to [facility]. Pt aware I will call once his lab orders are ready for pick up.

## 2023-10-01 NOTE — Telephone Encounter (Signed)
Last Fill: 06/02/23  Last OV: 04/25/23 Next OV: 10/23/23  Routing to provider for review/authorization.

## 2023-10-01 NOTE — Telephone Encounter (Signed)
This has been addressed in a telephone encounter. Will close.

## 2023-10-02 NOTE — Telephone Encounter (Signed)
Reached out to my Clinic Lead and she helped order labs in Epic through [facility]. Will sent pt a message on MyChart to let him know labs are in.

## 2023-10-02 NOTE — Addendum Note (Signed)
Addended by: Hessie Diener on: 10/02/2023 08:46 AM   Modules accepted: Orders

## 2023-10-18 LAB — HEMOGLOBIN A1C
Hgb A1c MFr Bld: 6.1 %{Hb} — ABNORMAL HIGH (ref <5.7)
Mean Plasma Glucose: 128 mg/dL
eAG (mmol/L): 7.1 mmol/L

## 2023-10-18 LAB — TSH: TSH: 2.78 m[IU]/L (ref 0.40–4.50)

## 2023-10-23 ENCOUNTER — Encounter: Payer: Self-pay | Admitting: Family Medicine

## 2023-10-23 ENCOUNTER — Ambulatory Visit: Payer: 59 | Admitting: Family Medicine

## 2023-10-23 VITALS — BP 120/73 | HR 84 | Temp 97.8°F | Ht 72.0 in | Wt 347.8 lb

## 2023-10-23 DIAGNOSIS — E1165 Type 2 diabetes mellitus with hyperglycemia: Secondary | ICD-10-CM

## 2023-10-23 DIAGNOSIS — Z7984 Long term (current) use of oral hypoglycemic drugs: Secondary | ICD-10-CM

## 2023-10-23 DIAGNOSIS — R809 Proteinuria, unspecified: Secondary | ICD-10-CM

## 2023-10-23 DIAGNOSIS — E1169 Type 2 diabetes mellitus with other specified complication: Secondary | ICD-10-CM

## 2023-10-23 DIAGNOSIS — I152 Hypertension secondary to endocrine disorders: Secondary | ICD-10-CM

## 2023-10-23 DIAGNOSIS — E785 Hyperlipidemia, unspecified: Secondary | ICD-10-CM

## 2023-10-23 DIAGNOSIS — E1129 Type 2 diabetes mellitus with other diabetic kidney complication: Secondary | ICD-10-CM

## 2023-10-23 DIAGNOSIS — N1831 Chronic kidney disease, stage 3a: Secondary | ICD-10-CM

## 2023-10-23 DIAGNOSIS — Z7985 Long-term (current) use of injectable non-insulin antidiabetic drugs: Secondary | ICD-10-CM

## 2023-10-23 DIAGNOSIS — E1159 Type 2 diabetes mellitus with other circulatory complications: Secondary | ICD-10-CM

## 2023-10-23 DIAGNOSIS — Z794 Long term (current) use of insulin: Secondary | ICD-10-CM

## 2023-10-23 MED ORDER — FUROSEMIDE 80 MG PO TABS: 80.0000 mg | ORAL_TABLET | Freq: Two times a day (BID) | ORAL | 1 refills | Status: DC

## 2023-10-23 NOTE — Assessment & Plan Note (Signed)
 Weight trending up since last visit. He hopes to increase exercise after upcoming knee injections.

## 2023-10-23 NOTE — Progress Notes (Signed)
 Established Patient Office Visit  Subjective   Patient ID: Jon Moore, male    DOB: 1961-05-02  Age: 63 y.o. MRN: [id number]  Chief Complaint  Patient presents with   Medical Management of Chronic Issues    HPI  DM Pt presents for followup evaluation of Type 2 diabetes mellitus. Patient denies foot ulcerations, increased appetite, nausea, paresthesia of the feet, polydipsia, polyuria, visual disturbances, vomiting, and weight loss.  Current diabetic medications include: ozempic 2 mg, lantus 40-50 units, farxiga 10 mg.  Compliant with meds - Yes  Current monitoring regimen: home blood tests - daily Home blood sugar records: fasting range: 80-90s Any episodes of hypoglycemia?   Eye exam current (within one year): yes Podiatry yearly?  No Weight trend: decreasing steadily Current diet: diabetic Current exercise: none  Urine microalbumin UTD? Yes  Is He on ACE inhibitor or angiotensin II receptor blocker?  Yes, olmesartan Is He on statin? Yes crestor  2. HTN Complaint with meds - Yes Current Medications - amlodipine-olmesartan 10-40 mg, coreg 25 mg BID, clonidine 0.1 mg BID, lasix 80 mg BID Pertinent ROS:  Fatigue - No Visual Disturbances - No Chest pain - No Dyspnea - No Palpitations - No  3. HLD On statin. Picked up weight over holidays. Has been walking and doing some light weights for exercise.   4. CKD On ARB, farxiga. Managed by nephrology.    Past Medical History:  Diagnosis Date   Diabetes mellitus    Frozen shoulder syndrome 2013   Right shoulder.  Has had PT.   Hypercholesterolemia    Hypertension       ROS As per HPI.    Objective:     BP 120/73   Pulse 84   Temp 97.8 F (36.6 C) (Temporal)   Ht 6' (1.829 m)   Wt (!) 347 lb 12.8 oz (157.8 kg)   SpO2 96%   BMI 47.17 kg/m  Wt Readings from Last 3 Encounters:  10/23/23 (!) 347 lb 12.8 oz (157.8 kg)  07/09/23 (!) 341 lb (154.7 kg)  04/25/23 (!) 348 lb 2 oz (157.9 kg)      Physical Exam Vitals and nursing note reviewed.  Constitutional:      General: He is not in acute distress.    Appearance: He is obese. He is not ill-appearing, toxic-appearing or diaphoretic.  HENT:     Head: Normocephalic and atraumatic.     Right Ear: Tympanic membrane, ear canal and external ear normal.     Left Ear: Tympanic membrane, ear canal and external ear normal.  Neck:     Thyroid: No thyroid mass, thyromegaly or thyroid tenderness.     Vascular: No carotid bruit.  Cardiovascular:     Rate and Rhythm: Normal rate and regular rhythm.     Heart sounds: Normal heart sounds. No murmur heard. Pulmonary:     Effort: Pulmonary effort is normal. No respiratory distress.     Breath sounds: Normal breath sounds.  Abdominal:     General: Bowel sounds are normal. There is no distension.     Palpations: Abdomen is soft.     Tenderness: There is no abdominal tenderness. There is no guarding or rebound.  Musculoskeletal:     Right lower leg: No edema.     Left lower leg: No edema.  Skin:    General: Skin is warm and dry.  Neurological:     General: No focal deficit present.     Mental Status: He is alert  and oriented to person, place, and time.     Motor: No weakness.     Gait: Gait normal.  Psychiatric:        Mood and Affect: Mood normal.        Behavior: Behavior normal.        Thought Content: Thought content normal.        Judgment: Judgment normal.      No results found for any visits on 10/23/23.  Last CBC Lab Results  Component Value Date   WBC 8.3 05/26/2023   HGB 13.3 05/26/2023   HCT 40.2 05/26/2023   MCV 81 05/26/2023   MCH 26.7 05/26/2023   RDW 14.0 05/26/2023   PLT 325 05/26/2023   Last metabolic panel Lab Results  Component Value Date   GLUCOSE 73 04/25/2023   NA 144 04/25/2023   K 4.2 04/25/2023   CL 106 04/25/2023   CO2 22 04/25/2023   BUN 27 04/25/2023   CREATININE 1.93 (H) 04/25/2023   EGFR 39 (L) 04/25/2023   CALCIUM 9.2  04/25/2023   PHOS 3.2 05/30/2021   PROT 7.7 05/26/2023   ALBUMIN 3.9 05/26/2023   LABGLOB 3.7 04/25/2023   AGRATIO 1.1 (L) 01/20/2023   BILITOT 0.4 05/26/2023   ALKPHOS 136 (H) 05/26/2023   AST 26 05/26/2023   ALT 47 (H) 05/26/2023   Last lipids Lab Results  Component Value Date   CHOL 91 (L) 04/25/2023   HDL 26 (L) 04/25/2023   LDLCALC 44 04/25/2023   TRIG 113 04/25/2023   CHOLHDL 3.5 04/25/2023   Last hemoglobin A1c Lab Results  Component Value Date   HGBA1C 6.1 (H) 10/17/2023      The ASCVD Risk score (Arnett DK, et al., 2019) failed to calculate for the following reasons:   The valid total cholesterol range is 130 to 320 mg/dL    Assessment & Plan:   Hyperlipidemia associated with type 2 diabetes mellitus (HCC) Assessment & Plan: On statin. Last LDL 44. Diet, exercise, weight loss.    Hypertension associated with diabetes [facility]) Assessment & Plan: Well controlled on current regimen.   Orders: -     Furosemide; Take 1 tablet (80 mg total) by mouth 2 (two) times daily.  Dispense: 180 tablet; Refill: 1  Long term current use of oral hypoglycemic drug  Long-term current use of injectable noninsulin antidiabetic medication  Microalbuminuria due to type 2 diabetes mellitus (HCC) Assessment & Plan: On ARB and farxiga.    Morbid obesity (HCC) Assessment & Plan: Weight trending up since last visit. He hopes to increase exercise after upcoming knee injections.    Type 2 diabetes mellitus with hyperglycemia, with long-term current use of insulin (HCC) Assessment & Plan: A1c 6.1, at goal of <7. Continue lantus 40-50 units, ozempic 2 mg, farxiga. He is on an ACE/ARB and statin.  Diet and exercise.     Stage 3a chronic kidney disease (HCC) Assessment & Plan: On ARB and farxiga. Managed by nephrology. Avoid NSAIDs.       Return in about 6 months (around 04/24/2024) for CPE with fating labs.  The patient indicates understanding of these issues and agrees  with the plan.    Gabriel Earing, FNP

## 2023-10-23 NOTE — Assessment & Plan Note (Signed)
 On statin. Last LDL 44. Diet, exercise, weight loss.

## 2023-10-23 NOTE — Assessment & Plan Note (Signed)
 On ARB and farxiga.

## 2023-10-23 NOTE — Assessment & Plan Note (Signed)
 A1c 6.1, at goal of <7. Continue lantus 40-50 units, ozempic 2 mg, farxiga. He is on an ACE/ARB and statin.  Diet and exercise.

## 2023-10-23 NOTE — Assessment & Plan Note (Signed)
 On ARB and farxiga. Managed by nephrology. Avoid NSAIDs.

## 2023-10-23 NOTE — Assessment & Plan Note (Signed)
 Well-controlled on current regimen. ?

## 2023-10-27 ENCOUNTER — Other Ambulatory Visit: Payer: Self-pay | Admitting: Family Medicine

## 2023-10-27 DIAGNOSIS — E1165 Type 2 diabetes mellitus with hyperglycemia: Secondary | ICD-10-CM

## 2023-10-27 DIAGNOSIS — N1831 Chronic kidney disease, stage 3a: Secondary | ICD-10-CM

## 2023-10-27 MED ORDER — FARXIGA 10 MG PO TABS
10.0000 mg | ORAL_TABLET | Freq: Every day | ORAL | 3 refills | Status: AC
Start: 2023-10-27 — End: ?

## 2023-11-24 ENCOUNTER — Other Ambulatory Visit: Payer: Self-pay | Admitting: Family Medicine

## 2024-03-20 ENCOUNTER — Other Ambulatory Visit: Payer: Self-pay | Admitting: Family Medicine

## 2024-03-20 DIAGNOSIS — E1169 Type 2 diabetes mellitus with other specified complication: Secondary | ICD-10-CM

## 2024-04-06 ENCOUNTER — Encounter: Payer: Self-pay | Admitting: Family Medicine

## 2024-04-06 DIAGNOSIS — E1165 Type 2 diabetes mellitus with hyperglycemia: Secondary | ICD-10-CM

## 2024-04-06 DIAGNOSIS — N1831 Chronic kidney disease, stage 3a: Secondary | ICD-10-CM

## 2024-04-06 DIAGNOSIS — E1169 Type 2 diabetes mellitus with other specified complication: Secondary | ICD-10-CM

## 2024-04-06 LAB — HM DIABETES EYE EXAM

## 2024-04-13 ENCOUNTER — Other Ambulatory Visit: Payer: Self-pay | Admitting: Family Medicine

## 2024-04-13 DIAGNOSIS — E1159 Type 2 diabetes mellitus with other circulatory complications: Secondary | ICD-10-CM

## 2024-04-24 LAB — COMPLETE METABOLIC PANEL WITHOUT GFR
AG Ratio: 1 (calc) (ref 1.0–2.5)
ALT: 37 U/L (ref 9–46)
AST: 21 U/L (ref 10–35)
Albumin: 3.9 g/dL (ref 3.6–5.1)
Alkaline phosphatase (APISO): 133 U/L (ref 35–144)
BUN/Creatinine Ratio: 23 (calc) — ABNORMAL HIGH (ref 6–22)
BUN: 56 mg/dL — ABNORMAL HIGH (ref 7–25)
CO2: 25 mmol/L (ref 20–32)
Calcium: 9.3 mg/dL (ref 8.6–10.3)
Chloride: 102 mmol/L (ref 98–110)
Creat: 2.46 mg/dL — ABNORMAL HIGH (ref 0.70–1.35)
Globulin: 3.8 g/dL — ABNORMAL HIGH (ref 1.9–3.7)
Glucose, Bld: 74 mg/dL (ref 65–99)
Potassium: 4.5 mmol/L (ref 3.5–5.3)
Sodium: 136 mmol/L (ref 135–146)
Total Bilirubin: 0.5 mg/dL (ref 0.2–1.2)
Total Protein: 7.7 g/dL (ref 6.1–8.1)

## 2024-04-24 LAB — LIPID PANEL
Cholesterol: 93 mg/dL (ref <200)
HDL: 24 mg/dL — ABNORMAL LOW (ref >=40)
LDL Cholesterol (Calc): 45 mg/dL
Non-HDL Cholesterol (Calc): 69 mg/dL (ref <130)
Total CHOL/HDL Ratio: 3.9 (calc) (ref <5.0)
Triglycerides: 159 mg/dL — ABNORMAL HIGH (ref <150)

## 2024-04-24 LAB — HEMOGLOBIN A1C
Hgb A1c MFr Bld: 6 % — ABNORMAL HIGH (ref <5.7)
Mean Plasma Glucose: 126 mg/dL
eAG (mmol/L): 7 mmol/L

## 2024-04-25 ENCOUNTER — Other Ambulatory Visit: Payer: Self-pay | Admitting: Family Medicine

## 2024-04-30 ENCOUNTER — Ambulatory Visit: Payer: Self-pay | Admitting: Family Medicine

## 2024-04-30 ENCOUNTER — Encounter: Payer: Self-pay | Admitting: Family Medicine

## 2024-04-30 ENCOUNTER — Ambulatory Visit (INDEPENDENT_AMBULATORY_CARE_PROVIDER_SITE_OTHER): Admitting: Family Medicine

## 2024-04-30 VITALS — BP 93/56 | HR 64 | Temp 97.9°F | Wt 352.4 lb

## 2024-04-30 DIAGNOSIS — R809 Proteinuria, unspecified: Secondary | ICD-10-CM

## 2024-04-30 DIAGNOSIS — Z794 Long term (current) use of insulin: Secondary | ICD-10-CM

## 2024-04-30 DIAGNOSIS — Z7985 Long-term (current) use of injectable non-insulin antidiabetic drugs: Secondary | ICD-10-CM | POA: Diagnosis not present

## 2024-04-30 DIAGNOSIS — E1159 Type 2 diabetes mellitus with other circulatory complications: Secondary | ICD-10-CM

## 2024-04-30 DIAGNOSIS — E1169 Type 2 diabetes mellitus with other specified complication: Secondary | ICD-10-CM

## 2024-04-30 DIAGNOSIS — E1129 Type 2 diabetes mellitus with other diabetic kidney complication: Secondary | ICD-10-CM

## 2024-04-30 DIAGNOSIS — E785 Hyperlipidemia, unspecified: Secondary | ICD-10-CM

## 2024-04-30 DIAGNOSIS — I152 Hypertension secondary to endocrine disorders: Secondary | ICD-10-CM

## 2024-04-30 DIAGNOSIS — E1165 Type 2 diabetes mellitus with hyperglycemia: Secondary | ICD-10-CM

## 2024-04-30 DIAGNOSIS — Z7984 Long term (current) use of oral hypoglycemic drugs: Secondary | ICD-10-CM

## 2024-04-30 DIAGNOSIS — N1832 Chronic kidney disease, stage 3b: Secondary | ICD-10-CM

## 2024-04-30 NOTE — Progress Notes (Signed)
 Established Patient Office Visit  Subjective   Patient ID: Jon Moore, male    DOB: February 08, 1961  Age: 63 y.o. MRN: [id number]  Chief Complaint  Patient presents with   Annual Exam    HPI  History of Present Illness   Jon Moore is a 63 year old male with type 2 diabetes and chronic kidney disease who presents for routine follow-up.  Glycemic control - Type 2 diabetes managed with Lantus  40 units, Farxiga , and Ozempic  2 mg - Blood glucose levels stable, ranging between 75 and 90 mg/dL fasting - No hypoglycemic episodes  Renal function and electrolyte management - Chronic kidney disease with stable creatinine levels - Established with nephrology - On ARB and farxiga  - Slight decrease in hemoglobin, likely related to kidney disease  Hypertension - Blood pressure consistently around 115/78 mmHg at home - No dizziness or lightheadedness - Current antihypertensive medications include amlodipine , olmesartan , lasix , spironolactone  Gout - No gout flare-ups in the past five to six months  Physical activity and mobility - Increased physical activity by walking more - No longer requires a cane  Cardiopulmonary symptoms - No chest pain or shortness of breath  Dietary habits - Diet has been inconsistent due to recent celebrations        ROS As per HPI.    Objective:     BP (!) 93/56   Pulse 64   Temp 97.9 F (36.6 C) (Temporal)   Wt (!) 352 lb 6.4 oz (159.8 kg)   SpO2 97%   BMI 47.79 kg/m  Wt Readings from Last 3 Encounters:  04/30/24 (!) 352 lb 6.4 oz (159.8 kg)  10/23/23 (!) 347 lb 12.8 oz (157.8 kg)  07/09/23 (!) 341 lb (154.7 kg)      Physical Exam Vitals and nursing note reviewed.  Constitutional:      General: He is not in acute distress.    Appearance: He is obese. He is not ill-appearing, toxic-appearing or diaphoretic.  Cardiovascular:     Rate and Rhythm: Normal rate and regular rhythm.     Pulses: Normal pulses.     Heart sounds: Normal  heart sounds. No murmur heard. Pulmonary:     Effort: Pulmonary effort is normal. No respiratory distress.     Breath sounds: Normal breath sounds.  Abdominal:     General: Bowel sounds are normal. There is no distension.     Palpations: Abdomen is soft. There is no mass.     Tenderness: There is no abdominal tenderness. There is no guarding or rebound.  Musculoskeletal:     Cervical back: Neck supple. No tenderness.     Right lower leg: No edema.     Left lower leg: No edema.  Lymphadenopathy:     Cervical: No cervical adenopathy.  Skin:    General: Skin is warm and dry.  Neurological:     General: No focal deficit present.     Mental Status: He is alert and oriented to person, place, and time.  Psychiatric:        Mood and Affect: Mood normal.        Behavior: Behavior normal.      No results found for any visits on 04/30/24.    The ASCVD Risk score (Arnett DK, et al., 2019) failed to calculate for the following reasons:   The valid total cholesterol range is 130 to 320 mg/dL    Assessment & Plan:   Jon Moore was seen today for annual exam.  Diagnoses  and all orders for this visit:  Type 2 diabetes mellitus with hyperglycemia, with long-term current use of insulin  (HCC)  Long term current use of oral hypoglycemic drug  Long-term current use of injectable noninsulin antidiabetic medication  Hypertension associated with diabetes (HCC)  Hyperlipidemia associated with type 2 diabetes mellitus (HCC)  Microalbuminuria due to type 2 diabetes mellitus (HCC)  Stage 3b chronic kidney disease (HCC)  Morbid obesity (HCC)      Type 2 diabetes mellitus Diabetes well-controlled with A1c 6.0%. Stable blood sugars, no hypoglycemia. Current medications also aid in managing chronic kidney disease. - Continue Lantus , Farxiga , and Ozempic  as prescribed. - Encourage adherence to diet and exercise regimen.  Chronic kidney disease, stage 3b with microalbuminuria Continue  olmesartan  and farxiga  - Follow up with nephrologist in February.  Hypertension Stable blood pressure, no dizziness or lightheadedness. - Continue current antihypertensive regimen.  Dyslipidemia Triglycerides slightly elevated, LDL good, HDL low. Dietary improvements expected to help. - Encourage dietary modifications to improve lipid profile. - Continue statin  Morbid obesity Morbid obesity addressed with lifestyle modifications.  - Encourage weight loss through diet and exercise. - Advise reducing fried foods and increasing intake of lean meats, fruits, vegetables, and whole grains.      Return in about 6 months (around 10/28/2024) for CPE.   The patient indicates understanding of these issues and agrees with the plan.  Jon Moore Search, FNP

## 2024-05-05 ENCOUNTER — Other Ambulatory Visit: Payer: Self-pay | Admitting: Family Medicine

## 2024-05-22 ENCOUNTER — Other Ambulatory Visit: Payer: Self-pay | Admitting: Family Medicine

## 2024-05-22 DIAGNOSIS — E1165 Type 2 diabetes mellitus with hyperglycemia: Secondary | ICD-10-CM

## 2024-06-17 ENCOUNTER — Other Ambulatory Visit: Payer: Self-pay | Admitting: Family Medicine

## 2024-06-17 DIAGNOSIS — E1169 Type 2 diabetes mellitus with other specified complication: Secondary | ICD-10-CM

## 2024-07-07 ENCOUNTER — Other Ambulatory Visit: Payer: Self-pay | Admitting: Family Medicine

## 2024-07-07 DIAGNOSIS — I152 Hypertension secondary to endocrine disorders: Secondary | ICD-10-CM

## 2024-07-07 DIAGNOSIS — E1159 Type 2 diabetes mellitus with other circulatory complications: Secondary | ICD-10-CM

## 2024-07-17 ENCOUNTER — Other Ambulatory Visit: Payer: Self-pay | Admitting: Family Medicine

## 2024-07-17 DIAGNOSIS — E1159 Type 2 diabetes mellitus with other circulatory complications: Secondary | ICD-10-CM

## 2024-07-28 ENCOUNTER — Other Ambulatory Visit: Payer: Self-pay | Admitting: Family Medicine

## 2024-07-28 DIAGNOSIS — E1165 Type 2 diabetes mellitus with hyperglycemia: Secondary | ICD-10-CM

## 2024-07-29 NOTE — Telephone Encounter (Signed)
 Tiffany NTBS in March for 6 mos FU RF sent to pharmacy

## 2024-07-29 NOTE — Telephone Encounter (Signed)
 Appt scheduled for 10/28/2024

## 2024-08-21 ENCOUNTER — Other Ambulatory Visit: Payer: Self-pay | Admitting: Family Medicine

## 2024-09-01 ENCOUNTER — Telehealth: Payer: Self-pay

## 2024-09-01 ENCOUNTER — Other Ambulatory Visit (HOSPITAL_COMMUNITY): Payer: Self-pay

## 2024-09-01 ENCOUNTER — Encounter: Payer: Self-pay | Admitting: Family Medicine

## 2024-09-01 NOTE — Telephone Encounter (Signed)
 Pharmacy Patient Advocate Encounter   Received notification from [employer] CMM KEY/ [facility] MESSAGE that prior authorization for OZEMPIC  2MG  DOSE is required/requested.   Insurance verification completed.   The patient is insured through [employer]   Per test claim: PA required; PA started via CoverMyMeds. KEY [id number] . Waiting for clinical questions to populate.

## 2024-09-02 NOTE — Telephone Encounter (Signed)
 Pharmacy Patient Advocate Encounter   PA required; PA submitted to above mentioned insurance via Latent Key/confirmation #/EOC [id number]. Status is pending

## 2024-09-06 NOTE — Telephone Encounter (Signed)
 Pharmacy Patient Advocate Encounter  Received notification from [facility] [employer]) that Prior Authorization for OZEMPIC  2MG  DOSE PENS has been APPROVED from 09/03/24 to 09/03/25   PA #/Case ID/Reference #: [id number]

## 2024-09-14 ENCOUNTER — Other Ambulatory Visit: Payer: Self-pay | Admitting: Family Medicine

## 2024-09-17 ENCOUNTER — Other Ambulatory Visit: Payer: Self-pay | Admitting: Family Medicine

## 2024-09-17 DIAGNOSIS — E1169 Type 2 diabetes mellitus with other specified complication: Secondary | ICD-10-CM

## 2024-10-28 ENCOUNTER — Ambulatory Visit: Payer: Self-pay | Admitting: Family Medicine
